# Patient Record
Sex: Female | Born: 1965
Health system: Southern US, Community
[De-identification: ages and names within clinical notes are randomized; demographics above are authoritative.]

## PROBLEM LIST (undated history)

## (undated) ENCOUNTER — Emergency Department (HOSPITAL_COMMUNITY): Discharge status: Absent without leave | Payer: Self-pay

## (undated) DIAGNOSIS — E119 Type 2 diabetes mellitus without complications: Secondary | ICD-10-CM

## (undated) DIAGNOSIS — T7840XA Allergy, unspecified, initial encounter: Secondary | ICD-10-CM

## (undated) DIAGNOSIS — F419 Anxiety disorder, unspecified: Secondary | ICD-10-CM

## (undated) DIAGNOSIS — F32A Depression, unspecified: Secondary | ICD-10-CM

## (undated) DIAGNOSIS — J45909 Unspecified asthma, uncomplicated: Secondary | ICD-10-CM

## (undated) DIAGNOSIS — F329 Major depressive disorder, single episode, unspecified: Secondary | ICD-10-CM

## (undated) HISTORY — DX: Allergy, unspecified, initial encounter: T78.40XA

## (undated) HISTORY — PX: TUBAL LIGATION: SHX77

## (undated) HISTORY — DX: Unspecified asthma, uncomplicated: J45.909

## (undated) HISTORY — DX: Depression, unspecified: F32.A

## (undated) HISTORY — PX: CHOLECYSTECTOMY: SHX55

## (undated) HISTORY — DX: Major depressive disorder, single episode, unspecified: F32.9

## (undated) HISTORY — DX: Anxiety disorder, unspecified: F41.9

---

## 1998-05-28 ENCOUNTER — Encounter: Admission: RE | Admit: 1998-05-28 | Discharge: 1998-05-28 | Payer: Self-pay | Admitting: *Deleted

## 1998-07-02 ENCOUNTER — Encounter: Admission: RE | Admit: 1998-07-02 | Discharge: 1998-07-02 | Payer: Self-pay | Admitting: *Deleted

## 2000-10-29 ENCOUNTER — Encounter: Payer: Self-pay | Admitting: Emergency Medicine

## 2000-10-29 ENCOUNTER — Emergency Department (HOSPITAL_COMMUNITY): Admission: EM | Admit: 2000-10-29 | Discharge: 2000-10-29 | Payer: Self-pay | Admitting: Emergency Medicine

## 2001-01-19 ENCOUNTER — Encounter: Payer: Self-pay | Admitting: Gastroenterology

## 2001-01-19 ENCOUNTER — Ambulatory Visit (HOSPITAL_COMMUNITY): Admission: RE | Admit: 2001-01-19 | Discharge: 2001-01-19 | Payer: Self-pay | Admitting: Gastroenterology

## 2001-02-25 ENCOUNTER — Encounter: Payer: Self-pay | Admitting: Anesthesiology

## 2001-02-28 ENCOUNTER — Encounter (INDEPENDENT_AMBULATORY_CARE_PROVIDER_SITE_OTHER): Payer: Self-pay

## 2001-02-28 ENCOUNTER — Observation Stay (HOSPITAL_COMMUNITY): Admission: RE | Admit: 2001-02-28 | Discharge: 2001-02-28 | Payer: Self-pay | Admitting: General Surgery

## 2001-02-28 ENCOUNTER — Encounter: Payer: Self-pay | Admitting: General Surgery

## 2005-05-02 ENCOUNTER — Ambulatory Visit: Payer: Self-pay | Admitting: Infectious Diseases

## 2005-05-02 ENCOUNTER — Inpatient Hospital Stay (HOSPITAL_COMMUNITY): Admission: AD | Admit: 2005-05-02 | Discharge: 2005-05-11 | Payer: Self-pay | Admitting: Orthopedic Surgery

## 2009-09-30 ENCOUNTER — Emergency Department (HOSPITAL_COMMUNITY): Admission: EM | Admit: 2009-09-30 | Discharge: 2009-09-30 | Payer: Self-pay | Admitting: Emergency Medicine

## 2010-10-16 ENCOUNTER — Emergency Department (HOSPITAL_COMMUNITY)
Admission: EM | Admit: 2010-10-16 | Discharge: 2010-10-16 | Disposition: A | Discharge status: Home / Self Care | Payer: Self-pay | Attending: Emergency Medicine | Admitting: Emergency Medicine

## 2010-10-16 DIAGNOSIS — N61 Mastitis without abscess: Secondary | ICD-10-CM | POA: Insufficient documentation

## 2010-10-16 DIAGNOSIS — F329 Major depressive disorder, single episode, unspecified: Secondary | ICD-10-CM | POA: Insufficient documentation

## 2010-10-16 DIAGNOSIS — F3289 Other specified depressive episodes: Secondary | ICD-10-CM | POA: Insufficient documentation

## 2010-10-16 DIAGNOSIS — Z79899 Other long term (current) drug therapy: Secondary | ICD-10-CM | POA: Insufficient documentation

## 2010-10-16 DIAGNOSIS — E669 Obesity, unspecified: Secondary | ICD-10-CM | POA: Insufficient documentation

## 2010-10-16 LAB — DIFFERENTIAL
Basophils Absolute: 0 10*3/uL (ref 0.0–0.1)
Basophils Relative: 0 % (ref 0–1)
Eosinophils Absolute: 0.3 10*3/uL (ref 0.0–0.7)
Eosinophils Relative: 3 % (ref 0–5)
Lymphocytes Relative: 28 % (ref 12–46)
Lymphs Abs: 2.9 10*3/uL (ref 0.7–4.0)
Monocytes Absolute: 0.5 10*3/uL (ref 0.1–1.0)
Monocytes Relative: 5 % (ref 3–12)
Neutro Abs: 6.5 10*3/uL (ref 1.7–7.7)
Neutrophils Relative %: 64 % (ref 43–77)
Smear Review: ADEQUATE

## 2010-10-16 LAB — CBC
HCT: 46.2 % — ABNORMAL HIGH (ref 36.0–46.0)
Hemoglobin: 16.3 g/dL — ABNORMAL HIGH (ref 12.0–15.0)
MCV: 87.8 fL (ref 78.0–100.0)
RDW: 12.9 % (ref 11.5–15.5)
WBC: 10.2 10*3/uL (ref 4.0–10.5)

## 2010-11-06 LAB — BASIC METABOLIC PANEL
BUN: 13 mg/dL (ref 6–23)
CO2: 25 mEq/L (ref 19–32)
Chloride: 105 mEq/L (ref 96–112)
GFR calc non Af Amer: >60 mL/min (ref >60)
Glucose, Bld: 114 mg/dL — ABNORMAL HIGH (ref 70–99)
Potassium: 3.8 mEq/L (ref 3.5–5.1)

## 2010-11-06 LAB — CBC
HCT: 47.1 % — ABNORMAL HIGH (ref 36.0–46.0)
MCHC: 34.5 g/dL (ref 30.0–36.0)
MCV: 91.1 fL (ref 78.0–100.0)
Platelets: 155 10*3/uL (ref 150–400)
RDW: 13 % (ref 11.5–15.5)

## 2010-11-06 LAB — WET PREP, GENITAL
Clue Cells Wet Prep HPF POC: NONE SEEN
Trich, Wet Prep: NONE SEEN
WBC, Wet Prep HPF POC: NONE SEEN

## 2010-11-06 LAB — DIFFERENTIAL
Basophils Absolute: 0.1 10*3/uL (ref 0.0–0.1)
Basophils Relative: 1 % (ref 0–1)
Eosinophils Absolute: 0.2 10*3/uL (ref 0.0–0.7)
Eosinophils Relative: 2 % (ref 0–5)
Monocytes Absolute: 0.5 10*3/uL (ref 0.1–1.0)

## 2010-11-06 LAB — HCG, SERUM, QUALITATIVE: Preg, Serum: NEGATIVE

## 2011-01-02 NOTE — Discharge Summary (Signed)
[name], [name]                ACCOUNT NO.:  [id number]   MEDICAL RECORD NO.:  [id number]          PATIENT TYPE:  INP   LOCATION:  1507                         FACILITY:  [facility]   PHYSICIAN:  Almedia Balls. Ranell Patrick, M.D. DATE OF BIRTH:  1965-09-02   DATE OF ADMISSION:  05/02/2005  DATE OF DISCHARGE:                                 DISCHARGE SUMMARY   ADMISSION DIAGNOSIS:  Right fifth toe cellulitis.   DISCHARGE DIAGNOSIS:  Right fifth toe cellulitis secondary to methicillin-  resistant staphylococcus aureus, status post incision and drainage.   BRIEF HISTORY:  Patient is a 45 year old female who came to our office  during an acute care clinic complaining about right foot pain, status post  injury.  It has been going on and worsening over the last several days.  The  patient has been put on Keflex and states that her symptoms have been  worsening.  She was offered to be admitted at that point, but she elected to  try home antibiotics at first.  She returned the next day to be admitted to  the hospital for worsening symptoms, where she had an I&D on follow-up  admission.   PROCEDURE:  Patient had an I&D of the right fifth toe on May 02, 2005.  The attending surgeon was Malon Kindle, M.D.  Spinal anesthesia was used.  She had no complications.  Estimated blood loss was minimal.  Fluid  replacement was 500 cc of crystalloid, and she did tolerate the procedure  well.  Cultures were taken.   HOSPITAL COURSE:  The patient presented to the hospital for worsening  symptoms, even on antibiotics, for her right fifth toe.  Patient was taken  to have the above-stated procedure on May 02, 2005.  Patient tolerated  the procedure well and after an adequate time in the post anesthesia care  unit, she was transferred up to the orthopedic floor.   On postoperative day #1, the patient complained about severe pain in the  right foot.  Patient was started on Unasyn to begin with for  broad-spectrum  activity.  We did monitor this for a day and a half, and the patient still  remained with erythema and edema in her forefoot, extending back down  towards her ankle, it was not resolving, even after the I&D.  Infectious  disease consult.  Dr. Roxan Hockey graciously consulted and also stated the  patient had a MRSA infection and for [place] to start her on vancomycin.  This  was completed.  Two days after starting on the vancomycin, she really did  not have much improvement.  Thus, we had a wound consult and whirlpool for  that right foot two days afterwards, which patient started noticing moderate  results, decrease in erythema and edema in that right foot.  The pain was  well controlled throughout her entire visit, and she was afebrile.  Her  white blood count stayed within normal limits, although her sed rate and C-  reactive protein were elevated slightly during her visit.   On September 25th, the patient had IV antibiotics for several days,  and  dressing change.  It was noted that she had no drainage.  Her foot had no  erythema or edema.  Neurovascularly, she was intact.  Thus, it was decided  to discharge home on doxycycline per the recommendations of Dr. Roxan Hockey.   DISCHARGE PLAN:  Patient will be discharged home on May 11, 2005.  Her  condition is stable.  Diet is regular.   Patient does have an allergy to [facility].   DISCHARGE MEDICATIONS:  1.  Singulair 10 mg p.o. daily.  2.  Claritin 10 mg p.o. daily.  3.  Tagamet 300 mg b.i.d. p.o.  4.  Zoloft 50 mg p.o. daily.  5.  Doxycycline 100 mg p.o. b.i.d.  6.  Percocet 1-2 tabs q.4-6h. p.r.n. pain.  7.  Periactin 4 mg t.i.d. p.r.n.   CONDITION:  Stable.   ACTIVITY:  Nonweightbearing on that right lower extremity with the walker.   She is to have home health nursing for dressing changes.      Thomas B. Dixon, P.A.    ______________________________  Almedia Balls. Ranell Patrick, M.D.    TBD/MEDQ  D:  05/11/2005  T:   05/11/2005  Job:  [id number]   cc:   Rockey Situ. Flavia Shipper., M.D.  Fax: [phone]

## 2011-01-02 NOTE — Op Note (Signed)
[name], [name]                ACCOUNT NO.:  [id number]   MEDICAL RECORD NO.:  [id number]          PATIENT TYPE:  INP   LOCATION:  1507                         [facility]ACILITY:  [facility]   PHYSICIAN:  Almedia Balls. Ranell Patrick, M.D. DATE OF BIRTH:  1966-06-04   DATE OF PROCEDURE:  05/02/2005  DATE OF DISCHARGE:                                 OPERATIVE REPORT   PREOPERATIVE DIAGNOSIS:  Right small toe infection.   POSTOPERATIVE DIAGNOSIS:  Right small toe infection.   PROCEDURE:  I&D right small toe.   SURGEON:  Almedia Balls. Ranell Patrick, M.D.   ASSISTANT:  None.   ANESTHESIA:  Spinal anesthesia was used.   ESTIMATED BLOOD LOSS:  Minimal.   FLUIDS REPLACED:  500 mL crystalloid.   INSTRUMENT COUNT:  Correct.   COMPLICATIONS:  None.   ANTIBIOTICS:  Perioperative antibiotics were given.   INDICATIONS FOR PROCEDURE:  The patient is a 45 year old female with a  progressive right small toe infection since stubbing her toe several days  ago. The patient presented to orthopedics with questionable bloody blister  versus an infection of the right small toe. She was placed on oral  antibiotics and placed nonweightbearing yesterday. She presents today,  September 16, with progressive erythema and infection which has failed to  respond to conservative measures including oral antibiotics and  immobilization. The patient presents now for operative I&D. Informed consent  was obtained.   DESCRIPTION OF PROCEDURE:  After an adequate level of anesthesia was  achieved, the patient was positioned supine on the operating table, the  right hip was bumped. The right lower leg was sterilely prepped and draped  in the usual fashion. We removed the overlying epidermis which had a fair  amount of pus underneath that over the small toe, this was on the outside  aspect of the small toe including the dorsal skin over to the toenail. Again  there was copious amounts of purulent material which was in a bolus pocket  which  appeared to be superficial to the dermis or deep skin. We cultured  that for aerobic, anaerobic and Gram stain. We thoroughly debrided any  nonviable tissue. What was left appeared to be hyperemic dermal tissue and a  healthy looking nail bed. We thoroughly irrigated the  wound with 500 mL of normal saline in a pulsatile fashion. Next, we dressed  the wound utilizing nonstick xeroform gauze followed by placement of 4 x 4's  in between each toe and then a bulky Kerlix around the small toe. A short  leg splint was placed, the patient was awakened and taken to the recovery  room in stable condition.           ______________________________  Almedia Balls Ranell Patrick, M.D.     SRN/MEDQ  D:  05/02/2005  T:  05/04/2005  Job:  [id number]

## 2011-01-02 NOTE — Op Note (Signed)
[place]  Patient:    [name]                       MRN: [id number] Proc. Date: 02/28/01 Adm. Date:  [id number] Disc. Date: [id number] Attending:  Glenna Fellows Tappan                           Operative Report  PREOPERATIVE DIAGNOSIS:  Biliary dyskinesia.  POSTOPERATIVE DIAGNOSIS:  Biliary dyskinesia.  OPERATION PERFORMED:  Laparoscopic cholecystectomy with intraoperative cholangiogram.  SURGEON:  Sharlet Salina T. Hoxworth, M.D.  ASSISTANT:  Donnie Coffin. Samuella Cota, M.D.  ANESTHESIA:  General.  INDICATIONS FOR PROCEDURE:  [name] is a 45 year old white female with persistent episodic epigastric and right upper quadrant abdominal pain.  She has had a thorough work-up including essentially esophagogastroduodenoscopy and gallbladder ultrasound.  Her pain, however, has continued and HIDA scan has shown a markedly reduced ejection fraction of 19%.  She is felt to likely have biliary dyskinesia and after discussion, we have elected to proceed with laparoscopic cholecystectomy with cholangiogram.  The nature of the procedure, its indications and risks of bleeding, infection, bile leak and bile duct injury and failure to relieve her symptoms were discussed and understood.  The patient is now brought to the operating room for this procedure.  DESCRIPTION OF PROCEDURE:  The patient was brought to the operating room and placed in supine position on the operating table and general endotracheal anesthesia was induced.  The abdomen was sterilely prepped and draped.  She received preoperative antibiotics.  Local anesthesia was used to infiltrate the trocar sites prior to the incisions.  A 1 cm incision was made at the umbilicus and dissection carried down to the midline fascia.  This was sharply incised for 1 cm and the peritoneum entered under direct vision.  A mattress suture of 0 Vicryl was placed and the Hasson trocar inserted.  A pneumoperitoneum  established.  Under direct vision, a 10 mm trocar was placed in the subxiphoid area and two 5 mm trocars along the right subcostal margin. The gallbladder was visualized and the fundus elevated up over the liver and the infundibulum retracted inferolaterally.  Fibrofatty tissue was stripped down off the neck of the gallbladder.  The distal gallbladder and Calots triangle were thoroughly dissected.  The cystic duct gallbladder junction was identified and dissected 360 degrees.  The cystic duct was clipped at the gallbladder junction.  Operative cholangiograms were then obtained through the cystic duct.  This showed good filling of normal intrahepatic, common hepatic and common bile ducts with free flow into the duodenum and no filling defects. Following this, the Cholangiocath was removed and the cystic duct was doubly clipped proximally and divided.  The cystic artery Calots triangle was divided between two proximal and one distal clip.  The gallbladder was then dissected free from its bed using hook cautery and removed intact through the umbilicus.  Complete hemostasis was assured in the gallbladder bed.  The right upper quadrant was irrigated and suctioned until clear.  Trocars were removed under direct vision and all CO2 evacuated from the peritoneal cavity.  The pursestring suture was secured at the umbilicus.  Skin incisions were closed with interrupted subcuticular 4-0 Monocryl and Steri-Strips.  Sponge, needle and instrument counts were correct.  Dry sterile dressings were applied.  The patient was taken to the recovery room in good condition. DD:  02/28/01 TD:  02/28/01  Job: [id number]

## 2011-08-31 ENCOUNTER — Ambulatory Visit (INDEPENDENT_AMBULATORY_CARE_PROVIDER_SITE_OTHER): Payer: Self-pay

## 2011-08-31 DIAGNOSIS — R059 Cough, unspecified: Secondary | ICD-10-CM

## 2011-08-31 DIAGNOSIS — R509 Fever, unspecified: Secondary | ICD-10-CM

## 2011-08-31 DIAGNOSIS — R05 Cough: Secondary | ICD-10-CM

## 2011-09-03 ENCOUNTER — Ambulatory Visit: Payer: Self-pay

## 2011-10-13 ENCOUNTER — Ambulatory Visit: Payer: Self-pay | Admitting: Internal Medicine

## 2011-10-13 ENCOUNTER — Ambulatory Visit: Payer: Self-pay

## 2011-10-13 VITALS — BP 133/83 | HR 90 | Temp 98.0°F | Resp 18 | Ht 65.5 in | Wt 244.0 lb

## 2011-10-13 DIAGNOSIS — R059 Cough, unspecified: Secondary | ICD-10-CM

## 2011-10-13 DIAGNOSIS — J9801 Acute bronchospasm: Secondary | ICD-10-CM

## 2011-10-13 DIAGNOSIS — T783XXA Angioneurotic edema, initial encounter: Secondary | ICD-10-CM

## 2011-10-13 DIAGNOSIS — Z720 Tobacco use: Secondary | ICD-10-CM

## 2011-10-13 DIAGNOSIS — R05 Cough: Secondary | ICD-10-CM

## 2011-10-13 LAB — POCT CBC
Granulocyte percent: 70.7 %G (ref 37–80)
HCT, POC: 47.5 % (ref 37.7–47.9)
Hemoglobin: 15.7 g/dL (ref 12.2–16.2)
Lymph, poc: 2.6 (ref 0.6–3.4)
MCHC: 33.1 g/dL (ref 31.8–35.4)
MCV: 91.1 fL (ref 80–97)
POC LYMPH PERCENT: 22.8 %L (ref 10–50)
RDW, POC: 14 %

## 2011-10-13 LAB — POCT SEDIMENTATION RATE: POCT SED RATE: 20 mm/hr (ref 0–22)

## 2011-10-13 MED ORDER — MOMETASONE FURO-FORMOTEROL FUM 100-5 MCG/ACT IN AERO: 2.0000 | INHALATION_SPRAY | Freq: Two times a day (BID) | RESPIRATORY_TRACT | Status: DC

## 2011-10-13 MED ORDER — METHYLPREDNISOLONE ACETATE 40 MG/ML IJ SUSP
80.0000 mg | Freq: Once | INTRAMUSCULAR | Status: AC
  Administered 2011-10-13: 80 mg via INTRAMUSCULAR

## 2011-10-13 MED ORDER — PREDNISONE 20 MG PO TABS: ORAL_TABLET | ORAL | Status: DC

## 2011-10-13 MED ORDER — EPINEPHRINE 0.3 MG/0.3ML IJ DEVI
0.3000 mg | Freq: Once | INTRAMUSCULAR | Status: AC
  Administered 2011-10-13: 0.3 mg via INTRAMUSCULAR

## 2011-10-13 MED ORDER — NON FORMULARY
10.0000 mg | Freq: Once | Status: AC
  Administered 2011-10-13: 10 mg via ORAL

## 2011-10-13 NOTE — Progress Notes (Signed)
  Subjective:    Patient ID: [name], female    DOB: November 15, 1965, 46 y.o.   MRN: [id number]  HPI46 year old with a history of labial and facial swelling over 24-36 hours There is no urticaria or pruritus Over the last few hours she has felt a tightness in her throat on swallowing but no respiratory distress There is no past history of this although she has had a history of urticaria There is no history of angioedema in the family that she is aware of She has had no unusual abdominal pain syndromes over the years There are no new medicines/  recent illnesses= She was treated for suspected right lower lobe pneumonia 5 weeks ago with doxycycline and responded fairly well. The radiologist subsequently interpreted the chest x-ray as no acute illness/Her husband reports continued cough but says she coughs all the time because she is a smoker and there is a long history of this cough/she also has had some problems with wheezing cough secondary to smoking //she is not on antihypertensives And there is no change in her current meds  Past history:  hepatitis C treated and resolved in 2006 History of asthma as a child with reactive airway as an adult History of IBS/status post cholecystectomy History of allergic rhinitis History of urticaria of uncertain etiology Obesity GERD Anxiety and depression    Review of SystemsHer cardiovascular system is negative GI negative GU negative      Objective:   Physical Exam Vital signs are within normal limits except for elevated BMI There is no tachypnea or increased work of breathing Her eyes are clear The face is obviously swollen and indurated and somewhat asymmetrical fashion/both lips are swollen and the left forehead and malar area are also swollen There are no anterior cervical nodes or thyroid enlargement There is mild expiratory forced expiratory wheezing bilaterally with rhonchi heard anteriorly There is no stridor The tongue and  posterior pharynx are not swollen Heart has a regular rhythm without murmurs rubs or gallops and the rate is normal The rest of the skin is clear without evidence of high dose or other rash   EpiPen 0.3 mg was given and re-observation 15 minutes showed a partial response She continues to have forced expiratory wheezing Depo-Medrol 80 mg IM given  Chest x-ray shows no active infiltrate or other abnormality CBC is 11,200 white cells  Recheck at 45 minutes post epinephrine shows response in the lips and forehead The left malar area remains tense and slightly tender and swollen       Assessment & Plan:  Problem #1 angioedema With a partial response to epinephrine it is difficult to say whether this is mast cell-mediated allergic reaction or whether it is bradykinin mediated reaction  Problem #2 reactive airway disease secondary to smoking with a history of underlying allergic disorders including asthma  She will Use dulera 100/5 2 puffs twice a day with coupon She will continue Zyrtec or similar She will start prednisone 60 mg for 2 days 40 mg for 2 days and 20 mg for 2 days She was warned about the insidious nature of nonallergic angioedema and the need for intravenous treatment if this progresses Pending labs include sedimentation rate, CRP, C3 and C4, C1 esterase inhibitor, and CMET She has alprazolam to use for the jitteriness caused by her medication Smoking cessation does not appear to be an option at this point  Closely guarded status needed

## 2011-10-13 NOTE — Progress Notes (Signed)
  Subjective:    Patient ID: [name], female    DOB: Dec 28, 1965, 46 y.o.   MRN: [id number]  HPI    Review of Systems     Objective:   Physical Exam    [facility] reading (PRIMARY) by  Dr.Jasiah Buntin== No acute changes     Assessment & Plan:

## 2011-10-14 LAB — COMPREHENSIVE METABOLIC PANEL
ALT: 10 U/L (ref 0–35)
CO2: 23 mEq/L (ref 19–32)
Calcium: 9.4 mg/dL (ref 8.4–10.5)
Chloride: 108 mEq/L (ref 96–112)
Glucose, Bld: 102 mg/dL — ABNORMAL HIGH (ref 70–99)
Sodium: 142 mEq/L (ref 135–145)
Total Bilirubin: 0.7 mg/dL (ref 0.3–1.2)
Total Protein: 6.8 g/dL (ref 6.0–8.3)

## 2011-10-14 LAB — C3 AND C4
C3 Complement: 173 mg/dL (ref 90–180)
C4 Complement: 32 mg/dL (ref 10–40)

## 2011-10-16 ENCOUNTER — Telehealth: Payer: Self-pay | Admitting: Internal Medicine

## 2011-10-16 NOTE — Telephone Encounter (Signed)
Please call and check status of angioedema Tell her labs are all normal so we think this was some type of allergic reaction. Add zyrtec daily for one month

## 2011-10-19 NOTE — Telephone Encounter (Signed)
Pt reports her swelling has completely resolved. Explained nl lab results and instructions from Dr Merla Riches. Pt agreed to take the Zyrtec for 1 mos.

## 2011-12-22 ENCOUNTER — Other Ambulatory Visit: Payer: Self-pay | Admitting: Internal Medicine

## 2012-05-19 ENCOUNTER — Other Ambulatory Visit: Payer: Self-pay | Admitting: Internal Medicine

## 2012-05-20 NOTE — Telephone Encounter (Signed)
Please get the paper record for review.

## 2012-06-12 ENCOUNTER — Ambulatory Visit: Payer: Self-pay | Admitting: Internal Medicine

## 2012-06-12 VITALS — BP 121/77 | HR 86 | Temp 98.0°F | Resp 16 | Ht 66.5 in | Wt 281.0 lb

## 2012-06-12 DIAGNOSIS — R062 Wheezing: Secondary | ICD-10-CM

## 2012-06-12 DIAGNOSIS — R059 Cough, unspecified: Secondary | ICD-10-CM

## 2012-06-12 DIAGNOSIS — R0989 Other specified symptoms and signs involving the circulatory and respiratory systems: Secondary | ICD-10-CM

## 2012-06-12 DIAGNOSIS — J9801 Acute bronchospasm: Secondary | ICD-10-CM | POA: Insufficient documentation

## 2012-06-12 DIAGNOSIS — G4700 Insomnia, unspecified: Secondary | ICD-10-CM

## 2012-06-12 DIAGNOSIS — F39 Unspecified mood [affective] disorder: Secondary | ICD-10-CM | POA: Insufficient documentation

## 2012-06-12 DIAGNOSIS — R05 Cough: Secondary | ICD-10-CM

## 2012-06-12 MED ORDER — FLUTICASONE-SALMETEROL 100-50 MCG/DOSE IN AEPB: 1.0000 | INHALATION_SPRAY | Freq: Two times a day (BID) | RESPIRATORY_TRACT | Status: DC

## 2012-06-12 MED ORDER — ALBUTEROL SULFATE HFA 108 (90 BASE) MCG/ACT IN AERS: 2.0000 | INHALATION_SPRAY | Freq: Four times a day (QID) | RESPIRATORY_TRACT | Status: DC | PRN

## 2012-06-12 MED ORDER — SERTRALINE HCL 100 MG PO TABS: 100.0000 mg | ORAL_TABLET | Freq: Every day | ORAL | Status: DC

## 2012-06-12 MED ORDER — AZITHROMYCIN 500 MG PO TABS: 500.0000 mg | ORAL_TABLET | Freq: Every day | ORAL | Status: DC

## 2012-06-12 MED ORDER — HYDROCODONE-ACETAMINOPHEN 7.5-500 MG/15ML PO SOLN: 5.0000 mL | Freq: Four times a day (QID) | ORAL | Status: DC | PRN

## 2012-06-12 MED ORDER — ALPRAZOLAM 1 MG PO TABS: 1.0000 mg | ORAL_TABLET | Freq: Every evening | ORAL | Status: DC | PRN

## 2012-06-12 MED ORDER — IPRATROPIUM BROMIDE 0.02 % IN SOLN
0.5000 mg | Freq: Once | RESPIRATORY_TRACT | Status: AC
  Administered 2012-06-12: 0.5 mg via RESPIRATORY_TRACT

## 2012-06-12 MED ORDER — ALBUTEROL SULFATE (2.5 MG/3ML) 0.083% IN NEBU
2.5000 mg | INHALATION_SOLUTION | Freq: Once | RESPIRATORY_TRACT | Status: AC
  Administered 2012-06-12: 2.5 mg via RESPIRATORY_TRACT

## 2012-06-12 MED ORDER — PREDNISONE 10 MG PO TABS: ORAL_TABLET | ORAL | Status: DC

## 2012-06-12 NOTE — Progress Notes (Signed)
  Subjective:    Patient ID: [name], female    DOB: [birth year], 46 y.o.   MRN: [id number]  HPI Has attacks of wheezing 2x per year. No fever, no chest pain. Is sob. Stopped using advair due to cost.   Review of Systems obesity    Objective:   Physical Exam  Constitutional: She appears well-nourished. No distress.  HENT:  Right Ear: External ear normal.  Left Ear: External ear normal.  Nose: Nose normal.  Mouth/Throat: Oropharynx is clear and moist.  Eyes: EOM are normal.  Pulmonary/Chest: No accessory muscle usage. Tachypnea noted. No respiratory distress. She has wheezes in the right upper field, the right middle field, the right lower field, the left upper field, the left middle field and the left lower field. She has rhonchi.       PFR 170 l/min  Oximetry 95%  Give neb   Obese       Assessment & Plan:

## 2012-06-12 NOTE — Patient Instructions (Addendum)
Chronic Asthmatic Bronchitis Chronic asthmatic bronchitis is often a complication of frequent asthma and/or bronchitis. After a long enough period of time, the continual airflow blockage is present in spite of treatment for asthma. The medications that used to treat asthma no longer work. The symptoms of chronic bronchitis may also be present. Bronchitis is an inflammation of the breathing tubules in the lungs. The combination of asthma, chronic bronchitis, and emphysema all affect the small breathing tubules (bronchial tree) in our lungs. It is a common condition. The problems from each are similar and overlap with each other so are sometimes hard to diagnose. When the asthma and bronchitis are combined, there is usually inflammation and infection. The small bronchial tubes produce more mucus. This blocks the airways and makes breathing harder. Usually this process is caused more by external irritants than infection. Smokers with chronic bronchitis are at a greater risk to develop asthmatic bronchitis. CAUSES   Why some people with asthma go on to develop chronic asthmatic bronchitis is not known. Smoking and environmental toxins or allergens seem to play a role. There are wide differences in who is susceptible.  Abnormalities of the small airways may develop in persons with persistent asthma. Asthmatics can be uncommonly subject to the effects of smoking. Asthma is also found associated with a number of other diseases. SYMPTOMS  Asthma, chronic bronchitis, and emphysema all cause symptoms of cough, wheezing, shortness of breath, and recurring infections. There may also be chest discomfort. All of the above symptoms happen more often in chronic asthmatic bronchitis. DIAGNOSIS   Asthma, chronic bronchitis, and emphysema all affect the entire bronchial tree. This makes it difficult on exam to tell them apart. Other tests of the lungs are done to prove a diagnosis. These are called pulmonary function  tests. TREATMENT   The asthmatic condition itself must always be treated.  Infection can be treated with antibiotics (medications to kill germs).  Serious infections may require hospitalization. These can include pneumonia, sinus infections, and acute bronchitis. HOME CARE INSTRUCTIONS  Use prescription medications as ordered by your caregiver.  Avoid pollen, dust, animal dander, molds, smoke, and other things that cause attacks at home and at work.  You may have fewer attacks if you decrease dust in your home. Electrostatic air cleaners may help.  It may help to replace your pillows or mattress with materials less likely to cause allergies.  If you are not on fluid restriction, drink 8 to 10 glasses of water each day.  Discuss possible exercise routines with your caregiver.  If animal dander is the cause of asthma, you may need to get rid of pets.  It is important that you:  Become educated about your medical condition.  Participate in maintaining wellness.  Seek medical care promptly or immediately as indicated below.  Delay in seeking medical attention could cause permanent injury and may be a risk to your life. SEEK MEDICAL CARE IF  You have wheezing and shortness of breath even if taking medicine to prevent attacks.  An oral temperature above 102 F (38.9 C)  You have muscle aches, chest pain, or thickening of sputum.  Your sputum changes from clear or white to yellow, green, gray, or bloody.  You have any problems that may be related to the medicine you are taking (such as a rash, itching, swelling, or trouble breathing). SEEK IMMEDIATE MEDICAL CARE IF:  Your usual medicines do not stop your wheezing.  There is increased coughing and/or shortness of breath.  You   have increased difficulty breathing. MAKE SURE YOU:   Understand these instructions.  Will watch your condition.  Will get help right away if you are not doing well or get worse. Document  Released: 05/21/2006 Document Revised: 10/26/2011 Document Reviewed: 07/19/2007 [employer] Patient Information 2013 [employer].  

## 2012-06-13 ENCOUNTER — Encounter: Payer: Self-pay | Admitting: Radiology

## 2012-06-13 ENCOUNTER — Encounter: Payer: Self-pay | Admitting: Internal Medicine

## 2012-06-13 ENCOUNTER — Telehealth: Payer: Self-pay

## 2012-06-13 NOTE — Telephone Encounter (Signed)
Printed work note. Pt  Advised.

## 2012-06-13 NOTE — Telephone Encounter (Signed)
Pt states she forgot to get out of work note last night. Needs it for yesterday (sunday 06/12/12) thru this Wednesday (06/15/12). States she is off work today and tomorrow and would like the note thru this Wednesday in case she is not well enough to return to work.   States husband will pick it up Best: [phone]  bf

## 2012-06-23 ENCOUNTER — Other Ambulatory Visit: Payer: Self-pay | Admitting: *Deleted

## 2012-06-23 MED ORDER — HYDROCODONE-ACETAMINOPHEN 7.5-325 MG/15ML PO SOLN: ORAL | Status: DC

## 2012-07-22 ENCOUNTER — Other Ambulatory Visit: Payer: Self-pay | Admitting: Internal Medicine

## 2012-08-26 ENCOUNTER — Encounter: Payer: Self-pay | Admitting: Internal Medicine

## 2013-01-16 ENCOUNTER — Ambulatory Visit: Payer: BC Managed Care – PPO

## 2013-01-16 ENCOUNTER — Ambulatory Visit (INDEPENDENT_AMBULATORY_CARE_PROVIDER_SITE_OTHER): Payer: BC Managed Care – PPO | Admitting: Family Medicine

## 2013-01-16 VITALS — BP 120/80 | HR 82 | Temp 97.8°F | Resp 16 | Ht 65.5 in | Wt 291.0 lb

## 2013-01-16 DIAGNOSIS — R062 Wheezing: Secondary | ICD-10-CM

## 2013-01-16 DIAGNOSIS — J45901 Unspecified asthma with (acute) exacerbation: Secondary | ICD-10-CM

## 2013-01-16 DIAGNOSIS — J4521 Mild intermittent asthma with (acute) exacerbation: Secondary | ICD-10-CM

## 2013-01-16 MED ORDER — DOXYCYCLINE HYCLATE 100 MG PO TABS: 100.0000 mg | ORAL_TABLET | Freq: Two times a day (BID) | ORAL | Status: DC

## 2013-01-16 MED ORDER — ALBUTEROL SULFATE HFA 108 (90 BASE) MCG/ACT IN AERS: INHALATION_SPRAY | RESPIRATORY_TRACT | Status: DC

## 2013-01-16 MED ORDER — ALBUTEROL SULFATE (2.5 MG/3ML) 0.083% IN NEBU
2.5000 mg | INHALATION_SOLUTION | Freq: Once | RESPIRATORY_TRACT | Status: AC
  Administered 2013-01-16: 2.5 mg via RESPIRATORY_TRACT

## 2013-01-16 MED ORDER — PREDNISONE 20 MG PO TABS: ORAL_TABLET | ORAL | Status: DC

## 2013-01-16 NOTE — Progress Notes (Signed)
[facility] [address] [place] [address] [phone]  Date:  01/16/2013   Name:  [name]   DOB:  September 09, 1965   MRN:  [id number]  PCP:  Tally Due, MD    Chief Complaint: URI and Cough   History of Present Illness:  [name] is a 47 y.o. very pleasant female patient who presents with the following:  Last seen here in the fall with cough and wheezing. She tends to get an exacerbation of her asthma twice a year.She has noted return of cough and wheezing typical of these exacerbations yesterday- it came on more quickly than usual. The cough is productive- when lying down she has a lot of mucus in her throat and it is hard to breathe.      She has not had fever, but has had chills and aches.   She also has runny stuffy nose and sneezing.   No ST No GI symptoms She has tried some decongestant/ clariting D  She is out of her albuterol- her current inhaler is empty.    She really cannot afford the advair and has not used it in some time.  Even when well she needs her albuterol daily.  She has tried an inhaled steroid in the past but does not want to take this due to developing oral thrush before.    States no menses in several years.  H/o BTL.    Patient Active Problem List   Diagnosis Date Noted  . Mood disorder 06/12/2012  . Bronchospasm 06/12/2012    Past Medical History  Diagnosis Date  . Allergy   . Depression   . Asthma   . Anxiety     Past Surgical History  Procedure Laterality Date  . Cholecystectomy    . Tubal ligation      History  Substance Use Topics  . Smoking status: Current Every Day Smoker -- 1.00 packs/day    Types: Cigarettes  . Smokeless tobacco: Not on file  . Alcohol Use: No    Family History  Problem Relation Age of Onset  . Stroke Mother   . Heart disease Father     Allergies  Allergen Reactions  . Codeine   . Guaifenesin Hives    Medication list has been reviewed and  updated.  Current Outpatient Prescriptions on File Prior to Visit  Medication Sig Dispense Refill  . albuterol (PROVENTIL HFA;VENTOLIN HFA) 108 (90 BASE) MCG/ACT inhaler Inhale 2 puffs into the lungs as needed.      Marland Kitchen albuterol (PROVENTIL HFA;VENTOLIN HFA) 108 (90 BASE) MCG/ACT inhaler Inhale 2 puffs into the lungs every 6 (six) hours as needed for wheezing.  1 Inhaler  0  . ALPRAZolam (XANAX) 1 MG tablet Take 1 tablet (1 mg total) by mouth at bedtime as needed for sleep (one or two po prn slleep).  60 tablet  1  . sertraline (ZOLOFT) 100 MG tablet Take 1 tablet (100 mg total) by mouth daily.  90 tablet  3  . azithromycin (ZITHROMAX) 500 MG tablet Take 1 tablet (500 mg total) by mouth daily.  5 tablet  0  . Fluticasone-Salmeterol (ADVAIR) 100-50 MCG/DOSE AEPB Inhale 1 puff into the lungs 2 (two) times daily.  1 each  3  . hydrocodone-acetaminophen (HYCET) 7.5-325 MG/15ML solution Take 1 teaspoonful by mouth every 6 hours as needed  240 mL  0  . HYDROcodone-acetaminophen (LORTAB) 7.5-500 MG/15ML solution Take 5 mLs by mouth every 6 (  six) hours as needed for pain.  240 mL  0  . predniSONE (DELTASONE) 10 MG tablet Take 6-5-4-3-2-1 po pc for breathing  21 tablet  0  . VENTOLIN HFA 108 (90 BASE) MCG/ACT inhaler INHALE TWO PUFFS BY MOUTH 4 TIMES DAILY AS NEEDED  18 g  5   No current facility-administered medications on file prior to visit.    Review of Systems:  As per HPI- otherwise negative. Positive for wheezing.    Physical Examination: Filed Vitals:   01/16/13 1541  BP: 120/80  Pulse: 82  Temp: 97.8 F (36.6 C)  Resp: 16   Filed Vitals:   01/16/13 1541  Height: 5' 5.5" (1.664 m)  Weight: 291 lb (131.997 kg)   Body mass index is 47.67 kg/(m^2). Ideal Body Weight: Weight in (lb) to have BMI = 25: 152.2  GEN: WDWN, NAD, Non-toxic, A & O x 3, obese HEENT: Atraumatic, Normocephalic. Neck supple. No masses, No LAD.  Bilateral TM wnl, oropharynx normal.  PEERL,EOMI.    Ears and  Nose: No external deformity. CV: RRR, No M/G/R. No JVD. No thrill. No extra heart sounds. PULM: CTA B, no wheezes, crackles, rhonchi. No retractions. No resp. distress. No accessory muscle use. ABD: S, NT, ND, +BS. No rebound. No HSM. EXTR: No c/c/e NEURO Normal gait.  PSYCH: Normally interactive. Conversant. Not depressed or anxious appearing.  Calm demeanor.   [facility] reading (PRIMARY) by  Dr. Patsy Lager. CXR: mild infiltrate right, no failure  Treated with an albuterol neb- reduced wheezing and pulse O2 to 95%. She felt better.   Assessment and Plan: Wheezing - Plan: albuterol (PROVENTIL) (2.5 MG/3ML) 0.083% nebulizer solution 2.5 mg, DG Chest 2 View, albuterol (VENTOLIN HFA) 108 (90 BASE) MCG/ACT inhaler  Asthmatic bronchitis, mild intermittent, with acute exacerbation - Plan: albuterol (VENTOLIN HFA) 108 (90 BASE) MCG/ACT inhaler, predniSONE (DELTASONE) 20 MG tablet, doxycycline (VIBRA-TABS) 100 MG tablet  Exacerbation of asthma/ wheezing with bronchitis.  Will treat with doxycycline, oral prednisone, refilled her albuterol.  Close follow- up if not better.  She will follow- up if not better soon, Sooner if worse.    Pt does not like to use inhaled steroids due to prior history of thrush with same.  Will defer this for now.   Meds ordered this encounter  Medications  . albuterol (PROVENTIL) (2.5 MG/3ML) 0.083% nebulizer solution 2.5 mg    Sig:   . albuterol (VENTOLIN HFA) 108 (90 BASE) MCG/ACT inhaler    Sig: INHALE TWO PUFFS BY MOUTH 4 TIMES DAILY AS NEEDED.  Pt prefers ventolin brand    Dispense:  2 each    Refill:  6  . predniSONE (DELTASONE) 20 MG tablet    Sig: Take 2 pills a day for 3 days and then 1 pill a day for 3 days    Dispense:  9 tablet    Refill:  0  . doxycycline (VIBRA-TABS) 100 MG tablet    Sig: Take 1 tablet (100 mg total) by mouth 2 (two) times daily.    Dispense:  20 tablet    Refill:  0    Signed Abbe Amsterdam, MD

## 2013-01-16 NOTE — Patient Instructions (Addendum)
  Use the oral prednisone and antibiotic (doxycycline) as directed.  Use the albuterol inhaler as needed for wheezing and coughing.   Let me know if you are not better in the next few days- Sooner if worse.

## 2013-01-17 ENCOUNTER — Encounter: Payer: Self-pay | Admitting: Internal Medicine

## 2013-01-18 ENCOUNTER — Telehealth: Payer: Self-pay

## 2013-01-18 NOTE — Telephone Encounter (Signed)
PT STATES SHE NEED AN OOW NOTE FOR TOMORROW AND Friday. PLEASE CALL [phone] WHEN READY AND SHE WILL PICK UP

## 2013-01-18 NOTE — Telephone Encounter (Signed)
Patient advised.

## 2013-01-18 NOTE — Telephone Encounter (Signed)
Note provided. If not able to return after Friday she should return to clinic. Called her to advise. Line busy.

## 2013-01-22 ENCOUNTER — Ambulatory Visit (INDEPENDENT_AMBULATORY_CARE_PROVIDER_SITE_OTHER): Payer: BC Managed Care – PPO | Admitting: Emergency Medicine

## 2013-01-22 VITALS — BP 124/78 | HR 101 | Temp 98.0°F | Resp 20 | Ht 65.5 in | Wt 285.0 lb

## 2013-01-22 DIAGNOSIS — R05 Cough: Secondary | ICD-10-CM

## 2013-01-22 DIAGNOSIS — R0602 Shortness of breath: Secondary | ICD-10-CM

## 2013-01-22 DIAGNOSIS — F17200 Nicotine dependence, unspecified, uncomplicated: Secondary | ICD-10-CM | POA: Insufficient documentation

## 2013-01-22 DIAGNOSIS — J4521 Mild intermittent asthma with (acute) exacerbation: Secondary | ICD-10-CM

## 2013-01-22 DIAGNOSIS — R0789 Other chest pain: Secondary | ICD-10-CM

## 2013-01-22 DIAGNOSIS — R059 Cough, unspecified: Secondary | ICD-10-CM

## 2013-01-22 DIAGNOSIS — R112 Nausea with vomiting, unspecified: Secondary | ICD-10-CM

## 2013-01-22 DIAGNOSIS — J45901 Unspecified asthma with (acute) exacerbation: Secondary | ICD-10-CM

## 2013-01-22 DIAGNOSIS — IMO0001 Reserved for inherently not codable concepts without codable children: Secondary | ICD-10-CM | POA: Insufficient documentation

## 2013-01-22 MED ORDER — ONDANSETRON 4 MG PO TBDP
8.0000 mg | ORAL_TABLET | Freq: Once | ORAL | Status: AC
  Administered 2013-01-22: 8 mg via ORAL

## 2013-01-22 MED ORDER — ONDANSETRON 8 MG PO TBDP: 8.0000 mg | ORAL_TABLET | Freq: Three times a day (TID) | ORAL | Status: DC | PRN

## 2013-01-22 MED ORDER — PREDNISONE 20 MG PO TABS: ORAL_TABLET | ORAL | Status: DC

## 2013-01-22 MED ORDER — BUDESONIDE-FORMOTEROL FUMARATE 160-4.5 MCG/ACT IN AERO: 2.0000 | INHALATION_SPRAY | Freq: Two times a day (BID) | RESPIRATORY_TRACT | Status: DC

## 2013-01-22 MED ORDER — IPRATROPIUM BROMIDE 0.02 % IN SOLN
0.5000 mg | Freq: Once | RESPIRATORY_TRACT | Status: AC
  Administered 2013-01-22: 0.5 mg via RESPIRATORY_TRACT

## 2013-01-22 MED ORDER — ALBUTEROL SULFATE (2.5 MG/3ML) 0.083% IN NEBU
2.5000 mg | INHALATION_SOLUTION | Freq: Once | RESPIRATORY_TRACT | Status: AC
  Administered 2013-01-22: 2.5 mg via RESPIRATORY_TRACT

## 2013-01-22 NOTE — Progress Notes (Signed)
  Subjective:    Patient ID: [name], female    DOB: [birth year], 47 y.o.   MRN: [id number]  HPI Pt here c/o nausea after taking doxycyline, and also diarrhea feels hot, cough with blood from chest. Smokes .5 pack a day.  uses an inhaler in 3 weeks.  Pt started using prednisone and this seemed to help but now she feels tight in her chest exam and has difficulty breathing. She occasionally coughs up a bloody type phlegm. This is not unusual for her. Is improved with use of her inhaler but she goes through an inhaler approximately every 3 weeks .   Review of Systems     Objective:   Physical Exam patient is alert cooperative she is not in distress. Her neck is supple. Chest exam reveals bilateral inspiratory and expiratory wheezes. She has good air exchange. Her heart is regular rate without murmurs. The abdomen is obese there are no areas of tenderness and no masses are felt . Posttreatment peak flow is 290. She looks better. She did vomit once here in the office. EKG        Assessment & Plan:  Patient having vomiting and diarrhea. She may have picked a GI illness when she was here the other day. She has been on steroids so I did not check a CBC. We'll treat with Zofran, prednisone, and Advair twice a day because she can now afford this she is to continue her albuterol when necessary.

## 2013-01-22 NOTE — Patient Instructions (Signed)
Smoking Cessation Quitting smoking is important to your health and has many advantages. However, it is not always easy to quit since nicotine is a very addictive drug. Often times, people try 3 times or more before being able to quit. This document explains the best ways for you to prepare to quit smoking. Quitting takes hard work and a lot of effort, but you can do it. ADVANTAGES OF QUITTING SMOKING  You will live longer, feel better, and live better.  Your body will feel the impact of quitting smoking almost immediately.  Within 20 minutes, blood pressure decreases. Your pulse returns to its normal level.  After 8 hours, carbon monoxide levels in the blood return to normal. Your oxygen level increases.  After 24 hours, the chance of having a heart attack starts to decrease. Your breath, hair, and body stop smelling like smoke.  After 48 hours, damaged nerve endings begin to recover. Your sense of taste and smell improve.  After 72 hours, the body is virtually free of nicotine. Your bronchial tubes relax and breathing becomes easier.  After 2 to 12 weeks, lungs can hold more air. Exercise becomes easier and circulation improves.  The risk of having a heart attack, stroke, cancer, or lung disease is greatly reduced.  After 1 year, the risk of coronary heart disease is cut in half.  After 5 years, the risk of stroke falls to the same as a nonsmoker.  After 10 years, the risk of lung cancer is cut in half and the risk of other cancers decreases significantly.  After 15 years, the risk of coronary heart disease drops, usually to the level of a nonsmoker.  If you are pregnant, quitting smoking will improve your chances of having a healthy baby.  The people you live with, especially any children, will be healthier.  You will have extra money to spend on things other than cigarettes. QUESTIONS TO THINK ABOUT BEFORE ATTEMPTING TO QUIT You may want to talk about your answers with your  caregiver.  Why do you want to quit?  If you tried to quit in the past, what helped and what did not?  What will be the most difficult situations for you after you quit? How will you plan to handle them?  Who can help you through the tough times? Your family? Friends? A caregiver?  What pleasures do you get from smoking? What ways can you still get pleasure if you quit? Here are some questions to ask your caregiver:  How can you help me to be successful at quitting?  What medicine do you think would be best for me and how should I take it?  What should I do if I need more help?  What is smoking withdrawal like? How can I get information on withdrawal? GET READY  Set a quit date.  Change your environment by getting rid of all cigarettes, ashtrays, matches, and lighters in your home, car, or work. Do not let people smoke in your home.  Review your past attempts to quit. Think about what worked and what did not. GET SUPPORT AND ENCOURAGEMENT You have a better chance of being successful if you have help. You can get support in many ways.  Tell your family, friends, and co-workers that you are going to quit and need their support. Ask them not to smoke around you.  Get individual, group, or telephone counseling and support. Programs are available at local hospitals and health centers. Call your local health department for   information about programs in your area.  Spiritual beliefs and practices may help some smokers quit.  Download a "quit meter" on your computer to keep track of quit statistics, such as how long you have gone without smoking, cigarettes not smoked, and money saved.  Get a self-help book about quitting smoking and staying off of tobacco. LEARN NEW SKILLS AND BEHAVIORS  Distract yourself from urges to smoke. Talk to someone, go for a walk, or occupy your time with a task.  Change your normal routine. Take a different route to work. Drink tea instead of coffee.  Eat breakfast in a different place.  Reduce your stress. Take a hot bath, exercise, or read a book.  Plan something enjoyable to do every day. Reward yourself for not smoking.  Explore interactive web-based programs that specialize in helping you quit. GET MEDICINE AND USE IT CORRECTLY Medicines can help you stop smoking and decrease the urge to smoke. Combining medicine with the above behavioral methods and support can greatly increase your chances of successfully quitting smoking.  Nicotine replacement therapy helps deliver nicotine to your body without the negative effects and risks of smoking. Nicotine replacement therapy includes nicotine gum, lozenges, inhalers, nasal sprays, and skin patches. Some may be available over-the-counter and others require a prescription.  Antidepressant medicine helps people abstain from smoking, but how this works is unknown. This medicine is available by prescription.  Nicotinic receptor partial agonist medicine simulates the effect of nicotine in your brain. This medicine is available by prescription. Ask your caregiver for advice about which medicines to use and how to use them based on your health history. Your caregiver will tell you what side effects to look out for if you choose to be on a medicine or therapy. Carefully read the information on the package. Do not use any other product containing nicotine while using a nicotine replacement product.  RELAPSE OR DIFFICULT SITUATIONS Most relapses occur within the first 3 months after quitting. Do not be discouraged if you start smoking again. Remember, most people try several times before finally quitting. You may have symptoms of withdrawal because your body is used to nicotine. You may crave cigarettes, be irritable, feel very hungry, cough often, get headaches, or have difficulty concentrating. The withdrawal symptoms are only temporary. They are strongest when you first quit, but they will go away within  10 14 days. To reduce the chances of relapse, try to:  Avoid drinking alcohol. Drinking lowers your chances of successfully quitting.  Reduce the amount of caffeine you consume. Once you quit smoking, the amount of caffeine in your body increases and can give you symptoms, such as a rapid heartbeat, sweating, and anxiety.  Avoid smokers because they can make you want to smoke.  Do not let weight gain distract you. Many smokers will gain weight when they quit, usually less than 10 pounds. Eat a healthy diet and stay active. You can always lose the weight gained after you quit.  Find ways to improve your mood other than smoking. FOR MORE INFORMATION  www.smokefree.gov  Document Released: 07/28/2001 Document Revised: 02/02/2012 Document Reviewed: 11/12/2011 [employer] Patient Information 2014 [employer].  

## 2013-07-29 ENCOUNTER — Other Ambulatory Visit: Payer: Self-pay | Admitting: Physician Assistant

## 2013-09-02 ENCOUNTER — Ambulatory Visit: Payer: Self-pay | Admitting: Family Medicine

## 2013-09-02 VITALS — BP 146/94 | HR 93 | Temp 98.1°F | Resp 20 | Ht 66.0 in | Wt 290.0 lb

## 2013-09-02 DIAGNOSIS — F39 Unspecified mood [affective] disorder: Secondary | ICD-10-CM

## 2013-09-02 DIAGNOSIS — J45909 Unspecified asthma, uncomplicated: Secondary | ICD-10-CM

## 2013-09-02 DIAGNOSIS — G4700 Insomnia, unspecified: Secondary | ICD-10-CM

## 2013-09-02 DIAGNOSIS — R062 Wheezing: Secondary | ICD-10-CM

## 2013-09-02 DIAGNOSIS — R05 Cough: Secondary | ICD-10-CM

## 2013-09-02 DIAGNOSIS — R059 Cough, unspecified: Secondary | ICD-10-CM

## 2013-09-02 DIAGNOSIS — J209 Acute bronchitis, unspecified: Secondary | ICD-10-CM

## 2013-09-02 DIAGNOSIS — R0989 Other specified symptoms and signs involving the circulatory and respiratory systems: Secondary | ICD-10-CM

## 2013-09-02 MED ORDER — ALPRAZOLAM 1 MG PO TABS: 1.0000 mg | ORAL_TABLET | Freq: Every evening | ORAL | Status: DC | PRN

## 2013-09-02 MED ORDER — SERTRALINE HCL 100 MG PO TABS: 100.0000 mg | ORAL_TABLET | Freq: Every day | ORAL | Status: DC

## 2013-09-02 MED ORDER — SULFAMETHOXAZOLE-TMP DS 800-160 MG PO TABS: 1.0000 | ORAL_TABLET | Freq: Two times a day (BID) | ORAL | Status: DC

## 2013-09-02 MED ORDER — PREDNISONE 20 MG PO TABS: ORAL_TABLET | ORAL | Status: DC

## 2013-09-02 MED ORDER — ALBUTEROL SULFATE HFA 108 (90 BASE) MCG/ACT IN AERS: INHALATION_SPRAY | RESPIRATORY_TRACT | Status: DC

## 2013-09-02 MED ORDER — BENZONATATE 100 MG PO CAPS: 100.0000 mg | ORAL_CAPSULE | Freq: Three times a day (TID) | ORAL | Status: DC | PRN

## 2013-09-02 NOTE — Progress Notes (Signed)
48 year old woman comes in with her partner because of several days of progressive cough, wheezing, and mild shortness of breath. She's had no fever.  She's also out of her current medications.  Objective: Patient in no acute distress but she is slightly raspy. She is articulate and alert HEENT: Unremarkable with exception of marked mucopurulent discharge summer which is bloody  In the nasal passages Neck: Supple no adenopathy Chest: Diffuse wheezes bilaterally Heart: Regular without tachycardia Extremities: No edema Skin: No rash or suspicious lesions  Assessment: Acute sinusitis and bronchitis  Plan:Wheezing - Plan: predniSONE (DELTASONE) 20 MG tablet, benzonatate (TESSALON) 100 MG capsule, albuterol (VENTOLIN HFA) 108 (90 BASE) MCG/ACT inhaler, sulfamethoxazole-trimethoprim (BACTRIM DS) 800-160 MG per tablet, sertraline (ZOLOFT) 100 MG tablet, ALPRAZolam (XANAX) 1 MG tablet  Acute bronchitis - Plan: predniSONE (DELTASONE) 20 MG tablet, benzonatate (TESSALON) 100 MG capsule, albuterol (VENTOLIN HFA) 108 (90 BASE) MCG/ACT inhaler, sulfamethoxazole-trimethoprim (BACTRIM DS) 800-160 MG per tablet  Cough - Plan: sertraline (ZOLOFT) 100 MG tablet, ALPRAZolam (XANAX) 1 MG tablet  Chest congestion - Plan: sertraline (ZOLOFT) 100 MG tablet, ALPRAZolam (XANAX) 1 MG tablet  Insomnia - Plan: sertraline (ZOLOFT) 100 MG tablet, ALPRAZolam (XANAX) 1 MG tablet  Mood disorder - Plan: sertraline (ZOLOFT) 100 MG tablet, ALPRAZolam (XANAX) 1 MG tablet  Signed, Elvina SidleKurt Asucena Galer, MD

## 2014-01-10 ENCOUNTER — Other Ambulatory Visit: Payer: Self-pay | Admitting: Family Medicine

## 2014-01-11 NOTE — Telephone Encounter (Signed)
Faxed

## 2014-04-15 ENCOUNTER — Other Ambulatory Visit: Payer: Self-pay | Admitting: Family Medicine

## 2014-05-16 ENCOUNTER — Ambulatory Visit (INDEPENDENT_AMBULATORY_CARE_PROVIDER_SITE_OTHER): Payer: Self-pay | Admitting: Family Medicine

## 2014-05-16 VITALS — BP 112/72 | HR 98 | Temp 98.4°F | Resp 16 | Ht 65.0 in | Wt 294.4 lb

## 2014-05-16 DIAGNOSIS — R059 Cough, unspecified: Secondary | ICD-10-CM

## 2014-05-16 DIAGNOSIS — R062 Wheezing: Secondary | ICD-10-CM

## 2014-05-16 DIAGNOSIS — G4700 Insomnia, unspecified: Secondary | ICD-10-CM

## 2014-05-16 DIAGNOSIS — R0989 Other specified symptoms and signs involving the circulatory and respiratory systems: Secondary | ICD-10-CM

## 2014-05-16 DIAGNOSIS — F39 Unspecified mood [affective] disorder: Secondary | ICD-10-CM

## 2014-05-16 DIAGNOSIS — R05 Cough: Secondary | ICD-10-CM

## 2014-05-16 MED ORDER — MONTELUKAST SODIUM 10 MG PO TABS: 10.0000 mg | ORAL_TABLET | Freq: Every day | ORAL | Status: DC

## 2014-05-16 MED ORDER — ALPRAZOLAM 1 MG PO TABS: ORAL_TABLET | ORAL | Status: DC

## 2014-05-16 MED ORDER — SERTRALINE HCL 100 MG PO TABS: 100.0000 mg | ORAL_TABLET | Freq: Every day | ORAL | Status: DC

## 2014-05-16 MED ORDER — ALBUTEROL SULFATE HFA 108 (90 BASE) MCG/ACT IN AERS: INHALATION_SPRAY | RESPIRATORY_TRACT | Status: DC

## 2014-05-16 NOTE — Patient Instructions (Signed)
Please think about quitting smoking.  This will save you a lot of money and trouble in the long run!   Otherwise continue your zoloft and xanax as needed, and use the albuterol as needed.  You can also try singulair for your breathing if not too expensive.    Good to see you today!  I am sorry about the loss of your dad.  Take care and please see us in about 6 months

## 2014-05-16 NOTE — Progress Notes (Signed)
[facility] [address], [place] [address] [phone]  Date:  05/16/2014   Name:  [name]   DOB:  1965/10/13   MRN:  [id number]  PCP:  Tally Due, MD    Chief Complaint: Medication Refill   History of Present Illness:  [name] is a 48 y.o. very pleasant female patient who presents with the following:  She needs some refills of her medications.  She may use albuterol 2x a day; this is actually better than she has done in the past.  She has been on an inhaled steroid, but had difficulty affording them and noted that they seemed to cause thrush.    She also uses zoloft- she feels that she is doing well on this. She wants to continue it.   Her father died in 28-Nov-2022 of this year.  She had used more of her xanax in the period surrounding his death, but she has been able to scale back to about 1 a day since then. She will occasionally take 2 a day  She is still smoking about 1 PPD.  She knows that she does need to quit but is not yet motivated to do so.  Patient Active Problem List   Diagnosis Date Noted  . Smoking 01/22/2013  . Mood disorder 06/12/2012  . Bronchospasm 06/12/2012    Past Medical History  Diagnosis Date  . Allergy   . Depression   . Asthma   . Anxiety     Past Surgical History  Procedure Laterality Date  . Cholecystectomy    . Tubal ligation      History  Substance Use Topics  . Smoking status: Current Every Day Smoker -- 1.00 packs/day    Types: Cigarettes  . Smokeless tobacco: Not on file  . Alcohol Use: No    Family History  Problem Relation Age of Onset  . Stroke Mother   . Heart disease Father     Allergies  Allergen Reactions  . Codeine   . Guaifenesin Hives    Medication list has been reviewed and updated.  Current Outpatient Prescriptions on File Prior to Visit  Medication Sig Dispense Refill  . albuterol (VENTOLIN HFA) 108 (90 BASE) MCG/ACT inhaler INHALE TWO PUFFS BY MOUTH 4 TIMES  DAILY AS NEEDED.  Pt prefers ventolin brand  2 each  11  . ALPRAZolam (XANAX) 1 MG tablet TAKE 1 TO 2 TABLETS BY MOUTH AT BEDTIME AS NEEDED FPR SLEEP  60 tablet  1  . benzonatate (TESSALON) 100 MG capsule Take 1 capsule (100 mg total) by mouth 3 (three) times daily as needed for cough.  20 capsule  1  . ondansetron (ZOFRAN-ODT) 8 MG disintegrating tablet Take 1 tablet (8 mg total) by mouth every 8 (eight) hours as needed for nausea.  16 tablet  0  . predniSONE (DELTASONE) 20 MG tablet Take 2 pills a day for 3 days and then 1 pill a day for 3 days  9 tablet  0  . sertraline (ZOLOFT) 100 MG tablet Take 1 tablet (100 mg total) by mouth daily.  90 tablet  3  . sulfamethoxazole-trimethoprim (BACTRIM DS) 800-160 MG per tablet Take 1 tablet by mouth 2 (two) times daily.  14 tablet  0   No current facility-administered medications on file prior to visit.    Review of Systems:  As per HPI- otherwise negative.   Physical Examination: Filed Vitals:   05/16/14 1439  BP: 112/72  Pulse: 98  Temp: 98.4 F (36.9 C)  Resp: 16   Filed Vitals:   05/16/14 1439  Height: 5\' 5"  (1.651 m)  Weight: 294 lb 6.4 oz (133.539 kg)   Body mass index is 48.99 kg/(m^2). Ideal Body Weight: Weight in (lb) to have BMI = 25: 149.9  GEN: WDWN, NAD, Non-toxic, A & O x 3, obese, "smoker's cough"  HEENT: Atraumatic, Normocephalic. Neck supple. No masses, No LAD. Ears and Nose: No external deformity. CV: RRR, No M/G/R. No JVD. No thrill. No extra heart sounds. PULM: CTA B, no wheezes, crackles, rhonchi. No retractions. No resp. distress. No accessory muscle use. EXTR: No c/c/e NEURO Normal gait.  PSYCH: Normally interactive. Conversant. Not depressed or anxious appearing.  Calm demeanor.    Assessment and Plan: Cough - Plan: montelukast (SINGULAIR) 10 MG tablet  Chest congestion  Wheezing - Plan: albuterol (VENTOLIN HFA) 108 (90 BASE) MCG/ACT inhaler, montelukast (SINGULAIR) 10 MG tablet  Insomnia - Plan:  sertraline (ZOLOFT) 100 MG tablet  Mood disorder - Plan: sertraline (ZOLOFT) 100 MG tablet, ALPRAZolam (XANAX) 1 MG tablet  Refilled medications as above.  Her depression is under good control, refilled her xanax explained that we would like for her to only need her albuterol twice a week or so. She would like to add singulair which she has used in the past with success as long as it is not too expensive.    Encouraged her to quit smoking.  She will think about it.    Signed Abbe AmsterdamJessica Rithika Seel, MD

## 2014-12-31 ENCOUNTER — Ambulatory Visit (INDEPENDENT_AMBULATORY_CARE_PROVIDER_SITE_OTHER): Payer: BLUE CROSS/BLUE SHIELD | Admitting: Family Medicine

## 2014-12-31 VITALS — BP 132/80 | HR 91 | Temp 98.0°F | Resp 17 | Ht 66.5 in | Wt 291.0 lb

## 2014-12-31 DIAGNOSIS — J410 Simple chronic bronchitis: Secondary | ICD-10-CM

## 2014-12-31 DIAGNOSIS — R05 Cough: Secondary | ICD-10-CM

## 2014-12-31 DIAGNOSIS — J42 Unspecified chronic bronchitis: Secondary | ICD-10-CM | POA: Insufficient documentation

## 2014-12-31 DIAGNOSIS — R062 Wheezing: Secondary | ICD-10-CM | POA: Diagnosis not present

## 2014-12-31 DIAGNOSIS — G4700 Insomnia, unspecified: Secondary | ICD-10-CM

## 2014-12-31 DIAGNOSIS — J441 Chronic obstructive pulmonary disease with (acute) exacerbation: Secondary | ICD-10-CM | POA: Diagnosis not present

## 2014-12-31 DIAGNOSIS — R059 Cough, unspecified: Secondary | ICD-10-CM

## 2014-12-31 DIAGNOSIS — F39 Unspecified mood [affective] disorder: Secondary | ICD-10-CM

## 2014-12-31 MED ORDER — TIOTROPIUM BROMIDE MONOHYDRATE 18 MCG IN CAPS: 18.0000 ug | ORAL_CAPSULE | Freq: Every day | RESPIRATORY_TRACT | Status: DC

## 2014-12-31 MED ORDER — ALBUTEROL SULFATE HFA 108 (90 BASE) MCG/ACT IN AERS: INHALATION_SPRAY | RESPIRATORY_TRACT | Status: DC

## 2014-12-31 MED ORDER — ALPRAZOLAM 1 MG PO TABS: ORAL_TABLET | ORAL | Status: DC

## 2014-12-31 MED ORDER — SERTRALINE HCL 100 MG PO TABS: 100.0000 mg | ORAL_TABLET | Freq: Every day | ORAL | Status: DC

## 2014-12-31 MED ORDER — MONTELUKAST SODIUM 10 MG PO TABS: 10.0000 mg | ORAL_TABLET | Freq: Every day | ORAL | Status: DC

## 2014-12-31 NOTE — Progress Notes (Signed)
[facility] [address], [place] [id number] [phone]  Date:  12/31/2014   Name:  Stephanie PeakDenise M Holt   DOB:  [birth year]   MRN:  [id number]  PCP:  Tally DueGUEST, CHRIS WARREN, MD    Chief Complaint: Medication Refill   History of Present Illness:  Stephanie BushmanDenise M Dorsainvil is a 49 y.o. very pleasant female patient who presents with the following:  Here today seeking medication refill She still needs to use her albuterol daily, but does feel that the singulair is helping her with wheezing She feels that she is doing well on her zoloft- "it keeps my evil twin away."  Definitely wants to continue using this  She uses her xanax as needed for panic/ anxiety during the day. And at night she is taking 1 tablet for sleep.   She is currently smoking 1 ppd- she used to smoke much more, started in her teens.  She has no interest in quitting smoking at this time.  She does not feel that her breathing holds her back from what she wants to do.  She does have a cough much of the time however.  Never tested for COPD formally   Patient Active Problem List   Diagnosis Date Noted  . Smoking 01/22/2013  . Mood disorder 06/12/2012  . Bronchospasm 06/12/2012    Past Medical History  Diagnosis Date  . Allergy   . Depression   . Asthma   . Anxiety     Past Surgical History  Procedure Laterality Date  . Cholecystectomy    . Tubal ligation      History  Substance Use Topics  . Smoking status: Current Every Day Smoker -- 1.00 packs/day for 34 years    Types: Cigarettes  . Smokeless tobacco: Not on file  . Alcohol Use: No    Family History  Problem Relation Age of Onset  . Stroke Mother   . Heart disease Father     Allergies  Allergen Reactions  . Codeine   . Guaifenesin Hives    Medication list has been reviewed and updated.  Current Outpatient Prescriptions on File Prior to Visit  Medication Sig Dispense Refill  . albuterol (VENTOLIN HFA) 108 (90 BASE) MCG/ACT inhaler  INHALE TWO PUFFS BY MOUTH 4 TIMES DAILY AS NEEDED.  Pt prefers ventolin brand 2 each 11  . ALPRAZolam (XANAX) 1 MG tablet TAKE 1 TO 2 TABLETS BY MOUTH AT BEDTIME AS NEEDED FPR SLEEP 60 tablet 5  . benzonatate (TESSALON) 100 MG capsule Take 1 capsule (100 mg total) by mouth 3 (three) times daily as needed for cough. 20 capsule 1  . montelukast (SINGULAIR) 10 MG tablet Take 1 tablet (10 mg total) by mouth at bedtime. 30 tablet 3  . sertraline (ZOLOFT) 100 MG tablet Take 1 tablet (100 mg total) by mouth daily. 90 tablet 3  . ondansetron (ZOFRAN-ODT) 8 MG disintegrating tablet Take 1 tablet (8 mg total) by mouth every 8 (eight) hours as needed for nausea. (Patient not taking: Reported on 12/31/2014) 16 tablet 0   No current facility-administered medications on file prior to visit.    Review of Systems:  As per HPI- otherwise negative.   Physical Examination: Filed Vitals:   12/31/14 1543  BP: 132/80  Pulse: 91  Temp: 98 F (36.7 C)  Resp: 17   Filed Vitals:   12/31/14 1543  Height: 5' 6.5" (1.689 m)  Weight: 291 lb (131.997 kg)   Body mass index is  46.27 kg/(m^2). Ideal Body Weight: Weight in (lb) to have BMI = 25: 156.9  GEN: WDWN, NAD, Non-toxic, A & O x 3, obese HEENT: Atraumatic, Normocephalic. Neck supple. No masses, No LAD. Ears and Nose: No external deformity. CV: RRR, No M/G/R. No JVD. No thrill. No extra heart sounds. PULM: no crackles, rhonchi. No retractions. No resp. distress. No accessory muscle use. Expiratory wheezes.  "throaty" wheezing laugh typical of heavy smoker/ COPD ABD: S, NT, ND, +BS. No rebound. No HSM. EXTR: No c/c/e NEURO Normal gait.  PSYCH: Normally interactive. Conversant. Not depressed or anxious appearing.  Calm demeanor.   Spirometry: reduced FVC and FEV1- restriction probably per machine read  Assessment and Plan: Cough - Plan: montelukast (SINGULAIR) 10 MG tablet, Spirometry with Graph  Wheezing - Plan: montelukast (SINGULAIR) 10 MG  tablet, albuterol (VENTOLIN HFA) 108 (90 BASE) MCG/ACT inhaler  Insomnia - Plan: sertraline (ZOLOFT) 100 MG tablet  Mood disorder - Plan: sertraline (ZOLOFT) 100 MG tablet, ALPRAZolam (XANAX) 1 MG tablet  COPD exacerbation - Plan: tiotropium (SPIRIVA) 18 MCG inhalation capsule  Simple chronic bronchitis   Suspect that she does have COPD.  Encouraged her to think about quitting smoking but she is not interested at this time.  She declines chantix rx. She will try spiriva for a month or so as long as not too expensive.  OW I have refilled her medications, asked her to stick with one provider for her xanax (I can do this for her)   Signed Abbe AmsterdamJessica Marcellous Snarski, MD

## 2014-12-31 NOTE — Patient Instructions (Addendum)
Please think about quitting smoking- I really think that this will improve your long term quality of life and help to keep you from needing oxygen one day For now try the spiriva inhaler once a day (if you have symptoms or not) and let me know if it helps with your breathing  [facility] Policy for Prescribing Controlled Substances (Revised 06/2012) 1. Prescriptions for controlled substances will be filled by ONE provider at [facility] with whom you have established and developed a plan for your care, including follow-up. 2. You are encouraged to schedule an appointment with your prescriber at our appointment center for follow-up visits whenever possible. 3. If you request a prescription for the controlled substance while at [facility] for an acute problem (with someone other than your regular prescriber), you MAY be given a ONE-TIME prescription for a 30-day supply of the controlled substance, to allow time for you to return to see your regular prescriber for additional prescriptions.

## 2015-04-02 ENCOUNTER — Ambulatory Visit (INDEPENDENT_AMBULATORY_CARE_PROVIDER_SITE_OTHER): Payer: BLUE CROSS/BLUE SHIELD | Admitting: Physician Assistant

## 2015-04-02 VITALS — BP 122/82 | HR 85 | Temp 98.7°F | Resp 18 | Ht 66.0 in | Wt 276.0 lb

## 2015-04-02 DIAGNOSIS — R5383 Other fatigue: Secondary | ICD-10-CM

## 2015-04-02 DIAGNOSIS — E119 Type 2 diabetes mellitus without complications: Secondary | ICD-10-CM

## 2015-04-02 DIAGNOSIS — R1084 Generalized abdominal pain: Secondary | ICD-10-CM | POA: Diagnosis not present

## 2015-04-02 DIAGNOSIS — R197 Diarrhea, unspecified: Secondary | ICD-10-CM

## 2015-04-02 DIAGNOSIS — E782 Mixed hyperlipidemia: Secondary | ICD-10-CM | POA: Diagnosis not present

## 2015-04-02 LAB — COMPREHENSIVE METABOLIC PANEL
ALT: 31 U/L — AB (ref 6–29)
AST: 21 U/L (ref 10–35)
Albumin: 4 g/dL (ref 3.6–5.1)
Alkaline Phosphatase: 106 U/L (ref 33–130)
BUN: 13 mg/dL (ref 7–25)
CHLORIDE: 97 mmol/L — AB (ref 98–110)
CO2: 31 mmol/L (ref 20–31)
Calcium: 9.2 mg/dL (ref 8.6–10.4)
Creat: 0.81 mg/dL (ref 0.50–1.05)
GLUCOSE: 402 mg/dL — AB (ref 65–99)
POTASSIUM: 4.2 mmol/L (ref 3.5–5.3)
Sodium: 136 mmol/L (ref 135–146)
Total Bilirubin: 0.6 mg/dL (ref 0.2–1.2)
Total Protein: 7 g/dL (ref 6.1–8.1)

## 2015-04-02 LAB — POCT GLYCOSYLATED HEMOGLOBIN (HGB A1C): Hemoglobin A1C: 10.4

## 2015-04-02 LAB — POCT URINALYSIS DIPSTICK
BILIRUBIN UA: NEGATIVE
Blood, UA: NEGATIVE
Glucose, UA: 500
KETONES UA: NEGATIVE
LEUKOCYTES UA: NEGATIVE
Nitrite, UA: NEGATIVE
PH UA: 6
Protein, UA: NEGATIVE
Spec Grav, UA: 1.01
Urobilinogen, UA: 0.2

## 2015-04-02 LAB — LIPID PANEL
CHOL/HDL RATIO: 6.1 ratio — AB (ref <=5.0)
Cholesterol: 208 mg/dL — ABNORMAL HIGH (ref 125–200)
HDL: 34 mg/dL — ABNORMAL LOW (ref >=46)
LDL Cholesterol: 96 mg/dL (ref <130)
Triglycerides: 389 mg/dL — ABNORMAL HIGH (ref <150)
VLDL: 78 mg/dL — AB (ref <30)

## 2015-04-02 LAB — POCT CBC
Granulocyte percent: 72.8 %G (ref 37–80)
HCT, POC: 49.1 % — AB (ref 37.7–47.9)
Hemoglobin: 16.1 g/dL (ref 12.2–16.2)
LYMPH, POC: 2.1 (ref 0.6–3.4)
MCH: 29.1 pg (ref 27–31.2)
MCHC: 32.8 g/dL (ref 31.8–35.4)
MCV: 89 fL (ref 80–97)
MID (cbc): 0.5 (ref 0–0.9)
MPV: 9.4 fL (ref 0–99.8)
POC Granulocyte: 6.9 (ref 2–6.9)
POC LYMPH PERCENT: 22.2 %L (ref 10–50)
POC MID %: 5 % (ref 0–12)
Platelet Count, POC: 150 10*3/uL (ref 142–424)
RBC: 5.52 M/uL — AB (ref 4.04–5.48)
RDW, POC: 12.9 %
WBC: 9.5 10*3/uL (ref 4.6–10.2)

## 2015-04-02 LAB — POCT UA - MICROSCOPIC ONLY
Bacteria, U Microscopic: NEGATIVE
Crystals, Ur, HPF, POC: NEGATIVE
MUCUS UA: NEGATIVE
RBC, urine, microscopic: NEGATIVE
YEAST UA: NEGATIVE

## 2015-04-02 LAB — GLUCOSE, POCT (MANUAL RESULT ENTRY): POC Glucose: 415 mg/dl — AB (ref 70–99)

## 2015-04-02 MED ORDER — LISINOPRIL 2.5 MG PO TABS: 2.5000 mg | ORAL_TABLET | Freq: Every day | ORAL | Status: DC

## 2015-04-02 MED ORDER — METFORMIN HCL 500 MG PO TABS: 1000.0000 mg | ORAL_TABLET | Freq: Two times a day (BID) | ORAL | Status: DC

## 2015-04-02 MED ORDER — BLOOD GLUCOSE MONITOR KIT: PACK | Status: AC

## 2015-04-02 NOTE — Patient Instructions (Addendum)
Your symptoms are due to type 2 diabetes.  Please start taking the metformin 1/2 tab twice daily for 2 weeks. You may get an upset stomach with this.  Then increase the dose to 1000 mg twice daily which will likely be your long term dose.  I've referred you for diabetes education and they'll be in contact with you. Please start taking the lisinopril once daily for kidney protection. This can lower your bp a bit so please let us know if you start feeling dizzy with this.  Getting daily exercise and limiting your sweets intake will help to keep the sugar under control.  Please check your sugar at home 3-4 times per week. Record these numbers. Please bring them with you to your next appointment.  Please plan to see [place] back in 3 months. We will recheck your labs and see how you're doing at that time.  If you notice your sugar running below 80 are above 200 please let us know prior to your return appointment.   Type 2 Diabetes Mellitus Type 2 diabetes mellitus, often simply referred to as type 2 diabetes, is a long-lasting (chronic) disease. In type 2 diabetes, the pancreas does not make enough insulin (a hormone), the cells are less responsive to the insulin that is made (insulin resistance), or both. Normally, insulin moves sugars from food into the tissue cells. The tissue cells use the sugars for energy. The lack of insulin or the lack of normal response to insulin causes excess sugars to build up in the blood instead of going into the tissue cells. As a result, high blood sugar (hyperglycemia) develops. The effect of high sugar (glucose) levels can cause many complications. Type 2 diabetes was also previously called adult-onset diabetes, but it can occur at any age.  RISK FACTORS  A person is predisposed to developing type 2 diabetes if someone in the family has the disease and also has one or more of the following primary risk factors:  Overweight.  An inactive lifestyle.  A history of  consistently eating high-calorie foods. Maintaining a normal weight and regular physical activity can reduce the chance of developing type 2 diabetes. SYMPTOMS  A person with type 2 diabetes may not show symptoms initially. The symptoms of type 2 diabetes appear slowly. The symptoms include:  Increased thirst (polydipsia).  Increased urination (polyuria).  Increased urination during the night (nocturia).  Weight loss. This weight loss may be rapid.  Frequent, recurring infections.  Tiredness (fatigue).  Weakness.  Vision changes, such as blurred vision.  Fruity smell to your breath.  Abdominal pain.  Nausea or vomiting.  Cuts or bruises which are slow to heal.  Tingling or numbness in the hands or feet. DIAGNOSIS Type 2 diabetes is frequently not diagnosed until complications of diabetes are present. Type 2 diabetes is diagnosed when symptoms or complications are present and when blood glucose levels are increased. Your blood glucose level may be checked by one or more of the following blood tests:  A fasting blood glucose test. You will not be allowed to eat for at least 8 hours before a blood sample is taken.  A random blood glucose test. Your blood glucose is checked at any time of the day regardless of when you ate.  A hemoglobin A1c blood glucose test. A hemoglobin A1c test provides information about blood glucose control over the previous 3 months.  An oral glucose tolerance test (OGTT). Your blood glucose is measured after you have not eaten (fasted)  for 2 hours and then after you drink a glucose-containing beverage. TREATMENT   You may need to take insulin or diabetes medicine daily to keep blood glucose levels in the desired range.  If you use insulin, you may need to adjust the dosage depending on the carbohydrates that you eat with each meal or snack. The treatment goal is to maintain the before meal blood sugar (preprandial glucose) level at 70-130  mg/dL. HOME CARE INSTRUCTIONS   Have your hemoglobin A1c level checked twice a year.  Perform daily blood glucose monitoring as directed by your health care provider.  Monitor urine ketones when you are ill and as directed by your health care provider.  Take your diabetes medicine or insulin as directed by your health care provider to maintain your blood glucose levels in the desired range.  Never run out of diabetes medicine or insulin. It is needed every day.  If you are using insulin, you may need to adjust the amount of insulin given based on your intake of carbohydrates. Carbohydrates can raise blood glucose levels but need to be included in your diet. Carbohydrates provide vitamins, minerals, and fiber which are an essential part of a healthy diet. Carbohydrates are found in fruits, vegetables, whole grains, dairy products, legumes, and foods containing added sugars.  Eat healthy foods. You should make an appointment to see a registered dietitian to help you create an eating plan that is right for you.  Lose weight if you are overweight.  Carry a medical alert card or wear your medical alert jewelry.  Carry a 15-gram carbohydrate snack with you at all times to treat low blood glucose (hypoglycemia). Some examples of 15-gram carbohydrate snacks include:  Glucose tablets, 3 or 4.  Glucose gel, 15-gram tube.  Raisins, 2 tablespoons (24 grams).  Jelly beans, 6.  Animal crackers, 8.  Regular pop, 4 ounces (120 mL).  Gummy treats, 9.  Recognize hypoglycemia. Hypoglycemia occurs with blood glucose levels of 70 mg/dL and below. The risk for hypoglycemia increases when fasting or skipping meals, during or after intense exercise, and during sleep. Hypoglycemia symptoms can include:  Tremors or shakes.  Decreased ability to concentrate.  Sweating.  Increased heart rate.  Headache.  Dry mouth.  Hunger.  Irritability.  Anxiety.  Restless sleep.  Altered speech or  coordination.  Confusion.  Treat hypoglycemia promptly. If you are alert and able to safely swallow, follow the 15:15 rule:  Take 15-20 grams of rapid-acting glucose or carbohydrate. Rapid-acting options include glucose gel, glucose tablets, or 4 ounces (120 mL) of fruit juice, regular soda, or low-fat milk.  Check your blood glucose level 15 minutes after taking the glucose.  Take 15-20 grams more of glucose if the repeat blood glucose level is still 70 mg/dL or below.  Eat a meal or snack within 1 hour once blood glucose levels return to normal.  Be alert to feeling very thirsty and urinating more frequently than usual, which are early signs of hyperglycemia. An early awareness of hyperglycemia allows for prompt treatment. Treat hyperglycemia as directed by your health care provider.  Engage in at least 150 minutes of moderate-intensity physical activity a week, spread over at least 3 days of the week or as directed by your health care provider. In addition, you should engage in resistance exercise at least 2 times a week or as directed by your health care provider. Try to spend no more than 90 minutes at one time inactive.  Adjust your medicine and  food intake as needed if you start a new exercise or sport.  Follow your sick-day plan anytime you are unable to eat or drink as usual.  Do not use any tobacco products including cigarettes, chewing tobacco, or electronic cigarettes. If you need help quitting, ask your health care provider.  Limit alcohol intake to no more than 1 drink per day for nonpregnant women and 2 drinks per day for men. You should drink alcohol only when you are also eating food. Talk with your health care provider whether alcohol is safe for you. Tell your health care provider if you drink alcohol several times a week.  Keep all follow-up visits as directed by your health care provider. This is important.  Schedule an eye exam soon after the diagnosis of type 2  diabetes and then annually.  Perform daily skin and foot care. Examine your skin and feet daily for cuts, bruises, redness, nail problems, bleeding, blisters, or sores. A foot exam by a health care provider should be done annually.  Brush your teeth and gums at least twice a day and floss at least once a day. Follow up with your dentist regularly.  Share your diabetes management plan with your workplace or school.  Stay up-to-date with immunizations. It is recommended that people with diabetes who are over 82 years old get the pneumonia vaccine. In some cases, two separate shots may be given. Ask your health care provider if your pneumonia vaccination is up-to-date.  Learn to manage stress.  Obtain ongoing diabetes education and support as needed.  Participate in or seek rehabilitation as needed to maintain or improve independence and quality of life. Request a physical or occupational therapy referral if you are having foot or hand numbness, or difficulties with grooming, dressing, eating, or physical activity. SEEK MEDICAL CARE IF:   You are unable to eat food or drink fluids for more than 6 hours.  You have nausea and vomiting for more than 6 hours.  Your blood glucose level is over 240 mg/dL.  There is a change in mental status.  You develop an additional serious illness.  You have diarrhea for more than 6 hours.  You have been sick or have had a fever for a couple of days and are not getting better.  You have pain during any physical activity.  SEEK IMMEDIATE MEDICAL CARE IF:  You have difficulty breathing.  You have moderate to large ketone levels. MAKE SURE YOU:  Understand these instructions.  Will watch your condition.  Will get help right away if you are not doing well or get worse. Document Released: 08/03/2005 Document Revised: 12/18/2013 Document Reviewed: 03/01/2012 [facility] Patient Information 2015 [place], [place]. This information is not intended to  replace advice given to you by your health care provider. Make sure you discuss any questions you have with your health care provider.

## 2015-04-02 NOTE — Progress Notes (Signed)
Subjective:    Patient ID: [name], female    DOB: 02-02-1966, 49 y.o.   MRN: [id number]  Chief Complaint  Patient presents with  . Emesis    x6 days  . Diarrhea  . Fatigue  . feeling hot    states she feels like she is getting ready to burst into flames   Medications, allergies, past medical history, surgical history, family history, social history and problem list reviewed and updated.  HPI  49 yof presents with above sx.   Started 6 days ago with not feeling well, feeling like couldn't drink enough. Drinking and peeing a lot. Couldn't eat for 3 days due to nausea and vomiting. Emesis has resolved past few days. Keeping fluids and soups down past couple days. Several episodes of non bloody diarrhea past few days. No abd pain. Has felt warm, fatigued past few days.   Has had gb removed.   Denies sick contacts, cp, sob.   Review of Systems See HPI     Objective:   Physical Exam  Constitutional: She appears well-developed and well-nourished.  Non-toxic appearance. She does not have a sickly appearance. She does not appear ill. No distress.  BP 122/82 mmHg  Pulse 85  Temp(Src) 98.7 F (37.1 C) (Oral)  Resp 18  Ht 5' 6" (1.676 m)  Wt 276 lb (125.193 kg)  BMI 44.57 kg/m2  SpO2 97%   Cardiovascular: Normal rate, regular rhythm and normal heart sounds.   Pulmonary/Chest: Effort normal and breath sounds normal.  Abdominal: Soft. Normal appearance and bowel sounds are normal. There is generalized tenderness. There is no rigidity, no rebound, no guarding, no CVA tenderness, no tenderness at McBurney's point and negative Murphy's sign.  Moderate generalized abd pain.    Results for orders placed or performed in visit on 04/02/15  POCT glycosylated hemoglobin (Hb A1C)  Result Value Ref Range   Hemoglobin A1C 10.4   POCT glucose (manual entry)  Result Value Ref Range   POC Glucose 415 (A) 70 - 99 mg/dl  POCT CBC  Result Value Ref Range   WBC 9.5 4.6 - 10.2 K/uL   Lymph, poc 2.1 0.6 - 3.4   POC LYMPH PERCENT 22.2 10 - 50 %L   MID (cbc) 0.5 0 - 0.9   POC MID % 5.0 0 - 12 %M   POC Granulocyte 6.9 2 - 6.9   Granulocyte percent 72.8 37 - 80 %G   RBC 5.52 (A) 4.04 - 5.48 M/uL   Hemoglobin 16.1 12.2 - 16.2 g/dL   HCT, POC 49.1 (A) 37.7 - 47.9 %   MCV 89.0 80 - 97 fL   MCH, POC 29.1 27 - 31.2 pg   MCHC 32.8 31.8 - 35.4 g/dL   RDW, POC 12.9 %   Platelet Count, POC 150.0 142 - 424 K/uL   MPV 9.4 0 - 99.8 fL  POCT urinalysis dipstick  Result Value Ref Range   Color, UA Light Yellow    Clarity, UA Slightly Cloudy    Glucose, UA 500    Bilirubin, UA Negative    Ketones, UA Negative    Spec Grav, UA 1.010    Blood, UA Negative    pH, UA 6.0    Protein, UA negative    Urobilinogen, UA 0.2    Nitrite, UA Negative    Leukocytes, UA Negative Negative  POCT UA - Microscopic Only  Result Value Ref Range   WBC, Ur, HPF, POC 0-1  RBC, urine, microscopic Negative    Bacteria, U Microscopic Negative    Mucus, UA Negative    Epithelial cells, urine per micros 0-2    Crystals, Ur, HPF, POC Negative    Casts, Ur, LPF, POC Hyphae    Yeast, UA Negative       Assessment & Plan:   Diarrhea - Plan: POCT CBC  Generalized abdominal pain - Plan: POCT glycosylated hemoglobin (Hb A1C), POCT glucose (manual entry), POCT urinalysis dipstick, POCT UA - Microscopic Only, Comprehensive metabolic panel  Other fatigue - Plan: POCT glycosylated hemoglobin (Hb A1C), POCT glucose (manual entry)  Type 2 diabetes mellitus without complication - Plan: Lipid panel, Microalbumin, urine, lisinopril (PRINIVIL,ZESTRIL) 2.5 MG tablet, metFORMIN (GLUCOPHAGE) 500 MG tablet, Amb ref to Medical Nutrition Therapy-MNT, blood glucose meter kit and supplies KIT --new onset diabetes likely cause of sx with no leukocytosis, no uti, not dehydrated on ua --start metformin 500 bid --> 1000 mg bid after 2 wks, reviewed GI se --start low dose lisinopril for renal protection, pt to contact  office with dizziness --checking lipids, urine ma, cmp today --referral for diabetes education, her husband is with her today who is also diabetic and willing to help her  --glucometer kit sent in, record bg and bring to next appt 3 months -- pt to inform us of persistent low or high numbers  --encouraged wt loss, exercise, limiting sugar intake  Julieta Gutting, PA-C Physician Assistant-Certified [facility]  04/02/2015 4:53 PM

## 2015-04-03 DIAGNOSIS — E782 Mixed hyperlipidemia: Secondary | ICD-10-CM | POA: Insufficient documentation

## 2015-04-03 DIAGNOSIS — E119 Type 2 diabetes mellitus without complications: Secondary | ICD-10-CM | POA: Insufficient documentation

## 2015-04-03 LAB — MICROALBUMIN, URINE: MICROALB UR: 0.5 mg/dL (ref <2.0)

## 2015-04-03 MED ORDER — ATORVASTATIN CALCIUM 10 MG PO TABS: 10.0000 mg | ORAL_TABLET | Freq: Every day | ORAL | Status: DC

## 2015-04-03 NOTE — Addendum Note (Signed)
Addended byDonnajean Lopes on: 04/03/2015 09:22 PM   Modules accepted: Orders

## 2015-04-19 ENCOUNTER — Other Ambulatory Visit: Payer: Self-pay | Admitting: Family Medicine

## 2015-05-10 ENCOUNTER — Encounter: Payer: Self-pay | Admitting: *Deleted

## 2015-07-16 ENCOUNTER — Other Ambulatory Visit: Payer: Self-pay | Admitting: Family Medicine

## 2015-10-16 ENCOUNTER — Other Ambulatory Visit: Payer: Self-pay | Admitting: Family Medicine

## 2015-10-22 NOTE — Telephone Encounter (Signed)
Will need an OV before more refills are given.

## 2015-10-22 NOTE — Telephone Encounter (Signed)
Faxed and notified pt on VM she needs OV for more.

## 2015-11-23 ENCOUNTER — Ambulatory Visit (INDEPENDENT_AMBULATORY_CARE_PROVIDER_SITE_OTHER): Payer: Self-pay | Admitting: Internal Medicine

## 2015-11-23 VITALS — BP 132/74 | HR 78 | Temp 98.0°F | Resp 18 | Ht 65.5 in | Wt 284.0 lb

## 2015-11-23 DIAGNOSIS — F17200 Nicotine dependence, unspecified, uncomplicated: Secondary | ICD-10-CM

## 2015-11-23 DIAGNOSIS — R062 Wheezing: Secondary | ICD-10-CM

## 2015-11-23 DIAGNOSIS — E119 Type 2 diabetes mellitus without complications: Secondary | ICD-10-CM

## 2015-11-23 DIAGNOSIS — Z6841 Body Mass Index (BMI) 40.0 and over, adult: Secondary | ICD-10-CM

## 2015-11-23 DIAGNOSIS — IMO0001 Reserved for inherently not codable concepts without codable children: Secondary | ICD-10-CM

## 2015-11-23 DIAGNOSIS — Z720 Tobacco use: Secondary | ICD-10-CM

## 2015-11-23 DIAGNOSIS — E782 Mixed hyperlipidemia: Secondary | ICD-10-CM

## 2015-11-23 DIAGNOSIS — F39 Unspecified mood [affective] disorder: Secondary | ICD-10-CM

## 2015-11-23 DIAGNOSIS — J410 Simple chronic bronchitis: Secondary | ICD-10-CM

## 2015-11-23 DIAGNOSIS — G4700 Insomnia, unspecified: Secondary | ICD-10-CM

## 2015-11-23 MED ORDER — ATORVASTATIN CALCIUM 10 MG PO TABS: 10.0000 mg | ORAL_TABLET | Freq: Every day | ORAL | Status: AC

## 2015-11-23 MED ORDER — LISINOPRIL 2.5 MG PO TABS: 2.5000 mg | ORAL_TABLET | Freq: Every day | ORAL | Status: DC

## 2015-11-23 MED ORDER — ALBUTEROL SULFATE HFA 108 (90 BASE) MCG/ACT IN AERS
INHALATION_SPRAY | RESPIRATORY_TRACT | Status: DC
Start: 2015-11-23 — End: 2016-09-26

## 2015-11-23 MED ORDER — SERTRALINE HCL 100 MG PO TABS: 100.0000 mg | ORAL_TABLET | Freq: Every day | ORAL | Status: DC

## 2015-11-23 MED ORDER — ALPRAZOLAM 1 MG PO TABS: ORAL_TABLET | ORAL | Status: DC

## 2015-11-23 MED ORDER — METFORMIN HCL 500 MG PO TABS: 1000.0000 mg | ORAL_TABLET | Freq: Two times a day (BID) | ORAL | Status: DC

## 2015-11-23 NOTE — Progress Notes (Signed)
Subjective:  By signing my name below, I, Moises Blood, attest that this documentation has been prepared under the direction and in the presence of Tami Lin, MD. Electronically Signed: Moises Blood, [place]. 11/23/2015 , 9:35 AM .  Patient was seen in Room 14 . PCP DR Elder Cyphers   Patient ID: [name], female    DOB: [birth year], 50 y.o.   MRN: [id number] Chief Complaint  Patient presents with  . Medication Refill    xanax  zoloft  albuterol  . Depression    scored 20 on scale   HPI [name] is a 50 y.o. female who presents to [facility] for medication refill on xanax, zoloft and albuterol.  Last labwork was done in August 2016. Has lost job, And insurance, and has a new job but is in the waiting period before being insured again.   She is still smoking, and down to 10 cigarettes a day. Frequent cough. No shortness of breath.    Medications She denies being on singulair anymore.    Patient Active Problem List   Diagnosis Date Noted  . Type 2 diabetes mellitus without complication ([place])--- see past office notes. On metformin. Home blood sugars look good. She denies numbness in her feet. 04/03/2015  . Elevated cholesterol with elevated triglycerides 04/03/2015  . Chronic bronchitis ([place]) 12/31/2014  . Smoking--improving  01/22/2013  . Mood disorder ([place])--- she is doing well with Xanax and Zoloft /she describes herself as hateful to those around her when she's not on medication and is happy to feel so well on medication  06/12/2012    Current outpatient prescriptions:  .  albuterol (VENTOLIN HFA) 108 (90 BASE) MCG/ACT inhaler, INHALE TWO PUFFS BY MOUTH 4 TIMES DAILY AS NEEDED.  Pt prefers ventolin brand, Disp: 2 each, Rfl: 11 .  ALPRAZolam (XANAX) 1 MG tablet, TAKE 1 TABLET BY MOUTH ONCE A DAY AND AT BEDTIME AS NEEDED FOR SLEEP, Disp: 60 tablet, Rfl: 0 .  atorvastatin (LIPITOR) 10 MG tablet, Take 1 tablet (10 mg total) by mouth daily., Disp: 90 tablet, Rfl: 3 .  blood  glucose meter kit and supplies KIT, Dispense based on patient and insurance preference. Use up to four times daily as directed. (FOR ICD-9 250.00, 250.01)., Disp: 1 each, Rfl: 0 .  lisinopril (PRINIVIL,ZESTRIL) 2.5 MG tablet, Take 1 tablet (2.5 mg total) by mouth daily., Disp: 30 tablet, Rfl: 5 .  loratadine (CLARITIN) 10 MG tablet, Take 10 mg by mouth daily., Disp: , Rfl:  .  metFORMIN (GLUCOPHAGE) 500 MG tablet, Take 2 tablets (1,000 mg total) by mouth 2 (two) times daily with a meal. Start with 500 mg in am and 500 mg in pm for 2 weeks, Disp: 180 tablet, Rfl: 3 .  sertraline (ZOLOFT) 100 MG tablet, Take 1 tablet (100 mg total) by mouth daily., Disp: 90 tablet, Rfl: 3  Review of Systems  Constitutional: Negative for fever, chills, fatigue and unexpected weight change.  Respiratory: Negative for cough.   Gastrointestinal: Negative for nausea, vomiting, diarrhea and constipation.  Skin: Negative for rash and wound.  Neurological: Negative for dizziness, weakness, numbness and headaches.  Remainder noncontributory     Objective:   Physical Exam  Constitutional: She is oriented to person, place, and time. She appears well-developed and well-nourished. No distress.  HENT:  Head: Normocephalic and atraumatic.  Eyes: EOM are normal. Pupils are equal, round, and reactive to light.  Neck: Neck supple.  Cardiovascular: Normal rate.   Pulmonary/Chest: Effort normal.  No respiratory distress. She has wheezes (on forced expiration).  Musculoskeletal: Normal range of motion.  Neurological: She is alert and oriented to person, place, and time.  Skin: Skin is warm and dry.  Psychiatric: She has a normal mood and affect. Her behavior is normal.  Nursing note and vitals reviewed.   BP 132/74 mmHg  Pulse 78  Temp(Src) 98 F (36.7 C) (Oral)  Resp 18  Ht 5' 5.5" (1.664 m)  Wt 284 lb (128.822 kg)  BMI 46.52 kg/m2  SpO2 97%    Assessment & Plan:  I have completed the patient encounter in its  entirety as documented by the scribe, with editing by me where necessary. Trevor Duty P. Laney Pastor, M.D.   Type 2 diabetes mellitus without complication, without long-term current use of insulin (HCC) - Plan: atorvastatin (LIPITOR) 10 MG tablet, lisinopril (PRINIVIL,ZESTRIL) 2.5 MG tablet, metFORMIN (GLUCOPHAGE) 500 MG tablet  Elevated cholesterol with elevated triglycerides - Plan: atorvastatin (LIPITOR) 10 MG tablet  Mood disorder (HCC)/ Insomnia - Plan: sertraline (ZOLOFT) 100 MG tablet/xanax  Smoking//Wheezing/Simple chronic bronchitis (HCC)  BMI 45.0-49.9, adult ([place])  Meds ordered this encounter  Medications  . albuterol (VENTOLIN HFA) 108 (90 Base) MCG/ACT inhaler    Sig: INHALE TWO PUFFS BY MOUTH 4 TIMES DAILY AS NEEDED.  Pt prefers ventolin brand    Dispense:  2 each    Refill:  11  . ALPRAZolam (XANAX) 1 MG tablet    Sig: TAKE 1 TABLET BY MOUTH ONCE A DAY AND AT BEDTIME AS NEEDED FOR SLEEP    Dispense:  60 tablet    Refill:  5    Not to exceed 3 additional fills before 01/13/2016  . atorvastatin (LIPITOR) 10 MG tablet    Sig: Take 1 tablet (10 mg total) by mouth daily.    Dispense:  90 tablet    Refill:  3  . lisinopril (PRINIVIL,ZESTRIL) 2.5 MG tablet    Sig: Take 1 tablet (2.5 mg total) by mouth daily.    Dispense:  90 tablet    Refill:  3  . metFORMIN (GLUCOPHAGE) 500 MG tablet    Sig: Take 2 tablets (1,000 mg total) by mouth 2 (two) times daily with a meal.    Dispense:  180 tablet    Refill:  3  . sertraline (ZOLOFT) 100 MG tablet    Sig: Take 1 tablet (100 mg total) by mouth daily.    Dispense:  90 tablet    Refill:  3   F/u 6 mo for cpe/labs She has many unaddressed health issues including vaccines, ophthalmology, immunizations, colonoscopy, but she hopes to defer these until she is insured.

## 2015-12-08 ENCOUNTER — Ambulatory Visit (INDEPENDENT_AMBULATORY_CARE_PROVIDER_SITE_OTHER): Payer: Self-pay | Admitting: Physician Assistant

## 2015-12-08 VITALS — BP 138/87 | HR 84 | Temp 98.1°F | Resp 16 | Ht 65.5 in | Wt 278.0 lb

## 2015-12-08 DIAGNOSIS — J4520 Mild intermittent asthma, uncomplicated: Secondary | ICD-10-CM

## 2015-12-08 MED ORDER — BENZONATATE 100 MG PO CAPS: ORAL_CAPSULE | ORAL | Status: AC

## 2015-12-08 MED ORDER — AZITHROMYCIN 250 MG PO TABS: ORAL_TABLET | ORAL | Status: AC

## 2015-12-08 MED ORDER — HYDROCODONE-HOMATROPINE 5-1.5 MG/5ML PO SYRP: 5.0000 mL | ORAL_SOLUTION | Freq: Three times a day (TID) | ORAL | Status: DC | PRN

## 2015-12-08 NOTE — Patient Instructions (Signed)
     IF you received an x-ray today, you will receive an invoice from [facility]. Please contact [facility] at [phone] with questions or concerns regarding your invoice.   IF you received labwork today, you will receive an invoice from [facility]. Please contact [facility] at [phone] with questions or concerns regarding your invoice.   Our billing staff will not be able to assist you with questions regarding bills from these companies.  You will be contacted with the lab results as soon as they are available. The fastest way to get your results is to activate your My Chart account. Instructions are located on the last page of this paperwork. If you have not heard from us regarding the results in 2 weeks, please contact this office.      

## 2015-12-08 NOTE — Progress Notes (Signed)
[name]  MRN: [id number] DOB: 03-17-1966  Subjective:  Pt presents to clinic with 2 day h/o cold symptoms.  Started with a sore throat and then nasal congestion started.  Yesterday the cough got worse with SOB and wheezing.  She has white yellow rhinorrhea with PND and she is coughing up the same thing.  She has h/o chronic bronchitis and she has been using her albuterol and it is helping.  She is unable to sleep at night due to the cough.  Patient Active Problem List   Diagnosis Date Noted  . Type 2 diabetes mellitus without complication ([place]) 76/19/5093  . Elevated cholesterol with elevated triglycerides 04/03/2015  . Chronic bronchitis ([place]) 12/31/2014  . Smoking 01/22/2013  . Mood disorder ([employer]) 06/12/2012  . Bronchospasm 06/12/2012    Current Outpatient Prescriptions on File Prior to Visit  Medication Sig Dispense Refill  . albuterol (VENTOLIN HFA) 108 (90 Base) MCG/ACT inhaler INHALE TWO PUFFS BY MOUTH 4 TIMES DAILY AS NEEDED.  Pt prefers ventolin brand 2 each 11  . ALPRAZolam (XANAX) 1 MG tablet TAKE 1 TABLET BY MOUTH ONCE A DAY AND AT BEDTIME AS NEEDED FOR SLEEP 60 tablet 5  . atorvastatin (LIPITOR) 10 MG tablet Take 1 tablet (10 mg total) by mouth daily. 90 tablet 3  . blood glucose meter kit and supplies KIT Dispense based on patient and insurance preference. Use up to four times daily as directed. (FOR ICD-9 250.00, 250.01). 1 each 0  . lisinopril (PRINIVIL,ZESTRIL) 2.5 MG tablet Take 1 tablet (2.5 mg total) by mouth daily. 90 tablet 3  . loratadine (CLARITIN) 10 MG tablet Take 10 mg by mouth daily.    . metFORMIN (GLUCOPHAGE) 500 MG tablet Take 2 tablets (1,000 mg total) by mouth 2 (two) times daily with a meal. 180 tablet 3  . montelukast (SINGULAIR) 10 MG tablet Take 1 tablet (10 mg total) by mouth at bedtime. 90 tablet 3  . sertraline (ZOLOFT) 100 MG tablet Take 1 tablet (100 mg total) by mouth daily. 90 tablet 3   No current facility-administered medications on  file prior to visit.    Allergies  Allergen Reactions  . Codeine   . Guaifenesin Hives    Review of Systems  Constitutional: Negative for fever and chills.  HENT: Positive for congestion, postnasal drip and rhinorrhea (white/yellow).   Respiratory: Positive for cough (white/yellow), shortness of breath and wheezing.        H/o asthma, smoker 1/2 ppd  Gastrointestinal: Negative.   Musculoskeletal: Negative for myalgias.  Allergic/Immunologic: Positive for environmental allergies.  Neurological: Negative for headaches.  Psychiatric/Behavioral: Positive for sleep disturbance (2nd to cough).   Objective:  BP 138/87 mmHg  Pulse 84  Temp(Src) 98.1 F (36.7 C) (Oral)  Resp 16  Ht 5' 5.5" (1.664 m)  Wt 278 lb (126.1 kg)  BMI 45.54 kg/m2  SpO2 97%  Physical Exam  Constitutional: She is oriented to person, place, and time and well-developed, well-nourished, and in no distress.  HENT:  Head: Normocephalic and atraumatic.  Right Ear: Hearing, tympanic membrane, external ear and ear canal normal.  Left Ear: Hearing, tympanic membrane, external ear and ear canal normal.  Nose: Nose normal.  Mouth/Throat: Uvula is midline, oropharynx is clear and moist and mucous membranes are normal.  Eyes: Conjunctivae are normal.  Neck: Normal range of motion.  Cardiovascular: Normal rate, regular rhythm and normal heart sounds.   No murmur heard. Pulmonary/Chest: Effort normal. She has wheezes (end expiratory wheezing).  Neurological: She is alert and oriented to person, place, and time. Gait normal.  Skin: Skin is warm and dry.  Psychiatric: Mood, memory, affect and judgment normal.  Vitals reviewed.   Assessment and Plan :  Asthmatic bronchitis, mild intermittent, uncomplicated - Plan: azithromycin (ZITHROMAX Z-PAK) 250 MG tablet, HYDROcodone-homatropine (HYCODAN) 5-1.5 MG/5ML syrup, benzonatate (TESSALON) 100 MG capsule   Pt to continue to decrease smoking - cover in 2-3 days if her cough  is not improving - at this time this is viral but due to her smoking we will be careful.  She wants prednisone but we will try symptomatic care for the next 2-3 days.  Windell Hummingbird PA-C  [facility] 12/08/2015 8:45 AM

## 2015-12-10 ENCOUNTER — Telehealth: Payer: Self-pay

## 2015-12-10 MED ORDER — PREDNISONE 10 MG PO TABS: ORAL_TABLET | ORAL | Status: AC

## 2015-12-10 NOTE — Telephone Encounter (Signed)
Pt states that she is getting worse and would like for sarah to call her in prednisone  Best number [phone]

## 2015-12-10 NOTE — Telephone Encounter (Signed)
Please make sure she has started the abx also

## 2015-12-11 NOTE — Telephone Encounter (Signed)
SPoke with pt,. She did take the ABX. SHe has one pill left.

## 2015-12-25 ENCOUNTER — Telehealth: Payer: Self-pay

## 2015-12-25 NOTE — Telephone Encounter (Signed)
Pharm called for clarification as to the meaning of sig for alprazolam. Pt is stating that she takes it twice daily and # given reflects that, but the wording of the sig is not very clear. I searched back through OV notes until I found it spelled out clearly by Dr Patsy Lageropland on 12/31/14 - pt takes one tablet during the day as needed for anxiety and one tablet at bedtime for sleep. I gave this info to pharmacy and they will make the clarification on Rx.

## 2016-05-27 ENCOUNTER — Other Ambulatory Visit: Payer: Self-pay

## 2016-05-27 NOTE — Telephone Encounter (Signed)
St seen for exam 11/2015, labs 03/2015. Last refill 11/2015 with 5 additional.

## 2016-05-28 ENCOUNTER — Other Ambulatory Visit: Payer: Self-pay

## 2016-05-28 NOTE — Telephone Encounter (Signed)
[facility] Dr Dool[facility] pts

## 2016-05-28 NOTE — Telephone Encounter (Signed)
Pharm faxed req for RF of alprazolam. Stephanie RichesDoolittle pt last seen for this 11/23/15 and given Rx for #60 + 5 RFs.

## 2016-05-29 MED ORDER — ALPRAZOLAM 1 MG PO TABS: ORAL_TABLET | ORAL | 0 refills | Status: DC

## 2016-05-29 NOTE — Telephone Encounter (Signed)
Advised patient that [facility] [address].

## 2016-05-29 NOTE — Telephone Encounter (Signed)
Meds ordered this encounter  Medications  . ALPRAZolam (XANAX) 1 MG tablet    Sig: TAKE 1 TABLET BY MOUTH ONCE A DAY AND AT BEDTIME AS NEEDED FOR SLEEP    Dispense:  60 tablet    Refill:  0    Need office visit for additional refills.   Needs OV to establish with new PCP for additional fills.

## 2016-08-12 ENCOUNTER — Ambulatory Visit (INDEPENDENT_AMBULATORY_CARE_PROVIDER_SITE_OTHER): Payer: Self-pay | Admitting: Emergency Medicine

## 2016-08-12 VITALS — BP 131/85 | HR 81 | Temp 98.0°F | Resp 18 | Ht 65.5 in | Wt 271.6 lb

## 2016-08-12 DIAGNOSIS — Z23 Encounter for immunization: Secondary | ICD-10-CM

## 2016-08-12 DIAGNOSIS — F39 Unspecified mood [affective] disorder: Secondary | ICD-10-CM

## 2016-08-12 DIAGNOSIS — G4700 Insomnia, unspecified: Secondary | ICD-10-CM

## 2016-08-12 MED ORDER — SERTRALINE HCL 100 MG PO TABS: 100.0000 mg | ORAL_TABLET | Freq: Every day | ORAL | 3 refills | Status: DC

## 2016-08-12 MED ORDER — ALPRAZOLAM 1 MG PO TABS: ORAL_TABLET | ORAL | 0 refills | Status: DC

## 2016-08-12 NOTE — Patient Instructions (Addendum)
   IF you received an x-ray today, you will receive an invoice from [facility]. Please contact [facility] at [phone] with questions or concerns regarding your invoice.   IF you received labwork today, you will receive an invoice from [facility]. Please contact [facility] at [phone] with questions or concerns regarding your invoice.   Our billing staff will not be able to assist you with questions regarding bills from these companies.  You will be contacted with the lab results as soon as they are available. The fastest way to get your results is to activate your My Chart account. Instructions are located on the last page of this paperwork. If you have not heard from us regarding the results in 2 weeks, please contact this office.     Insomnia Insomnia is a sleep disorder that makes it difficult to fall asleep or to stay asleep. Insomnia can cause tiredness (fatigue), low energy, difficulty concentrating, mood swings, and poor performance at work or school. There are three different ways to classify insomnia:  Difficulty falling asleep.  Difficulty staying asleep.  Waking up too early in the morning. Any type of insomnia can be long-term (chronic) or short-term (acute). Both are common. Short-term insomnia usually lasts for three months or less. Chronic insomnia occurs at least three times a week for longer than three months. What are the causes? Insomnia may be caused by another condition, situation, or substance, such as:  Anxiety.  Certain medicines.  Gastroesophageal reflux disease (GERD) or other gastrointestinal conditions.  Asthma or other breathing conditions.  Restless legs syndrome, sleep apnea, or other sleep disorders.  Chronic pain.  Menopause. This may include hot flashes.  Stroke.  Abuse of alcohol, tobacco, or illegal drugs.  Depression.  Caffeine.  Neurological disorders, such as Alzheimer disease.  An overactive thyroid  (hyperthyroidism). The cause of insomnia may not be known. What increases the risk? Risk factors for insomnia include:  Gender. Women are more commonly affected than men.  Age. Insomnia is more common as you get older.  Stress. This may involve your professional or personal life.  Income. Insomnia is more common in people with lower income.  Lack of exercise.  Irregular work schedule or night shifts.  Traveling between different time zones. What are the signs or symptoms? If you have insomnia, trouble falling asleep or trouble staying asleep is the main symptom. This may lead to other symptoms, such as:  Feeling fatigued.  Feeling nervous about going to sleep.  Not feeling rested in the morning.  Having trouble concentrating.  Feeling irritable, anxious, or depressed. How is this treated? Treatment for insomnia depends on the cause. If your insomnia is caused by an underlying condition, treatment will focus on addressing the condition. Treatment may also include:  Medicines to help you sleep.  Counseling or therapy.  Lifestyle adjustments. Follow these instructions at home:  Take medicines only as directed by your health care provider.  Keep regular sleeping and waking hours. Avoid naps.  Keep a sleep diary to help you and your health care provider figure out what could be causing your insomnia. Include:  When you sleep.  When you wake up during the night.  How well you sleep.  How rested you feel the next day.  Any side effects of medicines you are taking.  What you eat and drink.  Make your bedroom a comfortable place where it is easy to fall asleep:  Put up shades or special blackout curtains to block light   from outside.  Use a white noise machine to block noise.  Keep the temperature cool.  Exercise regularly as directed by your health care provider. Avoid exercising right before bedtime.  Use relaxation techniques to manage stress. Ask your  health care provider to suggest some techniques that may work well for you. These may include:  Breathing exercises.  Routines to release muscle tension.  Visualizing peaceful scenes.  Cut back on alcohol, caffeinated beverages, and cigarettes, especially close to bedtime. These can disrupt your sleep.  Do not overeat or eat spicy foods right before bedtime. This can lead to digestive discomfort that can make it hard for you to sleep.  Limit screen use before bedtime. This includes:  Watching TV.  Using your smartphone, tablet, and computer.  Stick to a routine. This can help you fall asleep faster. Try to do a quiet activity, brush your teeth, and go to bed at the same time each night.  Get out of bed if you are still awake after 15 minutes of trying to sleep. Keep the lights down, but try reading or doing a quiet activity. When you feel sleepy, go back to bed.  Make sure that you drive carefully. Avoid driving if you feel very sleepy.  Keep all follow-up appointments as directed by your health care provider. This is important. Contact a health care provider if:  You are tired throughout the day or have trouble in your daily routine due to sleepiness.  You continue to have sleep problems or your sleep problems get worse. Get help right away if:  You have serious thoughts about hurting yourself or someone else. This information is not intended to replace advice given to you by your health care provider. Make sure you discuss any questions you have with your health care provider. Document Released: 07/31/2000 Document Revised: 01/03/2016 Document Reviewed: 05/04/2014 Elsevier Interactive Patient Education  2017 [employer]   

## 2016-08-12 NOTE — Progress Notes (Signed)
[name] 50 y.o.   Chief Complaint  Patient presents with  . Medication Refill    XANAX, ZOLOFT    HISTORY OF PRESENT ILLNESS: This is a 50 y.o. female here for medication refill.  HPI   Prior to Admission medications   Medication Sig Start Date End Date Taking? Authorizing Provider  albuterol (VENTOLIN HFA) 108 (90 Base) MCG/ACT inhaler INHALE TWO PUFFS BY MOUTH 4 TIMES DAILY AS NEEDED.  Pt prefers ventolin brand 11/23/15  Yes Leandrew Koyanagi, MD  ALPRAZolam Duanne Moron) 1 MG tablet TAKE 1 TABLET BY MOUTH ONCE A DAY AND AT BEDTIME AS NEEDED FOR SLEEP 08/12/16  Yes [facility], MD  atorvastatin (LIPITOR) 10 MG tablet Take 1 tablet (10 mg total) by mouth daily. 11/23/15  Yes Leandrew Koyanagi, MD  blood glucose meter kit and supplies KIT Dispense based on patient and insurance preference. Use up to four times daily as directed. (FOR ICD-9 250.00, 250.01). 04/02/15  Yes Todd McVeigh, PA  HYDROcodone-homatropine (HYCODAN) 5-1.5 MG/5ML syrup Take 5 mLs by mouth every 8 (eight) hours as needed for cough. Please make sure this does not have guaifenesin in it please 12/08/15  Yes Mancel Bale, PA-C  lisinopril (PRINIVIL,ZESTRIL) 2.5 MG tablet Take 1 tablet (2.5 mg total) by mouth daily. 11/23/15  Yes Leandrew Koyanagi, MD  loratadine (CLARITIN) 10 MG tablet Take 10 mg by mouth daily.   Yes Historical Provider, MD  metFORMIN (GLUCOPHAGE) 500 MG tablet Take 2 tablets (1,000 mg total) by mouth 2 (two) times daily with a meal. 11/23/15  Yes Leandrew Koyanagi, MD  montelukast (SINGULAIR) 10 MG tablet Take 1 tablet (10 mg total) by mouth at bedtime. 12/31/14  Yes Gay Filler Copland, MD  sertraline (ZOLOFT) 100 MG tablet Take 1 tablet (100 mg total) by mouth daily. 08/12/16  Yes [place], MD    Allergies  Allergen Reactions  . Codeine   . Guaifenesin Hives    Patient Active Problem List   Diagnosis Date Noted  . Type 2 diabetes mellitus without complication ([place]) 97/41/6384   . Elevated cholesterol with elevated triglycerides 04/03/2015  . Chronic bronchitis ([place]) 12/31/2014  . Smoking 01/22/2013  . Mood disorder ([place]) 06/12/2012  . Bronchospasm 06/12/2012    Past Medical History:  Diagnosis Date  . Allergy   . Anxiety   . Asthma   . Depression     Past Surgical History:  Procedure Laterality Date  . CHOLECYSTECTOMY    . TUBAL LIGATION      Social History   Social History  . Marital status: Married    Spouse name: N/A  . Number of children: N/A  . Years of education: N/A   Occupational History  . Not on file.   Social History Main Topics  . Smoking status: Current Every Day Smoker    Packs/day: 1.00    Years: 34.00    Types: Cigarettes  . Smokeless tobacco: Never Used  . Alcohol use No  . Drug use: No  . Sexual activity: Not on file   Other Topics Concern  . Not on file   Social History Narrative  . No narrative on file    Family History  Problem Relation Age of Onset  . Stroke Mother   . Heart disease Father      Review of Systems  Constitutional: Negative.   HENT: Negative.   Eyes: Negative.   Respiratory: Negative.   Cardiovascular: Negative.   Gastrointestinal: Negative.  Genitourinary: Negative.   Musculoskeletal: Negative.   Skin: Negative.   Neurological: Negative.   Endo/Heme/Allergies: Negative.   Psychiatric/Behavioral: The patient has insomnia.   All other systems reviewed and are negative.   Vitals:   08/12/16 1107  BP: 131/85  Pulse: 81  Resp: 18  Temp: 98 F (36.7 C)    Physical Exam  Constitutional: She is oriented to person, place, and time. She appears well-developed.  obese  HENT:  Head: Normocephalic and atraumatic.  Eyes: EOM are normal. Pupils are equal, round, and reactive to light.  Neck: Normal range of motion. Neck supple.  Cardiovascular: Normal rate, regular rhythm, normal heart sounds and intact distal pulses.   Pulmonary/Chest: Effort normal and breath sounds normal.   Abdominal: Soft. Bowel sounds are normal.  Musculoskeletal: Normal range of motion.  Neurological: She is alert and oriented to person, place, and time.  Skin: Skin is warm and dry. Capillary refill takes less than 2 seconds.  Psychiatric: She has a normal mood and affect. Her behavior is normal.  Vitals reviewed.    ASSESSMENT & PLAN: [name] was seen today for medication refill.  Diagnoses and all orders for this visit:  Need for prophylactic vaccination and inoculation against influenza -     Flu Vaccine QUAD 36+ mos IM  Insomnia, unspecified type -     sertraline (ZOLOFT) 100 MG tablet; Take 1 tablet (100 mg total) by mouth daily. -     Care order/instruction:  Mood disorder ([place]) -     sertraline (ZOLOFT) 100 MG tablet; Take 1 tablet (100 mg total) by mouth daily. -     Care order/instruction:  Other orders -     ALPRAZolam (XANAX) 1 MG tablet; TAKE 1 TABLET BY MOUTH ONCE A DAY AND AT BEDTIME AS NEEDED FOR SLEEP   Patient Instructions       IF you received an x-ray today, you will receive an invoice from [facility]. Please contact [facility] at [phone] with questions or concerns regarding your invoice.   IF you received labwork today, you will receive an invoice from [facility]. Please contact [facility] at [phone] with questions or concerns regarding your invoice.   Our billing staff will not be able to assist you with questions regarding bills from these companies.  You will be contacted with the lab results as soon as they are available. The fastest way to get your results is to activate your My Chart account. Instructions are located on the last page of this paperwork. If you have not heard from [place] regarding the results in 2 weeks, please contact this office.    Insomnia Insomnia is a sleep disorder that makes it difficult to fall asleep or to stay asleep. Insomnia can cause tiredness (fatigue), low energy, difficulty concentrating,  mood swings, and poor performance at work or school. There are three different ways to classify insomnia:  Difficulty falling asleep.  Difficulty staying asleep.  Waking up too early in the morning. Any type of insomnia can be long-term (chronic) or short-term (acute). Both are common. Short-term insomnia usually lasts for three months or less. Chronic insomnia occurs at least three times a week for longer than three months. What are the causes? Insomnia may be caused by another condition, situation, or substance, such as:  Anxiety.  Certain medicines.  Gastroesophageal reflux disease (GERD) or other gastrointestinal conditions.  Asthma or other breathing conditions.  Restless legs syndrome, sleep apnea, or other sleep disorders.  Chronic pain.  Menopause.  This may include hot flashes.  Stroke.  Abuse of alcohol, tobacco, or illegal drugs.  Depression.  Caffeine.  Neurological disorders, such as Alzheimer disease.  An overactive thyroid (hyperthyroidism). The cause of insomnia may not be known. What increases the risk? Risk factors for insomnia include:  Gender. Women are more commonly affected than men.  Age. Insomnia is more common as you get older.  Stress. This may involve your professional or personal life.  Income. Insomnia is more common in people with lower income.  Lack of exercise.  Irregular work schedule or night shifts.  Traveling between different time zones. What are the signs or symptoms? If you have insomnia, trouble falling asleep or trouble staying asleep is the main symptom. This may lead to other symptoms, such as:  Feeling fatigued.  Feeling nervous about going to sleep.  Not feeling rested in the morning.  Having trouble concentrating.  Feeling irritable, anxious, or depressed. How is this treated? Treatment for insomnia depends on the cause. If your insomnia is caused by an underlying condition, treatment will focus on  addressing the condition. Treatment may also include:  Medicines to help you sleep.  Counseling or therapy.  Lifestyle adjustments. Follow these instructions at home:  Take medicines only as directed by your health care provider.  Keep regular sleeping and waking hours. Avoid naps.  Keep a sleep diary to help you and your health care provider figure out what could be causing your insomnia. Include:  When you sleep.  When you wake up during the night.  How well you sleep.  How rested you feel the next day.  Any side effects of medicines you are taking.  What you eat and drink.  Make your bedroom a comfortable place where it is easy to fall asleep:  Put up shades or special blackout curtains to block light from outside.  Use a white noise machine to block noise.  Keep the temperature cool.  Exercise regularly as directed by your health care provider. Avoid exercising right before bedtime.  Use relaxation techniques to manage stress. Ask your health care provider to suggest some techniques that may work well for you. These may include:  Breathing exercises.  Routines to release muscle tension.  Visualizing peaceful scenes.  Cut back on alcohol, caffeinated beverages, and cigarettes, especially close to bedtime. These can disrupt your sleep.  Do not overeat or eat spicy foods right before bedtime. This can lead to digestive discomfort that can make it hard for you to sleep.  Limit screen use before bedtime. This includes:  Watching TV.  Using your smartphone, tablet, and computer.  Stick to a routine. This can help you fall asleep faster. Try to do a quiet activity, brush your teeth, and go to bed at the same time each night.  Get out of bed if you are still awake after 15 minutes of trying to sleep. Keep the lights down, but try reading or doing a quiet activity. When you feel sleepy, go back to bed.  Make sure that you drive carefully. Avoid driving if you  feel very sleepy.  Keep all follow-up appointments as directed by your health care provider. This is important. Contact a health care provider if:  You are tired throughout the day or have trouble in your daily routine due to sleepiness.  You continue to have sleep problems or your sleep problems get worse. Get help right away if:  You have serious thoughts about hurting yourself or someone else. This  information is not intended to replace advice given to you by your health care provider. Make sure you discuss any questions you have with your health care provider. Document Released: 07/31/2000 Document Revised: 01/03/2016 Document Reviewed: 05/04/2014 Elsevier Interactive Patient Education  2017 Elsevier Inc.      Agustina Caroli, MD [facility]

## 2016-08-14 ENCOUNTER — Telehealth: Payer: Self-pay

## 2016-08-17 NOTE — Telephone Encounter (Signed)
error 

## 2016-09-26 ENCOUNTER — Ambulatory Visit (INDEPENDENT_AMBULATORY_CARE_PROVIDER_SITE_OTHER): Payer: Self-pay | Admitting: Physician Assistant

## 2016-09-26 DIAGNOSIS — F419 Anxiety disorder, unspecified: Secondary | ICD-10-CM

## 2016-09-26 DIAGNOSIS — J45909 Unspecified asthma, uncomplicated: Secondary | ICD-10-CM

## 2016-09-26 MED ORDER — ALBUTEROL SULFATE HFA 108 (90 BASE) MCG/ACT IN AERS: INHALATION_SPRAY | RESPIRATORY_TRACT | 11 refills | Status: DC

## 2016-09-26 MED ORDER — BECLOMETHASONE DIPROPIONATE 40 MCG/ACT IN AERS: 1.0000 | INHALATION_SPRAY | Freq: Two times a day (BID) | RESPIRATORY_TRACT | 6 refills | Status: DC

## 2016-09-26 MED ORDER — SERTRALINE HCL 100 MG PO TABS: 100.0000 mg | ORAL_TABLET | Freq: Every day | ORAL | 1 refills | Status: DC

## 2016-09-26 MED ORDER — ALPRAZOLAM 1 MG PO TABS: ORAL_TABLET | ORAL | 0 refills | Status: DC

## 2016-09-26 NOTE — Progress Notes (Signed)
[name]  MRN: [id number] DOB: 1966/06/18  Subjective:  [name] is a 51 y.o. female seen in office today for a chief complaint of medication refill for xanax, zoloft, and albuterol inhaler.   Anxiety: Pt has been on zoloft 135m daily and xanax 140mBID for the past 20 years. Uses xanax for the panic attacks and insomnia. Notes she is using xanax BID as her husband is going through a lot in the ICU and the day her dad died is coming up. She was followed by Dr. DoLaney Pastoror anxiety but he has retired now. She has tried Ambien for insomnia but it did not work. Notes the xanax works wonders for her.   Asthma: Has been using albuterol inhaler BID for the past 20 years. Notes she has tried advair but it is too expensive. She refuses seeing an asthma specialist as she states she cannot afford to do so. Pt smokes 1 ppd.   Review of Systems  Constitutional: Negative for chills, diaphoresis, fatigue and fever.  Respiratory: Negative for cough, chest tightness and shortness of breath.   Cardiovascular: Negative for chest pain and palpitations.  Gastrointestinal: Negative for diarrhea, nausea and vomiting.  Neurological: Negative for dizziness and light-headedness.    Patient Active Problem List   Diagnosis Date Noted  . Type 2 diabetes mellitus without complication ([place]45/10/8880. Elevated cholesterol with elevated triglycerides 04/03/2015  . Chronic bronchitis ([place]05/16/2016  . Smoking 01/22/2013  . Mood disorder ([place]10/27/2013  . Bronchospasm 06/12/2012    Current Outpatient Prescriptions on File Prior to Visit  Medication Sig Dispense Refill  . albuterol (VENTOLIN HFA) 108 (90 Base) MCG/ACT inhaler INHALE TWO PUFFS BY MOUTH 4 TIMES DAILY AS NEEDED.  Pt prefers ventolin brand 2 each 11  . ALPRAZolam (XANAX) 1 MG tablet TAKE 1 TABLET BY MOUTH ONCE A DAY AND AT BEDTIME AS NEEDED FOR SLEEP 60 tablet 0  . atorvastatin (LIPITOR) 10 MG tablet Take 1 tablet (10 mg total) by  mouth daily. 90 tablet 3  . blood glucose meter kit and supplies KIT Dispense based on patient and insurance preference. Use up to four times daily as directed. (FOR ICD-9 250.00, 250.01). 1 each 0  . HYDROcodone-homatropine (HYCODAN) 5-1.5 MG/5ML syrup Take 5 mLs by mouth every 8 (eight) hours as needed for cough. Please make sure this does not have guaifenesin in it please 120 mL 0  . lisinopril (PRINIVIL,ZESTRIL) 2.5 MG tablet Take 1 tablet (2.5 mg total) by mouth daily. 90 tablet 3  . loratadine (CLARITIN) 10 MG tablet Take 10 mg by mouth daily.    . metFORMIN (GLUCOPHAGE) 500 MG tablet Take 2 tablets (1,000 mg total) by mouth 2 (two) times daily with a meal. 180 tablet 3  . montelukast (SINGULAIR) 10 MG tablet Take 1 tablet (10 mg total) by mouth at bedtime. 90 tablet 3  . sertraline (ZOLOFT) 100 MG tablet Take 1 tablet (100 mg total) by mouth daily. 90 tablet 3   No current facility-administered medications on file prior to visit.     Allergies  Allergen Reactions  . Codeine   . Guaifenesin Hives     Objective:  BP 132/82 (BP Location: Right Arm, Patient Position: Sitting, Cuff Size: Normal)   Pulse 88   Temp 97.3 F (36.3 C) (Oral)   Resp 17   Ht 5' 6.5" (1.689 m)   Wt 264 lb (119.7 kg)   SpO2 95%   BMI 41.97 kg/m  Physical Exam  Constitutional: She is oriented to person, place, and time.  Well developed, well nourished female.   HENT:  Head: Normocephalic and atraumatic.  Eyes: Conjunctivae are normal.  Neck: Normal range of motion.  Cardiovascular: Normal rate, regular rhythm and normal heart sounds.   Pulmonary/Chest: Effort normal and breath sounds normal.  Neurological: She is alert and oriented to person, place, and time. Gait normal.  Skin: Skin is warm and dry.  Psychiatric: Affect normal.  Tearful and mildly agitated.    Vitals reviewed.     Assessment and Plan :  1. Anxiety -I had a thorough discussion with patient as to how I am unwilling to  continue prescribing xanax 20m BID for anxiety and sleep as it is no longer indicated for long term treatment of these disorders due to its habit forming capabilities. I have agreed to give her one month prescription of xanax and six months of zoloft and a referral to psychiatry as her anxiety appears to be uncontrolled on both zoloft and xanax.  - ALPRAZolam (XANAX) 1 MG tablet; TAKE 1 TABLET BY MOUTH ONCE A DAY AND AT BEDTIME AS NEEDED FOR SLEEP  Dispense: 60 tablet; Refill: 0 - sertraline (ZOLOFT) 100 MG tablet; Take 1 tablet (100 mg total) by mouth daily.  Dispense: 90 tablet; Refill: 1 - Ambulatory referral to Psychiatry  2. Uncomplicated asthma, unspecified asthma severity, unspecified whether persistent -Uncontrolled, due to frequency of albuterol inhaler use, pt has been offered both a referral to pulmonology for further work up, which she refuses and prescription for maintenance inhaler, which she states she will consider filling if it is not too expensive. Instructed to follow up in 6 months for reevaluation.  - beclomethasone (QVAR) 40 MCG/ACT inhaler; Inhale 1 puff into the lungs 2 (two) times daily.  Dispense: 1 Inhaler; Refill: 6 - albuterol (VENTOLIN HFA) 108 (90 Base) MCG/ACT inhaler; INHALE TWO PUFFS BY MOUTH 4 TIMES DAILY AS NEEDED.  Pt prefers ventolin brand  Dispense: 2 each; Refill: 1[place]PA-C  [facility] 09/26/2016 12:13 PM

## 2016-09-26 NOTE — Patient Instructions (Addendum)
For anxiety, continue zoloft daily. As discussed in office today, I am not comfortable continuing prescribing xanax for anxiety and sleep as it is not recommended to use long term due to its habit forming nature. I will give you a months worth of prescription and a referral for psychiatry.   For asthma, try QVAR daily if you can afford it this will hopefully decrease the amount of albuterol use. You have enough refills to get you through 6 months. Please follow up in 6 months for reevaluation.   Thank you for letting me participate in your health and well being.    IF you received an x-ray today, you will receive an [facility]. Please contact [facility] at [phone] with questions or concerns regarding your invoice.   IF you received labwork today, you will receive an [facility] from [facility]. Please contact [facility] at [phone] with questions or concerns regarding your invoice.   Our billing staff will not be able to assist you with questions regarding bills from these companies.  You will be contacted with the lab results as soon as they are available. The fastest way to get your results is to activate your My Chart account. Instructions are located on the last page of this paperwork. If you have not heard from us regarding the results in 2 weeks, please contact this office.

## 2016-09-29 ENCOUNTER — Telehealth: Payer: Self-pay

## 2016-09-29 NOTE — Telephone Encounter (Signed)
[facility] to accommodate for pt's self pay status.

## 2016-11-02 ENCOUNTER — Other Ambulatory Visit: Payer: Self-pay | Admitting: Physician Assistant

## 2016-11-02 DIAGNOSIS — F419 Anxiety disorder, unspecified: Secondary | ICD-10-CM

## 2016-11-04 ENCOUNTER — Telehealth: Payer: Self-pay | Admitting: *Deleted

## 2016-11-04 NOTE — Telephone Encounter (Signed)
Pt advised Will make appt when she is able to " afford it"

## 2016-11-04 NOTE — Telephone Encounter (Signed)
LM FOR PATIENT TO CALL BACK.  Please inform patient that she will need to be seen for refills.  None will be provided until she establishes care.

## 2019-09-27 ENCOUNTER — Emergency Department (HOSPITAL_COMMUNITY)
Admission: EM | Admit: 2019-09-27 | Discharge: 2019-09-28 | Disposition: A | Discharge status: Home / Self Care | Payer: Self-pay | Attending: Emergency Medicine | Admitting: Emergency Medicine

## 2019-09-27 ENCOUNTER — Other Ambulatory Visit: Payer: Self-pay

## 2019-09-27 ENCOUNTER — Encounter (HOSPITAL_COMMUNITY): Payer: Self-pay | Admitting: *Deleted

## 2019-09-27 DIAGNOSIS — Z9049 Acquired absence of other specified parts of digestive tract: Secondary | ICD-10-CM | POA: Insufficient documentation

## 2019-09-27 DIAGNOSIS — L0231 Cutaneous abscess of buttock: Secondary | ICD-10-CM | POA: Insufficient documentation

## 2019-09-27 DIAGNOSIS — F17210 Nicotine dependence, cigarettes, uncomplicated: Secondary | ICD-10-CM | POA: Insufficient documentation

## 2019-09-27 DIAGNOSIS — E1165 Type 2 diabetes mellitus with hyperglycemia: Secondary | ICD-10-CM | POA: Insufficient documentation

## 2019-09-27 DIAGNOSIS — R739 Hyperglycemia, unspecified: Secondary | ICD-10-CM

## 2019-09-27 DIAGNOSIS — Z79899 Other long term (current) drug therapy: Secondary | ICD-10-CM | POA: Insufficient documentation

## 2019-09-27 DIAGNOSIS — Z7984 Long term (current) use of oral hypoglycemic drugs: Secondary | ICD-10-CM | POA: Insufficient documentation

## 2019-09-27 DIAGNOSIS — J45909 Unspecified asthma, uncomplicated: Secondary | ICD-10-CM | POA: Insufficient documentation

## 2019-09-27 HISTORY — DX: Type 2 diabetes mellitus without complications: E11.9

## 2019-09-27 LAB — CBC WITH DIFFERENTIAL/PLATELET
Abs Immature Granulocytes: 0.12 10*3/uL — ABNORMAL HIGH (ref 0.00–0.07)
Basophils Absolute: 0.1 10*3/uL (ref 0.0–0.1)
Basophils Relative: 0 %
Eosinophils Absolute: 0.1 10*3/uL (ref 0.0–0.5)
Eosinophils Relative: 0 %
HCT: 49.8 % — ABNORMAL HIGH (ref 36.0–46.0)
Hemoglobin: 16.8 g/dL — ABNORMAL HIGH (ref 12.0–15.0)
Immature Granulocytes: 1 %
Lymphocytes Relative: 6 %
Lymphs Abs: 1.2 10*3/uL (ref 0.7–4.0)
MCH: 30.7 pg (ref 26.0–34.0)
MCHC: 33.7 g/dL (ref 30.0–36.0)
MCV: 90.9 fL (ref 80.0–100.0)
Monocytes Absolute: 1.1 10*3/uL — ABNORMAL HIGH (ref 0.1–1.0)
Monocytes Relative: 6 %
Neutro Abs: 15.9 10*3/uL — ABNORMAL HIGH (ref 1.7–7.7)
Neutrophils Relative %: 87 %
Platelets: 143 10*3/uL — ABNORMAL LOW (ref 150–400)
RBC: 5.48 MIL/uL — ABNORMAL HIGH (ref 3.87–5.11)
RDW: 12.1 % (ref 11.5–15.5)
WBC: 18.4 10*3/uL — ABNORMAL HIGH (ref 4.0–10.5)
nRBC: 0 % (ref 0.0–0.2)

## 2019-09-27 NOTE — ED Triage Notes (Signed)
Pt reporting an abscess on her labia area and surrounding irritation. Hx of diabetes, glucose has been 150-200

## 2019-09-28 ENCOUNTER — Encounter (HOSPITAL_COMMUNITY): Payer: Self-pay

## 2019-09-28 ENCOUNTER — Emergency Department (HOSPITAL_COMMUNITY): Payer: Self-pay

## 2019-09-28 LAB — BASIC METABOLIC PANEL
Anion gap: 15 (ref 5–15)
BUN: 15 mg/dL (ref 6–20)
CO2: 19 mmol/L — ABNORMAL LOW (ref 22–32)
Calcium: 9 mg/dL (ref 8.9–10.3)
Chloride: 100 mmol/L (ref 98–111)
Creatinine, Ser: 0.82 mg/dL (ref 0.44–1.00)
GFR calc Af Amer: >60 mL/min (ref >60)
GFR calc non Af Amer: >60 mL/min (ref >60)
Glucose, Bld: 433 mg/dL — ABNORMAL HIGH (ref 70–99)
Potassium: 3.8 mmol/L (ref 3.5–5.1)
Sodium: 134 mmol/L — ABNORMAL LOW (ref 135–145)

## 2019-09-28 LAB — LACTIC ACID, PLASMA: Lactic Acid, Venous: 1.9 mmol/L (ref 0.5–1.9)

## 2019-09-28 MED ORDER — INSULIN ASPART 100 UNIT/ML ~~LOC~~ SOLN
15.0000 [IU] | Freq: Once | SUBCUTANEOUS | Status: AC
  Administered 2019-09-28: 05:00:00 15 [IU] via SUBCUTANEOUS
  Filled 2019-09-28: qty 0.15

## 2019-09-28 MED ORDER — IOHEXOL 300 MG/ML  SOLN
100.0000 mL | Freq: Once | INTRAMUSCULAR | Status: AC | PRN
  Administered 2019-09-28: 100 mL via INTRAVENOUS

## 2019-09-28 MED ORDER — CLINDAMYCIN HCL 150 MG PO CAPS: 300.0000 mg | ORAL_CAPSULE | Freq: Four times a day (QID) | ORAL | 0 refills | Status: DC

## 2019-09-28 MED ORDER — CLINDAMYCIN PHOSPHATE 900 MG/50ML IV SOLN
900.0000 mg | Freq: Once | INTRAVENOUS | Status: AC
  Administered 2019-09-28: 05:00:00 900 mg via INTRAVENOUS
  Filled 2019-09-28: qty 50

## 2019-09-28 MED ORDER — LIDOCAINE HCL 2 % IJ SOLN
20.0000 mL | Freq: Once | INTRAMUSCULAR | Status: AC
  Administered 2019-09-28: 400 mg
  Filled 2019-09-28: qty 20

## 2019-09-28 MED ORDER — SODIUM CHLORIDE (PF) 0.9 % IJ SOLN
INTRAMUSCULAR | Status: AC
  Filled 2019-09-28: qty 50

## 2019-09-28 NOTE — ED Notes (Signed)
Lidocaine at bedside.

## 2019-09-28 NOTE — ED Provider Notes (Signed)
[facility] DEPT Provider Note   CSN: [id number] Arrival date & time: 09/27/19  2109     History Chief Complaint  Patient presents with  . Abscess    [name] is a 54 y.o. female.  Patient presents with complaints of an abscess.  She reports that she has had multiple "boils" in the past.  Over the last couple of days she has developed a tender and swollen area in the left groin and buttock area.  She has not had any drainage.        Past Medical History:  Diagnosis Date  . Allergy   . Anxiety   . Asthma   . Depression   . Diabetes mellitus without complication [facility])     Patient Active Problem List   Diagnosis Date Noted  . Type 2 diabetes mellitus without complication ([place]) 96/75/9163  . Elevated cholesterol with elevated triglycerides 04/03/2015  . Chronic bronchitis ([place]) 12/31/2014  . Smoking 01/22/2013  . Mood disorder ([place]) 06/12/2012  . Bronchospasm 06/12/2012    Past Surgical History:  Procedure Laterality Date  . CHOLECYSTECTOMY    . TUBAL LIGATION       OB History   No obstetric history on file.     Family History  Problem Relation Age of Onset  . Stroke Mother   . Heart disease Father     Social History   Tobacco Use  . Smoking status: Current Every Day Smoker    Packs/day: 1.00    Years: 34.00    Pack years: 34.00    Types: Cigarettes  . Smokeless tobacco: Never Used  Substance Use Topics  . Alcohol use: No    Alcohol/week: 0.0 standard drinks  . Drug use: No    Home Medications Prior to Admission medications   Medication Sig Start Date End Date Taking? Authorizing Provider  albuterol (VENTOLIN HFA) 108 (90 Base) MCG/ACT inhaler INHALE TWO PUFFS BY MOUTH 4 TIMES DAILY AS NEEDED.  Pt prefers ventolin brand 09/26/16   Tenna Delaine D, PA-C  ALPRAZolam Duanne Moron) 1 MG tablet TAKE 1 TABLET BY MOUTH ONCE A DAY AND AT BEDTIME AS NEEDED FOR SLEEP 09/26/16   Tenna Delaine D, PA-C  atorvastatin  (LIPITOR) 10 MG tablet Take 1 tablet (10 mg total) by mouth daily. 11/23/15   Leandrew Koyanagi, MD  beclomethasone (QVAR) 40 MCG/ACT inhaler Inhale 1 puff into the lungs 2 (two) times daily. 09/26/16   Tenna Delaine D, PA-C  blood glucose meter kit and supplies KIT Dispense based on patient and insurance preference. Use up to four times daily as directed. (FOR ICD-9 250.00, 250.01). 04/02/15   McVeigh, Sherren Mocha, PA  HYDROcodone-homatropine (HYCODAN) 5-1.5 MG/5ML syrup Take 5 mLs by mouth every 8 (eight) hours as needed for cough. Please make sure this does not have guaifenesin in it please 12/08/15   Weber, Damaris Hippo, PA-C  lisinopril (PRINIVIL,ZESTRIL) 2.5 MG tablet Take 1 tablet (2.5 mg total) by mouth daily. 11/23/15   Leandrew Koyanagi, MD  loratadine (CLARITIN) 10 MG tablet Take 10 mg by mouth daily.    [provider]  metFORMIN (GLUCOPHAGE) 500 MG tablet Take 2 tablets (1,000 mg total) by mouth 2 (two) times daily with a meal. 11/23/15   Leandrew Koyanagi, MD  montelukast (SINGULAIR) 10 MG tablet Take 1 tablet (10 mg total) by mouth at bedtime. 12/31/14   Copland, Gay Filler, MD  sertraline (ZOLOFT) 100 MG tablet Take 1 tablet (100 mg total) by  mouth daily. 09/26/16   Tenna Delaine D, PA-C    Allergies    Codeine and Guaifenesin  Review of Systems   Review of Systems  Skin: Positive for wound.  All other systems reviewed and are negative.   Physical Exam Updated Vital Signs BP (!) 144/92 (BP Location: Right Arm)   Pulse (!) 118   Temp 98.5 F (36.9 C) (Oral)   Resp 18   SpO2 94%   Physical Exam Vitals and nursing note reviewed.  Constitutional:      General: She is not in acute distress.    Appearance: Normal appearance. She is well-developed.  HENT:     Head: Normocephalic and atraumatic.     Right Ear: Hearing normal.     Left Ear: Hearing normal.     Nose: Nose normal.  Eyes:     Conjunctiva/sclera: Conjunctivae normal.     Pupils: Pupils are equal, round, and  reactive to light.  Cardiovascular:     Rate and Rhythm: Regular rhythm.     Heart sounds: S1 normal and S2 normal. No murmur. No friction rub. No gallop.   Pulmonary:     Effort: Pulmonary effort is normal. No respiratory distress.     Breath sounds: Normal breath sounds.  Chest:     Chest wall: No tenderness.  Abdominal:     General: Bowel sounds are normal.     Palpations: Abdomen is soft.     Tenderness: There is no abdominal tenderness. There is no guarding or rebound. Negative signs include Murphy's sign and McBurney's sign.     Hernia: No hernia is present.  Genitourinary:      Comments: Tender, swollen, erythematous and indurated area right buttock adjacent to perineum - swelling up to right labia, no labial induration or fluctuance. Musculoskeletal:        General: Normal range of motion.     Cervical back: Normal range of motion and neck supple.  Skin:    General: Skin is warm and dry.     Findings: No rash.  Neurological:     Mental Status: She is alert and oriented to person, place, and time.     GCS: GCS eye subscore is 4. GCS verbal subscore is 5. GCS motor subscore is 6.     Cranial Nerves: No cranial nerve deficit.     Sensory: No sensory deficit.     Coordination: Coordination normal.  Psychiatric:        Speech: Speech normal.        Behavior: Behavior normal.        Thought Content: Thought content normal.     ED Results / Procedures / Treatments   Labs (all labs ordered are listed, but only abnormal results are displayed) Labs Reviewed  CBC WITH DIFFERENTIAL/PLATELET - Abnormal; Notable for the following components:      Result Value   WBC 18.4 (*)    RBC 5.48 (*)    Hemoglobin 16.8 (*)    HCT 49.8 (*)    Platelets 143 (*)    Neutro Abs 15.9 (*)    Monocytes Absolute 1.1 (*)    Abs Immature Granulocytes 0.12 (*)    All other components within normal limits  BASIC METABOLIC PANEL - Abnormal; Notable for the following components:   Sodium 134 (*)     CO2 19 (*)    Glucose, Bld 433 (*)    All other components within normal limits  CULTURE, BLOOD (ROUTINE X 2)  CULTURE, BLOOD (ROUTINE X 2)  LACTIC ACID, PLASMA    EKG None  Radiology CT ABDOMEN PELVIS W CONTRAST  Result Date: 09/28/2019 CLINICAL DATA:  Abscess on the labia, irritation EXAM: CT ABDOMEN AND PELVIS WITH CONTRAST TECHNIQUE: Multidetector CT imaging of the abdomen and pelvis was performed using the standard protocol following bolus administration of intravenous contrast. CONTRAST:  181m OMNIPAQUE IOHEXOL 300 MG/ML  SOLN COMPARISON:  None. FINDINGS: Lower chest: The visualized heart size within normal limits. No pericardial fluid/thickening. No hiatal hernia. The visualized portions of the lungs are clear. Hepatobiliary: The liver is normal in density without focal abnormality.The main portal vein is patent. The patient is status post cholecystectomy. No biliary ductal dilation. Pancreas: Unremarkable. No pancreatic ductal dilatation or surrounding inflammatory changes. Spleen: Normal in size without focal abnormality. Adrenals/Urinary Tract: Both adrenal glands appear normal. The kidneys and collecting system appear normal without evidence of urinary tract calculus or hydronephrosis. Bladder is unremarkable. Stomach/Bowel: The stomach, small bowel, and colon are normal in appearance. No inflammatory changes, wall thickening, or obstructive findings.The appendix is normal. Vascular/Lymphatic: There are no enlarged mesenteric, retroperitoneal, or pelvic lymph nodes. No significant vascular findings are present. Reproductive: The uterus and adnexa are unremarkable. Other: Along the right labial and perineal soft tissues there is a extensive amount of soft tissue thickening and fat stranding changes which extend into the right medial buttocks and anterior labral soft tissues. A loculated fluid collection seen within the left posterior labial fold measuring 5.3 x 1.6 by 2.1 cm. There are  small foci of air either seen adjacent to or within the collection. No subcutaneous emphysema seen within the surrounding soft tissues however. There is non loculated fluid which extends anteriorly into the left labial fold. For Musculoskeletal: No acute or significant osseous findings. IMPRESSION: Posterior left labial/perineal soft tissue abscess measuring 5.3 x 1.6 x 2.1 cm with significant surrounding inflammatory changes. There is non loculated fluid/phlegmon which extends anteriorly within the labial fold. No subcutaneous emphysema however is noted. Electronically Signed   By: BPrudencio PairM.D.   On: 09/28/2019 01:43    Procedures ..Marland Kitchenncision and Drainage  Date/Time: 09/28/2019 3:24 AM Performed by: POrpah Greek MD Authorized by: POrpah Greek MD   Consent:    Consent obtained:  Verbal   Consent given by:  Patient   Risks discussed:  Bleeding, incomplete drainage and pain Universal protocol:    Procedure explained and questions answered to patient or proxy's satisfaction: yes     Relevant documents present and verified: yes     Test results available and properly labeled: yes     Imaging studies available: yes     Required blood products, implants, devices, and special equipment available: yes     Site/side marked: yes     Immediately prior to procedure a time out was called: yes     Patient identity confirmed:  Verbally with patient Location:    Type:  Abscess   Size:  5cm   Location:  Anogenital   Anogenital location:  Perianal Pre-procedure details:    Skin preparation:  Betadine Anesthesia (see MAR for exact dosages):    Anesthesia method:  Local infiltration   Local anesthetic:  Lidocaine 2% w/o epi Procedure type:    Complexity:  Simple Procedure details:    Incision types:  Single straight   Incision depth:  Subcutaneous   Scalpel blade:  11   Wound management:  Probed and deloculated, irrigated with saline and extensive cleaning  Drainage:   Purulent   Drainage amount:  Copious   Packing materials:  1/2 in iodoform gauze Post-procedure details:    Patient tolerance of procedure:  Tolerated well, no immediate complications   (including critical care time)  Medications Ordered in ED Medications  sodium chloride (PF) 0.9 % injection (has no administration in time range)  lidocaine (XYLOCAINE) 2 % (with pres) injection 400 mg (has no administration in time range)  insulin aspart (novoLOG) injection 15 Units (has no administration in time range)  clindamycin (CLEOCIN) IVPB 900 mg (has no administration in time range)  iohexol (OMNIPAQUE) 300 MG/ML solution 100 mL (100 mLs Intravenous Contrast Given 09/28/19 0116)    ED Course  I have reviewed the triage vital signs and the nursing notes.  Pertinent labs & imaging results that were available during my care of the patient were reviewed by me and considered in my medical decision making (see chart for details).    MDM Rules/Calculators/A&P                      Patient with history of diabetes presents to the emergency department with complaints of 2 to 3 days of progressively worsening pain in the area of her right labia and buttock.  Examination reveals erythema, induration, fluctuance consistent with abscess.  CT scan performed to further evaluate.  No deep pelvic infection, no evidence of Fournier's gangrene.  She does have a 5 cm abscess noted.  Abscess drained.  There is a large amount of foul-smelling purulent drainage.  Discussed hospitalization with the patient.  Discussed with her the possibility of significantly worsening infection including life-threatening infection.  She does not wish to be admitted.  I informed her that medically, admission is necessary but she declines.  She has a fear of hospitals and does not want to be admitted.  She understands the risks of leaving without further treatment.  She is not incapacitated, can give informed refusal for admission.  Will  provide next best treatment plan including outpatient antibiotics and return in 2 days for packing removal.  Packing was placed because of her wish to be discharged, will help provide continuously draining tract.  Patient understands that if the area becomes worse or if she develops a high fever, she needs to return to the emergency department.  Final Clinical Impression(s) / ED Diagnoses Final diagnoses:  Abscess of buttock, right  Hyperglycemia    Rx / DC Orders ED Discharge Orders    None       Maricruz Lucero, Gwenyth Allegra, MD 09/28/19 0330

## 2019-09-28 NOTE — ED Notes (Addendum)
Pt called out to go to the restroom. This writer went into the room and removed the ABD pad that was use to collect the blood coming from the incision. Immediately after, pt stated "I'm already peeing." This writer let the pt know that she is able to ambulate to the restroom. Pt refused and began urinating on the bed. Pt was provided with supplies to clean herself up and a new pad for the bed. Pt also instructed to be careful due to the packing coming from the incision.

## 2019-10-02 ENCOUNTER — Inpatient Hospital Stay (HOSPITAL_COMMUNITY)
Admission: EM | Admit: 2019-10-02 | Discharge: 2019-10-12 | DRG: 853 | Disposition: A | Discharge status: Home Health | Payer: Self-pay | Attending: Internal Medicine | Admitting: Internal Medicine

## 2019-10-02 ENCOUNTER — Encounter (HOSPITAL_COMMUNITY): Payer: Self-pay | Admitting: Emergency Medicine

## 2019-10-02 ENCOUNTER — Emergency Department (HOSPITAL_COMMUNITY): Payer: Self-pay

## 2019-10-02 DIAGNOSIS — R6521 Severe sepsis with septic shock: Secondary | ICD-10-CM | POA: Diagnosis present

## 2019-10-02 DIAGNOSIS — R062 Wheezing: Secondary | ICD-10-CM

## 2019-10-02 DIAGNOSIS — J9600 Acute respiratory failure, unspecified whether with hypoxia or hypercapnia: Secondary | ICD-10-CM

## 2019-10-02 DIAGNOSIS — Z9049 Acquired absence of other specified parts of digestive tract: Secondary | ICD-10-CM

## 2019-10-02 DIAGNOSIS — X58XXXA Exposure to other specified factors, initial encounter: Secondary | ICD-10-CM | POA: Diagnosis not present

## 2019-10-02 DIAGNOSIS — R410 Disorientation, unspecified: Secondary | ICD-10-CM | POA: Diagnosis not present

## 2019-10-02 DIAGNOSIS — F39 Unspecified mood [affective] disorder: Secondary | ICD-10-CM | POA: Diagnosis present

## 2019-10-02 DIAGNOSIS — N179 Acute kidney failure, unspecified: Secondary | ICD-10-CM | POA: Diagnosis present

## 2019-10-02 DIAGNOSIS — E861 Hypovolemia: Secondary | ICD-10-CM | POA: Diagnosis present

## 2019-10-02 DIAGNOSIS — E1110 Type 2 diabetes mellitus with ketoacidosis without coma: Secondary | ICD-10-CM | POA: Diagnosis present

## 2019-10-02 DIAGNOSIS — Z452 Encounter for adjustment and management of vascular access device: Secondary | ICD-10-CM

## 2019-10-02 DIAGNOSIS — G9341 Metabolic encephalopathy: Secondary | ICD-10-CM | POA: Diagnosis present

## 2019-10-02 DIAGNOSIS — J80 Acute respiratory distress syndrome: Secondary | ICD-10-CM

## 2019-10-02 DIAGNOSIS — L02215 Cutaneous abscess of perineum: Secondary | ICD-10-CM | POA: Diagnosis present

## 2019-10-02 DIAGNOSIS — T886XXA Anaphylactic reaction due to adverse effect of correct drug or medicament properly administered, initial encounter: Secondary | ICD-10-CM | POA: Diagnosis not present

## 2019-10-02 DIAGNOSIS — E7800 Pure hypercholesterolemia, unspecified: Secondary | ICD-10-CM | POA: Diagnosis present

## 2019-10-02 DIAGNOSIS — Z823 Family history of stroke: Secondary | ICD-10-CM

## 2019-10-02 DIAGNOSIS — M6282 Rhabdomyolysis: Secondary | ICD-10-CM | POA: Diagnosis present

## 2019-10-02 DIAGNOSIS — Z885 Allergy status to narcotic agent status: Secondary | ICD-10-CM

## 2019-10-02 DIAGNOSIS — N340 Urethral abscess: Secondary | ICD-10-CM | POA: Diagnosis present

## 2019-10-02 DIAGNOSIS — Z8249 Family history of ischemic heart disease and other diseases of the circulatory system: Secondary | ICD-10-CM

## 2019-10-02 DIAGNOSIS — J9811 Atelectasis: Secondary | ICD-10-CM | POA: Diagnosis not present

## 2019-10-02 DIAGNOSIS — E876 Hypokalemia: Secondary | ICD-10-CM | POA: Diagnosis present

## 2019-10-02 DIAGNOSIS — Z1611 Resistance to penicillins: Secondary | ICD-10-CM | POA: Diagnosis present

## 2019-10-02 DIAGNOSIS — E878 Other disorders of electrolyte and fluid balance, not elsewhere classified: Secondary | ICD-10-CM | POA: Diagnosis not present

## 2019-10-02 DIAGNOSIS — A4159 Other Gram-negative sepsis: Principal | ICD-10-CM | POA: Diagnosis present

## 2019-10-02 DIAGNOSIS — Z6839 Body mass index (BMI) 39.0-39.9, adult: Secondary | ICD-10-CM

## 2019-10-02 DIAGNOSIS — I10 Essential (primary) hypertension: Secondary | ICD-10-CM | POA: Diagnosis present

## 2019-10-02 DIAGNOSIS — R05 Cough: Secondary | ICD-10-CM

## 2019-10-02 DIAGNOSIS — Z79891 Long term (current) use of opiate analgesic: Secondary | ICD-10-CM

## 2019-10-02 DIAGNOSIS — E1100 Type 2 diabetes mellitus with hyperosmolarity without nonketotic hyperglycemic-hyperosmolar coma (NKHHC): Secondary | ICD-10-CM | POA: Diagnosis present

## 2019-10-02 DIAGNOSIS — F17210 Nicotine dependence, cigarettes, uncomplicated: Secondary | ICD-10-CM | POA: Diagnosis present

## 2019-10-02 DIAGNOSIS — I743 Embolism and thrombosis of arteries of the lower extremities: Secondary | ICD-10-CM | POA: Diagnosis present

## 2019-10-02 DIAGNOSIS — Z7984 Long term (current) use of oral hypoglycemic drugs: Secondary | ICD-10-CM

## 2019-10-02 DIAGNOSIS — T79A21A Traumatic compartment syndrome of right lower extremity, initial encounter: Secondary | ICD-10-CM | POA: Diagnosis present

## 2019-10-02 DIAGNOSIS — E872 Acidosis: Secondary | ICD-10-CM

## 2019-10-02 DIAGNOSIS — F419 Anxiety disorder, unspecified: Secondary | ICD-10-CM | POA: Diagnosis present

## 2019-10-02 DIAGNOSIS — Z888 Allergy status to other drugs, medicaments and biological substances status: Secondary | ICD-10-CM

## 2019-10-02 DIAGNOSIS — A419 Sepsis, unspecified organism: Secondary | ICD-10-CM | POA: Diagnosis present

## 2019-10-02 DIAGNOSIS — F329 Major depressive disorder, single episode, unspecified: Secondary | ICD-10-CM | POA: Diagnosis present

## 2019-10-02 DIAGNOSIS — N764 Abscess of vulva: Secondary | ICD-10-CM | POA: Diagnosis present

## 2019-10-02 DIAGNOSIS — R059 Cough, unspecified: Secondary | ICD-10-CM

## 2019-10-02 DIAGNOSIS — L03317 Cellulitis of buttock: Secondary | ICD-10-CM | POA: Diagnosis present

## 2019-10-02 DIAGNOSIS — B3749 Other urogenital candidiasis: Secondary | ICD-10-CM | POA: Diagnosis present

## 2019-10-02 DIAGNOSIS — E860 Dehydration: Secondary | ICD-10-CM | POA: Diagnosis present

## 2019-10-02 DIAGNOSIS — Y92239 Unspecified place in hospital as the place of occurrence of the external cause: Secondary | ICD-10-CM | POA: Diagnosis not present

## 2019-10-02 DIAGNOSIS — E871 Hypo-osmolality and hyponatremia: Secondary | ICD-10-CM | POA: Diagnosis present

## 2019-10-02 DIAGNOSIS — Z20822 Contact with and (suspected) exposure to covid-19: Secondary | ICD-10-CM | POA: Diagnosis present

## 2019-10-02 DIAGNOSIS — N762 Acute vulvitis: Secondary | ICD-10-CM | POA: Diagnosis present

## 2019-10-02 DIAGNOSIS — E119 Type 2 diabetes mellitus without complications: Secondary | ICD-10-CM

## 2019-10-02 DIAGNOSIS — J45909 Unspecified asthma, uncomplicated: Secondary | ICD-10-CM

## 2019-10-02 DIAGNOSIS — Z7951 Long term (current) use of inhaled steroids: Secondary | ICD-10-CM

## 2019-10-02 DIAGNOSIS — E870 Hyperosmolality and hypernatremia: Secondary | ICD-10-CM | POA: Diagnosis present

## 2019-10-02 DIAGNOSIS — Z79899 Other long term (current) drug therapy: Secondary | ICD-10-CM

## 2019-10-02 DIAGNOSIS — L03311 Cellulitis of abdominal wall: Secondary | ICD-10-CM | POA: Diagnosis present

## 2019-10-02 DIAGNOSIS — I998 Other disorder of circulatory system: Secondary | ICD-10-CM | POA: Diagnosis present

## 2019-10-02 DIAGNOSIS — T4275XA Adverse effect of unspecified antiepileptic and sedative-hypnotic drugs, initial encounter: Secondary | ICD-10-CM | POA: Diagnosis not present

## 2019-10-02 DIAGNOSIS — R1310 Dysphagia, unspecified: Secondary | ICD-10-CM | POA: Diagnosis present

## 2019-10-02 DIAGNOSIS — E8729 Other acidosis: Secondary | ICD-10-CM

## 2019-10-02 DIAGNOSIS — J9601 Acute respiratory failure with hypoxia: Secondary | ICD-10-CM | POA: Diagnosis present

## 2019-10-02 DIAGNOSIS — D696 Thrombocytopenia, unspecified: Secondary | ICD-10-CM | POA: Diagnosis not present

## 2019-10-02 LAB — POCT I-STAT 7, (LYTES, BLD GAS, ICA,H+H)
Acid-base deficit: 5 mmol/L — ABNORMAL HIGH (ref 0.0–2.0)
Bicarbonate: 19.8 mmol/L — ABNORMAL LOW (ref 20.0–28.0)
Calcium, Ion: 1.38 mmol/L (ref 1.15–1.40)
HCT: 48 % — ABNORMAL HIGH (ref 36.0–46.0)
Hemoglobin: 16.3 g/dL — ABNORMAL HIGH (ref 12.0–15.0)
O2 Saturation: 96 %
Patient temperature: 97.8
Potassium: 4.5 mmol/L (ref 3.5–5.1)
Sodium: 157 mmol/L — ABNORMAL HIGH (ref 135–145)
TCO2: 21 mmol/L — ABNORMAL LOW (ref 22–32)
pCO2 arterial: 34.5 mmHg (ref 32.0–48.0)
pH, Arterial: 7.365 (ref 7.350–7.450)
pO2, Arterial: 84 mmHg (ref 83.0–108.0)

## 2019-10-02 LAB — CBC
HCT: 57.5 % — ABNORMAL HIGH (ref 36.0–46.0)
Hemoglobin: 18.2 g/dL — ABNORMAL HIGH (ref 12.0–15.0)
MCH: 30.4 pg (ref 26.0–34.0)
MCHC: 31.7 g/dL (ref 30.0–36.0)
MCV: 96 fL (ref 80.0–100.0)
Platelets: 321 10*3/uL (ref 150–400)
RBC: 5.99 MIL/uL — ABNORMAL HIGH (ref 3.87–5.11)
RDW: 13.4 % (ref 11.5–15.5)
WBC: 21 10*3/uL — ABNORMAL HIGH (ref 4.0–10.5)
nRBC: 0.1 % (ref 0.0–0.2)

## 2019-10-02 LAB — CBG MONITORING, ED: Glucose-Capillary: >600 mg/dL (ref 70–99)

## 2019-10-02 LAB — TROPONIN I (HIGH SENSITIVITY): Troponin I (High Sensitivity): 22 ng/L — ABNORMAL HIGH (ref <18)

## 2019-10-02 LAB — CK: Total CK: 19 U/L — ABNORMAL LOW (ref 38–234)

## 2019-10-02 LAB — AMMONIA: Ammonia: 32 umol/L (ref 9–35)

## 2019-10-02 LAB — LACTIC ACID, PLASMA: Lactic Acid, Venous: 2.4 mmol/L (ref 0.5–1.9)

## 2019-10-02 LAB — ETHANOL: Alcohol, Ethyl (B): <10 mg/dL (ref <10)

## 2019-10-02 MED ORDER — METRONIDAZOLE IN NACL 5-0.79 MG/ML-% IV SOLN
500.0000 mg | Freq: Once | INTRAVENOUS | Status: AC
  Administered 2019-10-02: 500 mg via INTRAVENOUS
  Filled 2019-10-02: qty 100

## 2019-10-02 MED ORDER — VANCOMYCIN HCL IN DEXTROSE 1-5 GM/200ML-% IV SOLN
1000.0000 mg | Freq: Once | INTRAVENOUS | Status: DC
  Filled 2019-10-02: qty 200

## 2019-10-02 MED ORDER — VANCOMYCIN HCL 2000 MG/400ML IV SOLN
2000.0000 mg | Freq: Once | INTRAVENOUS | Status: AC
  Administered 2019-10-02: 2000 mg via INTRAVENOUS
  Filled 2019-10-02: qty 400

## 2019-10-02 MED ORDER — LACTATED RINGERS IV BOLUS
1000.0000 mL | Freq: Once | INTRAVENOUS | Status: AC
  Administered 2019-10-03: 1000 mL via INTRAVENOUS

## 2019-10-02 MED ORDER — SODIUM CHLORIDE 0.9 % IV SOLN
2.0000 g | Freq: Once | INTRAVENOUS | Status: AC
  Administered 2019-10-02: 2 g via INTRAVENOUS
  Filled 2019-10-02: qty 2

## 2019-10-02 NOTE — ED Triage Notes (Signed)
Pt transported from home by EMS, pt found sitting in BR floor altered. Per family pt recently treated for an abscess on leg, tx with Cleocin,  CBG high, 500cc NS given. 02 86% on RA, 99 on 2L

## 2019-10-02 NOTE — ED Provider Notes (Signed)
[facility] EMERGENCY DEPARTMENT Provider Note   CSN: [id number] Arrival date & time: 10/02/19  2216     History Chief Complaint  Patient presents with  . Altered Mental Status    [name] is a 54 y.o. female with a past medical history of diabetes, chronic smoking, hyperlipidemia, asthma and depression brought in by EMS for altered mental status.  There is a level 5 caveat due to altered mental status.  Patient was recently seen 5 days ago for evaluation of left groin and buttock abscess which underwent I&D by Dr. Betsey Holiday.  She was treated with clindamycin and was asked to return in 2 days for packing removal, at that time she was recommended to have inpatient admission.  The patient was found in her bathroom today by her son.  I am unsure of the amount of time she was there but was found to be confused, tachypneic, hypoxic to 88%, tachycardic with blood sugars unreasonably high.  HPI     Past Medical History:  Diagnosis Date  . Allergy   . Anxiety   . Asthma   . Depression   . Diabetes mellitus without complication [facility])     Patient Active Problem List   Diagnosis Date Noted  . Type 2 diabetes mellitus without complication ([place]) 95/63/8756  . Elevated cholesterol with elevated triglycerides 04/03/2015  . Chronic bronchitis ([place]) 12/31/2014  . Smoking 01/22/2013  . Mood disorder ([place]) 06/12/2012  . Bronchospasm 06/12/2012    Past Surgical History:  Procedure Laterality Date  . CHOLECYSTECTOMY    . TUBAL LIGATION       OB History   No obstetric history on file.     Family History  Problem Relation Age of Onset  . Stroke Mother   . Heart disease Father     Social History   Tobacco Use  . Smoking status: Current Every Day Smoker    Packs/day: 1.00    Years: 34.00    Pack years: 34.00    Types: Cigarettes  . Smokeless tobacco: Never Used  Substance Use Topics  . Alcohol use: No    Alcohol/week: 0.0 standard drinks  . Drug use:  No    Home Medications Prior to Admission medications   Medication Sig Start Date End Date Taking? Authorizing Provider  albuterol (VENTOLIN HFA) 108 (90 Base) MCG/ACT inhaler INHALE TWO PUFFS BY MOUTH 4 TIMES DAILY AS NEEDED.  Pt prefers ventolin brand 09/26/16   Tenna Delaine D, PA-C  ALPRAZolam Duanne Moron) 1 MG tablet TAKE 1 TABLET BY MOUTH ONCE A DAY AND AT BEDTIME AS NEEDED FOR SLEEP 09/26/16   Tenna Delaine D, PA-C  atorvastatin (LIPITOR) 10 MG tablet Take 1 tablet (10 mg total) by mouth daily. 11/23/15   Leandrew Koyanagi, MD  beclomethasone (QVAR) 40 MCG/ACT inhaler Inhale 1 puff into the lungs 2 (two) times daily. 09/26/16   Tenna Delaine D, PA-C  blood glucose meter kit and supplies KIT Dispense based on patient and insurance preference. Use up to four times daily as directed. (FOR ICD-9 250.00, 250.01). 04/02/15   McVeigh, Sherren Mocha, PA  clindamycin (CLEOCIN) 150 MG capsule Take 2 capsules (300 mg total) by mouth 4 (four) times daily. 09/28/19   Pollina, Gwenyth Allegra, MD  HYDROcodone-homatropine (HYCODAN) 5-1.5 MG/5ML syrup Take 5 mLs by mouth every 8 (eight) hours as needed for cough. Please make sure this does not have guaifenesin in it please 12/08/15   Weber, Damaris Hippo, PA-C  lisinopril (PRINIVIL,ZESTRIL) 2.5  MG tablet Take 1 tablet (2.5 mg total) by mouth daily. 11/23/15   Leandrew Koyanagi, MD  loratadine (CLARITIN) 10 MG tablet Take 10 mg by mouth daily.    [provider]  metFORMIN (GLUCOPHAGE) 500 MG tablet Take 2 tablets (1,000 mg total) by mouth 2 (two) times daily with a meal. 11/23/15   Leandrew Koyanagi, MD  montelukast (SINGULAIR) 10 MG tablet Take 1 tablet (10 mg total) by mouth at bedtime. 12/31/14   Copland, Gay Filler, MD  sertraline (ZOLOFT) 100 MG tablet Take 1 tablet (100 mg total) by mouth daily. 09/26/16   Tenna Delaine D, PA-C    Allergies    Codeine and Guaifenesin  Review of Systems   Review of Systems  Unable to perform ROS: Mental status change      Physical Exam Updated Vital Signs BP 104/84   Pulse (!) 126   Temp 98.8 F (37.1 C)   Resp (!) 28   Wt 112 kg   SpO2 (!) 88% Comment: placed on 2L  BMI 39.85 kg/m   Physical Exam Vitals and nursing note reviewed. Exam conducted with a chaperone present.  Constitutional:      General: She is not in acute distress.    Appearance: She is well-developed. She is ill-appearing. She is not diaphoretic.  HENT:     Head: Normocephalic and atraumatic.  Eyes:     General: No scleral icterus.    Conjunctiva/sclera: Conjunctivae normal.  Cardiovascular:     Rate and Rhythm: Regular rhythm. Tachycardia present.     Heart sounds: Normal heart sounds. No murmur. No friction rub. No gallop.   Pulmonary:     Effort: Pulmonary effort is normal. Tachypnea present. No respiratory distress.     Breath sounds: Normal breath sounds.  Abdominal:     General: Bowel sounds are normal. There is no distension.     Palpations: Abdomen is soft. There is no mass.     Tenderness: There is abdominal tenderness in the right lower quadrant, suprapubic area and left lower quadrant. There is no guarding.    Genitourinary:    Exam position: Supine.    Musculoskeletal:     Cervical back: Normal range of motion.  Skin:    General: Skin is warm and dry.  Neurological:     GCS: GCS eye subscore is 3. GCS verbal subscore is 2. GCS motor subscore is 5.  Psychiatric:        Behavior: Behavior normal.     ED Results / Procedures / Treatments   Labs (all labs ordered are listed, but only abnormal results are displayed) Labs Reviewed  CBG MONITORING, ED - Abnormal; Notable for the following components:      Result Value   Glucose-Capillary >600 (*)    All other components within normal limits  BASIC METABOLIC PANEL  CBC  URINALYSIS, ROUTINE W REFLEX MICROSCOPIC  LACTIC ACID, PLASMA  LACTIC ACID, PLASMA  CBG MONITORING, ED  I-STAT VENOUS BLOOD GAS, ED    EKG EKG  Interpretation  Date/Time:  Monday October 02 2019 22:22:56 EST Ventricular Rate:  125 PR Interval:    QRS Duration: 88 QT Interval:  334 QTC Calculation: 482 R Axis:   81 Text Interpretation: Sinus tachycardia Borderline repolarization abnormality No STMEI Confirmed by Octaviano Glow [phone]) on 10/02/2019 10:24:55 PM   Radiology No results found.  Procedures .Critical Care Performed by: Margarita Mail, PA-C Authorized by: Margarita Mail, PA-C   Critical care provider statement:  Critical care time (minutes):  45   Critical care time was exclusive of:  Separately billable procedures and treating other patients   Critical care was necessary to treat or prevent imminent or life-threatening deterioration of the following conditions:  Metabolic crisis and CNS failure or compromise   Critical care was time spent personally by me on the following activities:  Discussions with consultants, evaluation of patient's response to treatment, examination of patient, ordering and performing treatments and interventions, ordering and review of laboratory studies, ordering and review of radiographic studies, pulse oximetry, re-evaluation of patient's condition, obtaining history from patient or surrogate and review of old charts   (including critical care time)  Medications Ordered in ED Medications - No data to display  ED Course  I have reviewed the triage vital signs and the nursing notes.  Pertinent labs & imaging results that were available during my care of the patient were reviewed by me and considered in my medical decision making (see chart for details).  Clinical Course as of Oct 02 106  Mon Oct 02, 2019  1257 Spoke with Dr. Joelyn Oms about CT abdomen pelvis findings concerning for air levels secondary to recent procedure versus necrotizing disease process   [AH]  Tue Oct 03, 2019  0107 I spoke with Dr. Kieth Brightly who will see the patient at bedside.   [AH]    Clinical Course  User Index [AH] Margarita Mail, PA-C   MDM Rules/Calculators/A&P                      JO:ITGPQDI mental status VS: BP 112/78   Pulse (!) 116   Temp 98.8 F (37.1 C)   Resp (!) 24   Wt 112 kg   SpO2 95%   BMI 39.85 kg/m  YM:EBRAXEN is gathered by EMR and EMS, Son at bedside. DDX:The differential diagnosis for AMS is extensive and includes, but is not limited to: drug overdose - opioids, alcohol, sedatives, antipsychotics, drug withdrawal, others; Metabolic: hypoxia, hypoglycemia, hyperglycemia, hypercalcemia, hypernatremia, hyponatremia, uremia, hepatic encephalopathy, hypothyroidism, hyperthyroidism, vitamin B12 or thiamine deficiency, carbon monoxide poisoning, Wilson's disease, Lactic acidosis, DKA/HHOS; Infectious: meningitis, encephalitis, bacteremia/sepsis, urinary tract infection, pneumonia, neurosyphilis; Structural: Space-occupying lesion, (brain tumor, subdural hematoma, hydrocephalus,); Vascular: stroke, subarachnoid hemorrhage, coronary ischemia, hypertensive encephalopathy, CNS vasculitis, thrombotic thrombocytopenic purpura, disseminated intravascular coagulation, hyperviscosity; Psychiatric: Schizophrenia, depression; Other: Seizure, hypothermia, heat stroke, ICU psychosis, dementia -"sundowning."  Labs: I reviewed the labs which show elevated blood glucose of >900, blood cell count of 21,000, elevated hemoglobin likely volume contraction lactic acidosis of 2.4.  Sodium of 161, bicarb of 18.  Patient has acute kidney injury with a serum creatinine of 2.08 up from 0.82, 6 days ago CK within normal limits.  Troponin mildly elevated but not of concern for ACS. Imaging: I personally reviewed the images (CT abdomen and pelvis without contrast) which show(s) gas in the right labia with extension of infection into the abdominal wall and rectus muscle EKG sinus tachycardia at a rate of 125 MDM: Patient here with DKA versus HHS, altered mental status, acute kidney injury, hypernatremia  likely stemming from abdominal pelvic infection.  I have asked Dr. Kieth Brightly to consult on the patient to rule out necrotizing infection.  Patient treated with IV insulin, broad-spectrum antibiotics, fluid resuscitation and will be admitted to the stepdown unit by Dr. Linda Hedges. Patient disposition:admit Patient condition: serious. The patient appears reasonably stabilized for admission considering the current resources, flow, and capabilities available in the ED at this  time, and I doubt any other [facility] requiring further screening and/or treatment in the ED prior to admission.  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

## 2019-10-03 ENCOUNTER — Emergency Department (HOSPITAL_COMMUNITY): Payer: Self-pay

## 2019-10-03 ENCOUNTER — Inpatient Hospital Stay (HOSPITAL_COMMUNITY): Payer: Self-pay

## 2019-10-03 DIAGNOSIS — E872 Acidosis: Secondary | ICD-10-CM | POA: Insufficient documentation

## 2019-10-03 DIAGNOSIS — F39 Unspecified mood [affective] disorder: Secondary | ICD-10-CM

## 2019-10-03 DIAGNOSIS — R6520 Severe sepsis without septic shock: Secondary | ICD-10-CM

## 2019-10-03 DIAGNOSIS — A419 Sepsis, unspecified organism: Secondary | ICD-10-CM

## 2019-10-03 DIAGNOSIS — R410 Disorientation, unspecified: Secondary | ICD-10-CM | POA: Diagnosis not present

## 2019-10-03 DIAGNOSIS — E1165 Type 2 diabetes mellitus with hyperglycemia: Secondary | ICD-10-CM

## 2019-10-03 DIAGNOSIS — E8729 Other acidosis: Secondary | ICD-10-CM | POA: Insufficient documentation

## 2019-10-03 DIAGNOSIS — N179 Acute kidney failure, unspecified: Secondary | ICD-10-CM | POA: Insufficient documentation

## 2019-10-03 DIAGNOSIS — N340 Urethral abscess: Secondary | ICD-10-CM

## 2019-10-03 DIAGNOSIS — E870 Hyperosmolality and hypernatremia: Secondary | ICD-10-CM | POA: Diagnosis present

## 2019-10-03 DIAGNOSIS — E1100 Type 2 diabetes mellitus with hyperosmolarity without nonketotic hyperglycemic-hyperosmolar coma (NKHHC): Secondary | ICD-10-CM

## 2019-10-03 LAB — CBC WITH DIFFERENTIAL/PLATELET
Abs Immature Granulocytes: 0.67 10*3/uL — ABNORMAL HIGH (ref 0.00–0.07)
Basophils Absolute: 0 10*3/uL (ref 0.0–0.1)
Basophils Relative: 0 %
Eosinophils Absolute: 0 10*3/uL (ref 0.0–0.5)
Eosinophils Relative: 0 %
HCT: 52.2 % — ABNORMAL HIGH (ref 36.0–46.0)
Hemoglobin: 16.4 g/dL — ABNORMAL HIGH (ref 12.0–15.0)
Immature Granulocytes: 3 %
Lymphocytes Relative: 12 %
Lymphs Abs: 2.6 10*3/uL (ref 0.7–4.0)
MCH: 30 pg (ref 26.0–34.0)
MCHC: 31.4 g/dL (ref 30.0–36.0)
MCV: 95.6 fL (ref 80.0–100.0)
Monocytes Absolute: 2 10*3/uL — ABNORMAL HIGH (ref 0.1–1.0)
Monocytes Relative: 10 %
Neutro Abs: 16 10*3/uL — ABNORMAL HIGH (ref 1.7–7.7)
Neutrophils Relative %: 75 %
Platelets: 269 10*3/uL (ref 150–400)
RBC: 5.46 MIL/uL — ABNORMAL HIGH (ref 3.87–5.11)
RDW: 13.5 % (ref 11.5–15.5)
WBC: 21.4 10*3/uL — ABNORMAL HIGH (ref 4.0–10.5)
nRBC: 0.1 % (ref 0.0–0.2)

## 2019-10-03 LAB — BASIC METABOLIC PANEL
Anion gap: 23 — ABNORMAL HIGH (ref 5–15)
Anion gap: 20 — ABNORMAL HIGH (ref 5–15)
Anion gap: 14 (ref 5–15)
Anion gap: 13 (ref 5–15)
BUN: 48 mg/dL — ABNORMAL HIGH (ref 6–20)
BUN: 44 mg/dL — ABNORMAL HIGH (ref 6–20)
BUN: 42 mg/dL — ABNORMAL HIGH (ref 6–20)
BUN: 35 mg/dL — ABNORMAL HIGH (ref 6–20)
BUN: 33 mg/dL — ABNORMAL HIGH (ref 6–20)
CO2: 24 mmol/L (ref 22–32)
CO2: 27 mmol/L (ref 22–32)
CO2: 27 mmol/L (ref 22–32)
CO2: 27 mmol/L (ref 22–32)
CO2: 27 mmol/L (ref 22–32)
Calcium: 9.5 mg/dL (ref 8.9–10.3)
Calcium: 9.6 mg/dL (ref 8.9–10.3)
Calcium: 9.4 mg/dL (ref 8.9–10.3)
Calcium: 9.2 mg/dL (ref 8.9–10.3)
Calcium: 8.8 mg/dL — ABNORMAL LOW (ref 8.9–10.3)
Chloride: 123 mmol/L — ABNORMAL HIGH (ref 98–111)
Chloride: 124 mmol/L — ABNORMAL HIGH (ref 98–111)
Chloride: >130 mmol/L (ref 98–111)
Chloride: 128 mmol/L — ABNORMAL HIGH (ref 98–111)
Chloride: 127 mmol/L — ABNORMAL HIGH (ref 98–111)
Creatinine, Ser: 1.68 mg/dL — ABNORMAL HIGH (ref 0.44–1.00)
Creatinine, Ser: 1.26 mg/dL — ABNORMAL HIGH (ref 0.44–1.00)
Creatinine, Ser: 1.23 mg/dL — ABNORMAL HIGH (ref 0.44–1.00)
Creatinine, Ser: 1.01 mg/dL — ABNORMAL HIGH (ref 0.44–1.00)
Creatinine, Ser: 1.16 mg/dL — ABNORMAL HIGH (ref 0.44–1.00)
GFR calc Af Amer: 40 mL/min — ABNORMAL LOW (ref >60)
GFR calc Af Amer: 56 mL/min — ABNORMAL LOW (ref >60)
GFR calc Af Amer: 58 mL/min — ABNORMAL LOW (ref >60)
GFR calc Af Amer: >60 mL/min (ref >60)
GFR calc Af Amer: >60 mL/min (ref >60)
GFR calc non Af Amer: 34 mL/min — ABNORMAL LOW (ref >60)
GFR calc non Af Amer: 48 mL/min — ABNORMAL LOW (ref >60)
GFR calc non Af Amer: 50 mL/min — ABNORMAL LOW (ref >60)
GFR calc non Af Amer: >60 mL/min (ref >60)
GFR calc non Af Amer: 53 mL/min — ABNORMAL LOW (ref >60)
Glucose, Bld: 569 mg/dL (ref 70–99)
Glucose, Bld: 402 mg/dL — ABNORMAL HIGH (ref 70–99)
Glucose, Bld: 258 mg/dL — ABNORMAL HIGH (ref 70–99)
Glucose, Bld: 203 mg/dL — ABNORMAL HIGH (ref 70–99)
Glucose, Bld: 200 mg/dL — ABNORMAL HIGH (ref 70–99)
Potassium: 4.3 mmol/L (ref 3.5–5.1)
Potassium: 3.7 mmol/L (ref 3.5–5.1)
Potassium: 3.8 mmol/L (ref 3.5–5.1)
Potassium: 3.3 mmol/L — ABNORMAL LOW (ref 3.5–5.1)
Potassium: 3.5 mmol/L (ref 3.5–5.1)
Sodium: 170 mmol/L (ref 135–145)
Sodium: 171 mmol/L (ref 135–145)
Sodium: 172 mmol/L (ref 135–145)
Sodium: 169 mmol/L (ref 135–145)
Sodium: 167 mmol/L (ref 135–145)

## 2019-10-03 LAB — GLUCOSE, CAPILLARY
Glucose-Capillary: 164 mg/dL — ABNORMAL HIGH (ref 70–99)
Glucose-Capillary: 165 mg/dL — ABNORMAL HIGH (ref 70–99)
Glucose-Capillary: 171 mg/dL — ABNORMAL HIGH (ref 70–99)
Glucose-Capillary: 188 mg/dL — ABNORMAL HIGH (ref 70–99)
Glucose-Capillary: 189 mg/dL — ABNORMAL HIGH (ref 70–99)
Glucose-Capillary: 199 mg/dL — ABNORMAL HIGH (ref 70–99)
Glucose-Capillary: 205 mg/dL — ABNORMAL HIGH (ref 70–99)
Glucose-Capillary: 210 mg/dL — ABNORMAL HIGH (ref 70–99)
Glucose-Capillary: 210 mg/dL — ABNORMAL HIGH (ref 70–99)

## 2019-10-03 LAB — POCT I-STAT 7, (LYTES, BLD GAS, ICA,H+H)
Acid-Base Excess: 5 mmol/L — ABNORMAL HIGH (ref 0.0–2.0)
Bicarbonate: 31 mmol/L — ABNORMAL HIGH (ref 20.0–28.0)
Calcium, Ion: 1.3 mmol/L (ref 1.15–1.40)
HCT: 56 % — ABNORMAL HIGH (ref 36.0–46.0)
Hemoglobin: 19 g/dL — ABNORMAL HIGH (ref 12.0–15.0)
O2 Saturation: 92 %
Patient temperature: 98.8
Potassium: 3.3 mmol/L — ABNORMAL LOW (ref 3.5–5.1)
Sodium: 173 mmol/L (ref 135–145)
TCO2: 32 mmol/L (ref 22–32)
pCO2 arterial: 47.6 mmHg (ref 32.0–48.0)
pH, Arterial: 7.422 (ref 7.350–7.450)
pO2, Arterial: 63 mmHg — ABNORMAL LOW (ref 83.0–108.0)

## 2019-10-03 LAB — CBC
HCT: 52.1 % — ABNORMAL HIGH (ref 36.0–46.0)
Hemoglobin: 16.4 g/dL — ABNORMAL HIGH (ref 12.0–15.0)
MCH: 30.1 pg (ref 26.0–34.0)
MCHC: 31.5 g/dL (ref 30.0–36.0)
MCV: 95.8 fL (ref 80.0–100.0)
Platelets: 267 10*3/uL (ref 150–400)
RBC: 5.44 MIL/uL — ABNORMAL HIGH (ref 3.87–5.11)
RDW: 13.6 % (ref 11.5–15.5)
WBC: 22.8 10*3/uL — ABNORMAL HIGH (ref 4.0–10.5)
nRBC: 0.1 % (ref 0.0–0.2)

## 2019-10-03 LAB — HIV ANTIBODY (ROUTINE TESTING W REFLEX): HIV Screen 4th Generation wRfx: NONREACTIVE

## 2019-10-03 LAB — CBG MONITORING, ED
Glucose-Capillary: >600 mg/dL (ref 70–99)
Glucose-Capillary: 560 mg/dL (ref 70–99)
Glucose-Capillary: 522 mg/dL (ref 70–99)
Glucose-Capillary: 465 mg/dL — ABNORMAL HIGH (ref 70–99)
Glucose-Capillary: 425 mg/dL — ABNORMAL HIGH (ref 70–99)
Glucose-Capillary: 331 mg/dL — ABNORMAL HIGH (ref 70–99)
Glucose-Capillary: 268 mg/dL — ABNORMAL HIGH (ref 70–99)
Glucose-Capillary: 246 mg/dL — ABNORMAL HIGH (ref 70–99)
Glucose-Capillary: 271 mg/dL — ABNORMAL HIGH (ref 70–99)
Glucose-Capillary: 250 mg/dL — ABNORMAL HIGH (ref 70–99)
Glucose-Capillary: 238 mg/dL — ABNORMAL HIGH (ref 70–99)

## 2019-10-03 LAB — RESPIRATORY PANEL BY RT PCR (FLU A&B, COVID)
Influenza A by PCR: NEGATIVE
Influenza B by PCR: NEGATIVE
SARS Coronavirus 2 by RT PCR: NEGATIVE

## 2019-10-03 LAB — URINALYSIS, ROUTINE W REFLEX MICROSCOPIC
Bacteria, UA: NONE SEEN
Bilirubin Urine: NEGATIVE
Glucose, UA: >=500 mg/dL — AB
Ketones, ur: 80 mg/dL — AB
Nitrite: NEGATIVE
Protein, ur: NEGATIVE mg/dL
Specific Gravity, Urine: 1.024 (ref 1.005–1.030)
pH: 5 (ref 5.0–8.0)

## 2019-10-03 LAB — MRSA PCR SCREENING: MRSA by PCR: NEGATIVE

## 2019-10-03 LAB — CULTURE, BLOOD (ROUTINE X 2)
Culture: NO GROWTH
Culture: NO GROWTH
Special Requests: ADEQUATE
Special Requests: ADEQUATE

## 2019-10-03 LAB — BETA-HYDROXYBUTYRIC ACID: Beta-Hydroxybutyric Acid: 1.17 mmol/L — ABNORMAL HIGH (ref 0.05–0.27)

## 2019-10-03 LAB — LACTIC ACID, PLASMA: Lactic Acid, Venous: 2.5 mmol/L (ref 0.5–1.9)

## 2019-10-03 LAB — CORTISOL-AM, BLOOD: Cortisol - AM: 54.9 ug/dL — ABNORMAL HIGH (ref 6.7–22.6)

## 2019-10-03 LAB — PROCALCITONIN: Procalcitonin: 0.89 ng/mL

## 2019-10-03 LAB — TROPONIN I (HIGH SENSITIVITY): Troponin I (High Sensitivity): 24 ng/L — ABNORMAL HIGH (ref <18)

## 2019-10-03 LAB — OSMOLALITY: Osmolality: 398 mOsm/kg (ref 275–295)

## 2019-10-03 MED ORDER — HALOPERIDOL LACTATE 5 MG/ML IJ SOLN
5.0000 mg | Freq: Four times a day (QID) | INTRAMUSCULAR | Status: DC | PRN
  Administered 2019-10-03: 5 mg via INTRAVENOUS
  Filled 2019-10-03: qty 1

## 2019-10-03 MED ORDER — SODIUM CHLORIDE 0.9 % IV SOLN: INTRAVENOUS | Status: DC

## 2019-10-03 MED ORDER — POTASSIUM CHLORIDE 20 MEQ/15ML (10%) PO SOLN
40.0000 meq | Freq: Once | ORAL | Status: AC
  Administered 2019-10-03: 40 meq
  Filled 2019-10-03: qty 30

## 2019-10-03 MED ORDER — INSULIN DETEMIR 100 UNIT/ML ~~LOC~~ SOLN
20.0000 [IU] | Freq: Two times a day (BID) | SUBCUTANEOUS | Status: DC
  Administered 2019-10-03: 20 [IU] via SUBCUTANEOUS
  Filled 2019-10-03 (×3): qty 0.2

## 2019-10-03 MED ORDER — HALOPERIDOL LACTATE 5 MG/ML IJ SOLN
5.0000 mg | Freq: Once | INTRAMUSCULAR | Status: AC
  Administered 2019-10-03: 5 mg via INTRAVENOUS
  Filled 2019-10-03: qty 1

## 2019-10-03 MED ORDER — DEXTROSE 50 % IV SOLN: 0.0000 mL | INTRAVENOUS | Status: DC | PRN

## 2019-10-03 MED ORDER — LORATADINE 10 MG PO TABS: 10.0000 mg | ORAL_TABLET | Freq: Every day | ORAL | Status: DC

## 2019-10-03 MED ORDER — ENOXAPARIN SODIUM 40 MG/0.4ML ~~LOC~~ SOLN
40.0000 mg | SUBCUTANEOUS | Status: DC
  Administered 2019-10-03: 40 mg via SUBCUTANEOUS
  Filled 2019-10-03: qty 0.4

## 2019-10-03 MED ORDER — ACETAMINOPHEN 650 MG RE SUPP
650.0000 mg | Freq: Four times a day (QID) | RECTAL | Status: DC | PRN
  Administered 2019-10-03: 650 mg via RECTAL
  Filled 2019-10-03: qty 1

## 2019-10-03 MED ORDER — INSULIN ASPART 100 UNIT/ML ~~LOC~~ SOLN
3.0000 [IU] | SUBCUTANEOUS | Status: DC
  Administered 2019-10-04 (×2): 6 [IU] via SUBCUTANEOUS
  Administered 2019-10-04: 9 [IU] via SUBCUTANEOUS

## 2019-10-03 MED ORDER — LORAZEPAM 2 MG/ML IJ SOLN
0.5000 mg | INTRAMUSCULAR | Status: DC | PRN
  Administered 2019-10-03: 0.5 mg via INTRAVENOUS

## 2019-10-03 MED ORDER — SODIUM CHLORIDE 0.45 % IV SOLN: INTRAVENOUS | Status: DC

## 2019-10-03 MED ORDER — SODIUM CHLORIDE 0.9 % IV SOLN
2.0000 g | INTRAVENOUS | Status: AC
  Administered 2019-10-03 – 2019-10-11 (×9): 2 g via INTRAVENOUS
  Filled 2019-10-03: qty 20
  Filled 2019-10-03: qty 2
  Filled 2019-10-03 (×3): qty 20
  Filled 2019-10-03: qty 2
  Filled 2019-10-03: qty 20
  Filled 2019-10-03: qty 2
  Filled 2019-10-03: qty 20

## 2019-10-03 MED ORDER — FREE WATER: 500.0000 mL | Status: DC

## 2019-10-03 MED ORDER — ATORVASTATIN CALCIUM 10 MG PO TABS: 10.0000 mg | ORAL_TABLET | Freq: Every day | ORAL | Status: DC

## 2019-10-03 MED ORDER — POTASSIUM CHLORIDE 10 MEQ/100ML IV SOLN
10.0000 meq | INTRAVENOUS | Status: AC
  Administered 2019-10-03: 10 meq via INTRAVENOUS
  Filled 2019-10-03: qty 100

## 2019-10-03 MED ORDER — FREE WATER: 400.0000 mL | Status: DC

## 2019-10-03 MED ORDER — VANCOMYCIN HCL IN DEXTROSE 1-5 GM/200ML-% IV SOLN
1000.0000 mg | INTRAVENOUS | Status: DC
  Administered 2019-10-03 – 2019-10-05 (×3): 1000 mg via INTRAVENOUS
  Filled 2019-10-03 (×4): qty 200

## 2019-10-03 MED ORDER — LACTATED RINGERS IV BOLUS: 1000.0000 mL | Freq: Once | INTRAVENOUS | Status: DC

## 2019-10-03 MED ORDER — FLUCONAZOLE 40 MG/ML PO SUSR
150.0000 mg | Freq: Once | ORAL | Status: AC
  Administered 2019-10-03: 152 mg
  Filled 2019-10-03: qty 3.8

## 2019-10-03 MED ORDER — INSULIN REGULAR(HUMAN) IN NACL 100-0.9 UT/100ML-% IV SOLN
INTRAVENOUS | Status: DC
  Administered 2019-10-03: 5 [IU]/h via INTRAVENOUS
  Administered 2019-10-03: 4.6 [IU]/h via INTRAVENOUS
  Administered 2019-10-03: 6 [IU]/h via INTRAVENOUS
  Filled 2019-10-03 (×2): qty 100

## 2019-10-03 MED ORDER — FREE WATER
300.0000 mL | Status: DC
  Administered 2019-10-03 – 2019-10-04 (×4): 300 mL

## 2019-10-03 MED ORDER — SODIUM CHLORIDE 0.9 % IV BOLUS
20.0000 mL/kg | Freq: Once | INTRAVENOUS | Status: AC
  Administered 2019-10-03: 2240 mL via INTRAVENOUS

## 2019-10-03 MED ORDER — LORAZEPAM 2 MG/ML IJ SOLN
INTRAMUSCULAR | Status: AC
  Filled 2019-10-03: qty 1

## 2019-10-03 MED ORDER — DEXTROSE-NACL 5-0.45 % IV SOLN: INTRAVENOUS | Status: DC

## 2019-10-03 MED ORDER — VANCOMYCIN HCL IN DEXTROSE 1-5 GM/200ML-% IV SOLN: 1000.0000 mg | Freq: Once | INTRAVENOUS | Status: DC

## 2019-10-03 MED ORDER — CHLORHEXIDINE GLUCONATE CLOTH 2 % EX PADS
6.0000 | MEDICATED_PAD | Freq: Every day | CUTANEOUS | Status: DC
  Administered 2019-10-03 – 2019-10-12 (×12): 6 via TOPICAL

## 2019-10-03 MED ORDER — POTASSIUM CHLORIDE 10 MEQ/100ML IV SOLN
INTRAVENOUS | Status: AC
  Administered 2019-10-03: 10 meq
  Filled 2019-10-03: qty 100

## 2019-10-03 NOTE — ED Notes (Signed)
Pt to CT via stretcher

## 2019-10-03 NOTE — Progress Notes (Signed)
eLink Physician-Brief Progress Note Patient Name: [name] DOB: 08-21-1965 MRN: [id number]   Date of Service  10/03/2019  HPI/Events of Note  39F admitted with sepsis/abscess/DKA. Pt on insulin drip . Last 3 BS 171-188. RN called , Endo tool indicates okay to transition to Phase 3 . No tube feeds. Cortrak in place .   eICU Interventions  Transition to Phase 3 ICU hyperglycemia protocol. Orders placed.      Intervention Category Intermediate Interventions: Hyperglycemia - evaluation and treatment  [name] 10/03/2019, 8:53 PM

## 2019-10-03 NOTE — H&P (Signed)
History and Physical    [name] PRF:[id number] DOB: November 12, 1965 DOA: 10/02/2019  PCP: Patient, No Pcp Per (Confirm with patient/family/NH records and if not entered, this has to be entered at [facility] point of entry) Patient coming from: home  I have personally briefly reviewed patient's old medical records in [place]  Chief Complaint: found down  HPI: [name] is a 54 y.o. female with medical history significant of poorly controlled diabetes was recently seen at [facility] for perineal abscess which was incised and drained of purulent material on Feb 10 and packed with iodoform gauze. Patient was offered hospitalization for treatment but declined. She did return for dressing change and followup. Her son found her 10/02/19 down for an unspecified period of time. She was brought to [facility] for further evaluation (For level 3, the HPI must include 4+ descriptors: Location, Quality, Severity, Duration, Timing, Context, modifying factors, associated signs/symptoms and/or status of 3+ chronic problems.)  (Please avoid self-populating past medical history here) (The initial 2-3 lines should be focused and good to copy and paste in the HPI section of the daily progress note).  ED Course: In the ED patient was afebrile with borderline low BP. Lab revealed acute renal insufficiency, marked leukocytosis, serum glucose of 973, Na 161. She had erythema of the abdominal wall, intertriginous erythematous macular rash both groins, white adherent discharge to the vulvar and perineal region. Code sepsis called: patient given 1L LR and then 1 L NS, Abx promptly administered including Vancomycin, cefipime and Flagyl. glucose controller protocol was intiiated. GS was called to see the patient in consult after CT revealed free air in the labia and perineal areas. TRH called to admit patient for management of Nonketotitc hyperosmolar hyperglycemia and sepsis.  Review of Systems: As per HPI otherwise 10 point  review of systems negative. Caveat - poor historian 2/2 delirium with agitation Unacceptable ROS statements: "10 systems reviewed," "Extensive" (without elaboration).  Acceptable ROS statements: "All others negative," "All others reviewed and are negative," and "All others unremarkable," with at [place] documented Can't double dip - if using for HPI can't use for ROS  Past Medical History:  Diagnosis Date  . Allergy   . Anxiety   . Asthma   . Depression   . Diabetes mellitus without complication [facility])     Past Surgical History:  Procedure Laterality Date  . CHOLECYSTECTOMY    . TUBAL LIGATION     Soc Hx -  Unable to obtain 2/2 delirium   reports that she has been smoking cigarettes. She has a 34.00 pack-year smoking history. She has never used smokeless tobacco. She reports that she does not drink alcohol or use drugs.  Allergies  Allergen Reactions  . Codeine   . Guaifenesin Hives    Family History  Problem Relation Age of Onset  . Stroke Mother   . Heart disease Father    Unacceptable: Noncontributory, unremarkable, or negative. Acceptable: Family history reviewed and not pertinent (If you reviewed it)  Prior to Admission medications   Medication Sig Start Date End Date Taking? Authorizing Provider  clindamycin (CLEOCIN) 150 MG capsule Take 2 capsules (300 mg total) by mouth 4 (four) times daily. 09/28/19  Yes Pollina, Gwenyth Allegra, MD  EPINEPHrine (PRIMATENE MIST) 0.125 MG/ACT AERO Inhale 2 puffs into the lungs every 4 (four) hours as needed (BREATHING ISSUES).   Yes [provider]  albuterol (VENTOLIN HFA) 108 (90 Base) MCG/ACT inhaler INHALE TWO PUFFS BY MOUTH  4 TIMES DAILY AS NEEDED.  Pt prefers ventolin brand Patient not taking: Reported on 10/02/2019 09/26/16   Tenna Delaine D, PA-C  atorvastatin (LIPITOR) 10 MG tablet Take 1 tablet (10 mg total) by mouth daily. Patient not taking: Reported on 10/02/2019 11/23/15   Leandrew Koyanagi, MD   beclomethasone (QVAR) 40 MCG/ACT inhaler Inhale 1 puff into the lungs 2 (two) times daily. Patient not taking: Reported on 10/02/2019 09/26/16   Tenna Delaine D, PA-C  blood glucose meter kit and supplies KIT Dispense based on patient and insurance preference. Use up to four times daily as directed. (FOR ICD-9 250.00, 250.01). 04/02/15   McVeigh, Sherren Mocha, PA  HYDROcodone-homatropine (HYCODAN) 5-1.5 MG/5ML syrup Take 5 mLs by mouth every 8 (eight) hours as needed for cough. Please make sure this does not have guaifenesin in it please Patient not taking: Reported on 10/02/2019 12/08/15   Gale Journey, Damaris Hippo, PA-C  lisinopril (PRINIVIL,ZESTRIL) 2.5 MG tablet Take 1 tablet (2.5 mg total) by mouth daily. Patient not taking: Reported on 10/02/2019 11/23/15   Leandrew Koyanagi, MD  loratadine (CLARITIN) 10 MG tablet Take 10 mg by mouth daily.    [provider]  metFORMIN (GLUCOPHAGE) 500 MG tablet Take 2 tablets (1,000 mg total) by mouth 2 (two) times daily with a meal. Patient not taking: Reported on 10/02/2019 11/23/15   Leandrew Koyanagi, MD  montelukast (SINGULAIR) 10 MG tablet Take 1 tablet (10 mg total) by mouth at bedtime. Patient not taking: Reported on 10/02/2019 12/31/14   Darreld Mclean, MD    Physical Exam: Vitals:   10/03/19 0000 10/03/19 0130 10/03/19 0411 10/03/19 0430  BP:  112/78  109/67  Pulse: (!) 120 (!) 116 (!) 121 (!) 123  Resp: (!) 30 (!) 24 (!) 25   Temp:      SpO2: 94% 95% 92% 93%  Weight:        Constitutional: NAD, calm, comfortable Vitals:   10/03/19 0000 10/03/19 0130 10/03/19 0411 10/03/19 0430  BP:  112/78  109/67  Pulse: (!) 120 (!) 116 (!) 121 (!) 123  Resp: (!) 30 (!) 24 (!) 25   Temp:      SpO2: 94% 95% 92% 93%  Weight:       General: obese, agitated woman who appear uncomfortable and is sensitive to exam. Eyes: PERRL, lids with marked erythema,  conjunctivae injected ENMT: Mucous membranes are dry. Posterior pharynx clear of any exudate or lesions.  Edentulous Neck: normal, supple, no masses, no thyromegaly Respiratory:  auscultation bilaterally with scattered rhonchi, no wheezing.. Normal respiratory effort. No accessory muscle use.  Cardiovascular: Regular rate and rhythm, no murmurs / rubs / gallops. No extremity edema. 1+ pedal pulses.   Abdomen: massively obese  Diffuse non-focal tenderness, no masses palpated but exam hindered by girth.  Bowel sounds positive.  Musculoskeletal: no clubbing / cyanosis. No joint deformity upper and lower extremities. Good ROM, no contractures. Normal muscle tone.  Skin: right lower abdominal wall erythematous, bruising on upper thigh and back, adherent white plaque vulvar region extending toward perineum, small abscess opening w/o drainage Neurologic: CN 2-12 grossly intact.  Strength 5/5 in all 4.  Psychiatric: Agitated patient, not oriented to place and lacking insight to medical condition.  (Anything < 9 systems with 2 bullets each down codes to level 1) (If patient refuses exam can't bill higher level) (Make sure to document decubitus ulcers present on admission -- if possible -- and whether patient has chronic indwelling catheter at  time of admission)  Labs on Admission: I have personally reviewed following labs and imaging studies  CBC: Recent Labs  Lab 09/27/19 2339 10/02/19 2241 10/02/19 2253 10/03/19 0405  WBC 18.4* 21.0*  --  PENDING  NEUTROABS 15.9*  --   --  PENDING  HGB 16.8* 18.2* 16.3* 16.4*  HCT 49.8* 57.5* 48.0* 52.2*  MCV 90.9 96.0  --  95.6  PLT 143* 321  --  678   Basic Metabolic Panel: Recent Labs  Lab 09/27/19 2339 10/02/19 2241 10/02/19 2253  NA 134* 161* 157*  K 3.8 4.9 4.5  CL 100 109  --   CO2 19* 18*  --   GLUCOSE 433* 973*  --   BUN 15 53*  --   CREATININE 0.82 2.08*  --   CALCIUM 9.0 10.7*  --    GFR: Estimated Creatinine Clearance: 39.2 mL/min (A) (by C-G formula based on SCr of 2.08 mg/dL (H)). Liver Function Tests: Recent Labs  Lab  10/02/19 2241  AST 17  ALT 24  ALKPHOS 176*  BILITOT 2.9*  PROT 7.8  ALBUMIN 2.8*   No results for input(s): LIPASE, AMYLASE in the last 168 hours. Recent Labs  Lab 10/02/19 2241  AMMONIA 32   Coagulation Profile: No results for input(s): INR, PROTIME in the last 168 hours. Cardiac Enzymes: Recent Labs  Lab 10/02/19 2241  CKTOTAL 19*   BNP (last 3 results) No results for input(s): PROBNP in the last 8760 hours. HbA1C: No results for input(s): HGBA1C in the last 72 hours. CBG: Recent Labs  Lab 10/02/19 2218 10/03/19 0114 10/03/19 0250 10/03/19 0405  GLUCAP >600* >600* 560* 522*   Lipid Profile: No results for input(s): CHOL, HDL, LDLCALC, TRIG, CHOLHDL, LDLDIRECT in the last 72 hours. Thyroid Function Tests: No results for input(s): TSH, T4TOTAL, FREET4, T3FREE, THYROIDAB in the last 72 hours. Anemia Panel: No results for input(s): VITAMINB12, FOLATE, FERRITIN, TIBC, IRON, RETICCTPCT in the last 72 hours. Urine analysis:    Component Value Date/Time   COLORURINE YELLOW 10/03/2019 0139   APPEARANCEUR HAZY (A) 10/03/2019 0139   LABSPEC 1.024 10/03/2019 0139   PHURINE 5.0 10/03/2019 0139   GLUCOSEU >=500 (A) 10/03/2019 0139   HGBUR MODERATE (A) 10/03/2019 0139   BILIRUBINUR NEGATIVE 10/03/2019 0139   BILIRUBINUR Negative 04/02/2015 1509   KETONESUR 80 (A) 10/03/2019 0139   PROTEINUR NEGATIVE 10/03/2019 0139   UROBILINOGEN 0.2 04/02/2015 1509   NITRITE NEGATIVE 10/03/2019 0139   LEUKOCYTESUR MODERATE (A) 10/03/2019 0139    Radiological Exams on Admission: CT ABDOMEN PELVIS WO CONTRAST  Result Date: 10/03/2019 CLINICAL DATA:  Nominal distension. EXAM: CT ABDOMEN AND PELVIS WITHOUT CONTRAST TECHNIQUE: Multidetector CT imaging of the abdomen and pelvis was performed following the standard protocol without IV contrast. COMPARISON:  Contrast-enhanced CT 5 days ago FINDINGS: Lower chest: No consolidation or pleural fluid. Breathing motion artifact limits detailed  assessment. Hepatobiliary: Mild hepatic steatosis without focal lesion. Clips in the gallbladder fossa postcholecystectomy. No biliary dilatation. Pancreas: No ductal dilatation or inflammation. Spleen: Normal in size without focal abnormality. Adrenals/Urinary Tract: Normal adrenal glands. No hydronephrosis or perinephric edema. No renal or ureteral calculi. Urinary bladder is physiologically distended. No bladder wall thickening. Stomach/Bowel: Bowel evaluation limited in the absence of contrast and patient motion. Nondistended stomach. Few prominent fluid-filled small bowel without obstruction or inflammation. Normal appendix. Moderate colonic stool burden without inflammation. Vascular/Lymphatic: Abdominal aorta is normal in caliber. Minor aortic atherosclerosis. Prominent bilateral external iliac and inguinal nodes. Reproductive: Uterus and  bilateral adnexa are unremarkable. Other: Previous labial and right perineal soft tissue thickening has improved, however there is patchy soft tissue air in the right perineum, not entirely included in the field of view. This tracks anteriorly towards the right inguinal region. Heterogeneous stranding and induration of the anterior abdominal wall superficial to the abdominal wall musculature with ill-defined fluid measuring approximately 6.1 by 2.2 cm, series 3, image 73. Lack of contrast limits more detailed evaluation. There is no intra-abdominal free air free fluid. Small fat containing umbilical hernia. Musculoskeletal: Facet hypertrophy in the lower lumbar spine. There are no acute or suspicious osseous abnormalities. IMPRESSION: 1. Soft tissue gas in the right labia and perineum. Labial induration on CT 5 days ago has improved. This area is not entirely included in the field of view. Cannot differentiate soft tissue changes/air from recent incision and drainage versus necrotizing soft tissue infection. 2. There is new subcutaneous induration and inflammatory changes  involving the right anterior abdominal wall with ill-defined fluid measuring approximately 6.1 x 2.2 cm. This is suspicious for soft tissue infection with phlegmonous or early abscess changes. Inflammatory changes remains superficial to the anterior abdominal wall. 3. Prominent bilateral external iliac and inguinal nodes are likely reactive. 4. Mild hepatic steatosis. These results were called by telephone at the time of interpretation on 10/03/2019 at 12:57 am to [place] , who verbally acknowledged these results. Aortic Atherosclerosis (ICD10-I70.0). Electronically Signed   By: Keith Rake M.D.   On: 10/03/2019 00:58   DG Chest Port 1 View  Result Date: 10/02/2019 CLINICAL DATA:  Altered mental status EXAM: PORTABLE CHEST 1 VIEW COMPARISON:  01/16/2013 FINDINGS: Rotated patient. Slight asymmetric increased interstitial opacity left thorax. No consolidation. Normal heart size. Mild convex opacity at the right hilus likely due to vascular structure and patient rotation. No pneumothorax. IMPRESSION: Slight asymmetric interstitial opacity in the left thorax, question atypical infection, doubt asymmetric edema. Electronically Signed   By: Donavan Foil M.D.   On: 10/02/2019 22:47    EKG: Independently reviewed. Sinus tachycardia w/o acute changes  Assessment/Plan Active Problems:   Abscess of deep perineal space   Sepsis ([place])   Uncontrolled type 2 diabetes mellitus with hyperosmolar nonketotic hyperglycemia (HCC)   Delirium   Mood disorder (HCC)   Type 2 diabetes mellitus without complication ([place])  (please populate well all problems here in Problem List. (For example, if patient is on BP meds at home and you resume or decide to hold them, it is a problem that needs to be her. Same for CAD, COPD, HLD and so on)   1. Nonketotiic hyperosmolar hyperglaycemia - poorly controlled diabetic with infection as probable trigger. Plan Glucose controller/IV insulin protocol  Continue  hydration  2. Sepsis - patient with tachycardia, soft BP, leukocytosis, cellulitis on exam Plan Admit to step-down  Pharmacy consult for Vanco dosing, Rocephin 2 g q24  F/u CBC  3. Renal/electrolyte - 2/2 #1. Continue glycemic treatment and hydration with f/u lab  4. Delirirum - most likely 2/2 sepsis PLan Ativan q4prn  Haldol 5 mg IV q4 prn uncontrolled agitation  If not clearing reevaluation will be needed  5. Cellulitis - 2/2 abscess see #2  6. Derm- apparent yeast infection over wide area Plan Diflucan 100 mg daily.  DVT prophylaxis: lovenox (Lovenox/Heparin/SCD's/anticoagulated/None (if comfort care) Code Status: full code (Full/Partial (specify details) Family Communication: son had been present. Spoke with son -Marybelle Killings an update of problem list and tx plan. (Specify name, relationship. Do not write "  discussed with patient". Specify tel # if discussed over the phone) Disposition Plan: home 4-5 days (specify when and where you expect patient to be discharged) Consults called: GS - Dr.Kinsinger  (with names) Admission status: step-down (inpatient / obs / tele / medical floor / SDU)   Adella Hare MD [facility] Pager [phone]  If 7PM-7AM, please contact night-coverage [email or web address] Password [facility]  10/03/2019, 4:38 AM

## 2019-10-03 NOTE — ED Notes (Signed)
Per prior RN pt pulled out her Cortrak. Paged Cortrak team who advised pt would need to go to IR for placement. CCM aware.

## 2019-10-03 NOTE — Progress Notes (Signed)
Pharmacy Antibiotic Note  AKEIA PEROT is a 54 y.o. female admitted on 10/02/2019 with cellulitis.  Pharmacy has been consulted for Vancomycin dosing. Recent I&D of groin abscess. WBC is elevated. Acute renal failure (baseline is normal).   Plan: Vancomycin 1000 mg IV q24h, increase dose if renal function improves >>Estimated AUC: 438 Ceftriaxone per MD Trend WBC, temp, renal function  F/U infectious work-up Drug levels as indicated   Weight: 246 lb 14.6 oz (112 kg)  Temp (24hrs), Avg:98.8 F (37.1 C), Min:98.8 F (37.1 C), Max:98.8 F (37.1 C)  Recent Labs  Lab 09/27/19 2339 10/02/19 2241  WBC 18.4* 21.0*  CREATININE 0.82 2.08*  LATICACIDVEN 1.9 2.4*    Estimated Creatinine Clearance: 39.2 mL/min (A) (by C-G formula based on SCr of 2.08 mg/dL (H)).    Allergies  Allergen Reactions  . Codeine   . Guaifenesin Hives   Abran Duke, PharmD, BCPS Clinical Pharmacist Phone: [phone]

## 2019-10-03 NOTE — Consult Note (Signed)
**Note Stephanie-Identified via Obfuscation** Reason for Consult: soft tissue infection Referring Physician: Reino Bellis [name] is an 54 y.o. female.  HPI: 54 yo female came to the ER 5 days ago with perineal infection and underwent incision and drainage in the ER. She was recommended to be admitted for further care but refused and left. She was in normal health yesterday morning but when her son returned from work she was found on the ground. The son is not sure if she takes any medication for her diabetes now.  Past Medical History:  Diagnosis Date  . Allergy   . Anxiety   . Asthma   . Depression   . Diabetes mellitus without complication [facility])     Past Surgical History:  Procedure Laterality Date  . CHOLECYSTECTOMY    . TUBAL LIGATION      Family History  Problem Relation Age of Onset  . Stroke Mother   . Heart disease Father     Social History:  reports that she has been smoking cigarettes. She has a 34.00 pack-year smoking history. She has never used smokeless tobacco. She reports that she does not drink alcohol or use drugs.  Allergies:  Allergies  Allergen Reactions  . Codeine   . Guaifenesin Hives    Medications: I have reviewed the patient's current medications.  Results for orders placed or performed during the hospital encounter of 10/02/19 (from the past 48 hour(s))  CBG monitoring, ED     Status: Abnormal   Collection Time: 10/02/19 10:18 PM  Result Value Ref Range   Glucose-Capillary >600 (HH) 70 - 99 mg/dL  CBC     Status: Abnormal   Collection Time: 10/02/19 10:41 PM  Result Value Ref Range   WBC 21.0 (H) 4.0 - 10.5 K/uL   RBC 5.99 (H) 3.87 - 5.11 MIL/uL   Hemoglobin 18.2 (H) 12.0 - 15.0 g/dL   HCT 22.4 (H) 82.5 - 00.3 %   MCV 96.0 80.0 - 100.0 fL   MCH 30.4 26.0 - 34.0 pg   MCHC 31.7 30.0 - 36.0 g/dL   RDW 70.4 88.8 - 91.6 %   Platelets 321 150 - 400 K/uL   nRBC 0.1 0.0 - 0.2 %    Comment: Performed at [facility], [address] [place], [place] [address]  Lactic  acid, plasma     Status: Abnormal   Collection Time: 10/02/19 10:41 PM  Result Value Ref Range   Lactic Acid, Venous 2.4 (HH) 0.5 - 1.9 mmol/L    Comment: CRITICAL RESULT CALLED TO, READ BACK BY AND VERIFIED WITH: DENNIS A,RN 10/02/19 2344 WAYK Performed at [facility], [address], [place], [place] [address]   Comprehensive metabolic panel     Status: Abnormal   Collection Time: 10/02/19 10:41 PM  Result Value Ref Range   Sodium 161 (HH) 135 - 145 mmol/L   Potassium 4.9 3.5 - 5.1 mmol/L   Chloride 109 98 - 111 mmol/L   CO2 18 (L) 22 - 32 mmol/L   Glucose, Bld 973 (HH) 70 - 99 mg/dL    Comment: CRITICAL RESULT CALLED TO, READ BACK BY AND VERIFIED WITH: DENNIS A,RN  10/02/19 2350 WAYK    BUN 53 (H) 6 - 20 mg/dL   Creatinine, Ser 0.03 (H) 0.44 - 1.00 mg/dL   Calcium 49.1 (H) 8.9 - 10.3 mg/dL   Total Protein 7.8 6.5 - 8.1 g/dL   Albumin 2.8 (L) 3.5 - 5.0 g/dL   AST  17 15 - 41 U/L   ALT 24 0 - 44 U/L   Alkaline Phosphatase 176 (H) 38 - 126 U/L   Total Bilirubin 2.9 (H) 0.3 - 1.2 mg/dL   GFR calc non Af Amer 26 (L) >60 mL/min   GFR calc Af Amer 31 (L) >60 mL/min   Anion gap 34 (H) 5 - 15    Comment: Performed at [facility], [address] [place], [place] [address]  CK     Status: Abnormal   Collection Time: 10/02/19 10:41 PM  Result Value Ref Range   Total CK 19 (L) 38 - 234 U/L    Comment: Performed at [facility], [address] [place], [place] [address]  Troponin I (High Sensitivity)     Status: Abnormal   Collection Time: 10/02/19 10:41 PM  Result Value Ref Range   Troponin I (High Sensitivity) 22 (H) <18 ng/L    Comment: (NOTE) Elevated high sensitivity troponin I (hsTnI) values and significant  changes across serial measurements may suggest ACS but many other  chronic and acute conditions are known to elevate hsTnI results.  Refer to the Links section for chest pain algorithms and additional  guidance. Performed at [facility],  [address], [place], [place] [address]   Ammonia     Status: None   Collection Time: 10/02/19 10:41 PM  Result Value Ref Range   Ammonia 32 9 - 35 umol/L    Comment: Performed at [facility], [address], [place], [place] [address]  Ethanol     Status: None   Collection Time: 10/02/19 10:41 PM  Result Value Ref Range   Alcohol, Ethyl (B) <10 <10 mg/dL    Comment: (NOTE) Lowest detectable limit for serum alcohol is 10 mg/dL. For medical purposes only. Performed at [facility], [address] [place], [place] [address]   Respiratory Panel by RT PCR (Flu A&B, Covid) - Nasopharyngeal Swab     Status: None   Collection Time: 10/02/19 10:46 PM   Specimen: Nasopharyngeal Swab  Result Value Ref Range   SARS Coronavirus 2 by RT PCR NEGATIVE NEGATIVE    Comment: (NOTE) SARS-CoV-2 target nucleic acids are NOT DETECTED. The SARS-CoV-2 RNA is generally detectable in upper respiratoy specimens during the acute phase of infection. The lowest concentration of SARS-CoV-2 viral copies this assay can detect is 131 copies/mL. A negative result does not preclude SARS-Cov-2 infection and should not be used as the sole basis for treatment or other patient management decisions. A negative result may occur with  improper specimen collection/handling, submission of specimen other than nasopharyngeal swab, presence of viral mutation(s) within the areas targeted by this assay, and inadequate number of viral copies (<131 copies/mL). A negative result must be combined with clinical observations, patient history, and epidemiological information. The expected result is Negative. Fact Sheet for Patients:  https://www.moore.com/ Fact Sheet for Healthcare Providers:  https://www.young.biz/ This test is not yet ap proved or cleared by the [place] FDA and  has been authorized for detection and/or diagnosis of SARS-CoV-2 by FDA under an Emergency Use  Authorization (EUA). This EUA will remain  in effect (meaning this test can be used) for the duration of the COVID-19 declaration under Section 564(b)(1) of the Act, 21 U.S.C. section 360bbb-3(b)(1), unless the authorization is terminated or revoked sooner.    Influenza A by PCR NEGATIVE NEGATIVE   Influenza B by PCR NEGATIVE NEGATIVE  Comment: (NOTE) The Xpert Xpress SARS-CoV-2/FLU/RSV assay is intended as an aid in  the diagnosis of influenza from Nasopharyngeal swab specimens and  should not be used as a sole basis for treatment. Nasal washings and  aspirates are unacceptable for Xpert Xpress SARS-CoV-2/FLU/RSV  testing. Fact Sheet for Patients: PinkCheek.be Fact Sheet for Healthcare Providers: GravelBags.it This test is not yet approved or cleared by the [place] FDA and  has been authorized for detection and/or diagnosis of SARS-CoV-2 by  FDA under an Emergency Use Authorization (EUA). This EUA will remain  in effect (meaning this test can be used) for the duration of the  Covid-19 declaration under Section 564(b)(1) of the Act, 21  U.S.C. section 360bbb-3(b)(1), unless the authorization is  terminated or revoked. Performed at [facility], [address] [place], [place] [address]   I-STAT 7, (LYTES, BLD GAS, ICA, H+H)     Status: Abnormal   Collection Time: 10/02/19 10:53 PM  Result Value Ref Range   pH, Arterial 7.365 7.350 - 7.450   pCO2 arterial 34.5 32.0 - 48.0 mmHg   pO2, Arterial 84.0 83.0 - 108.0 mmHg   Bicarbonate 19.8 (L) 20.0 - 28.0 mmol/L   TCO2 21 (L) 22 - 32 mmol/L   O2 Saturation 96.0 %   Acid-base deficit 5.0 (H) 0.0 - 2.0 mmol/L   Sodium 157 (H) 135 - 145 mmol/L   Potassium 4.5 3.5 - 5.1 mmol/L   Calcium, Ion 1.38 1.15 - 1.40 mmol/L   HCT 48.0 (H) 36.0 - 46.0 %   Hemoglobin 16.3 (H) 12.0 - 15.0 g/dL   Patient temperature 97.8 F    Collection site RADIAL, ALLEN'S TEST ACCEPTABLE      Drawn by RT    Sample type ARTERIAL   CBG monitoring, ED     Status: Abnormal   Collection Time: 10/03/19  1:14 AM  Result Value Ref Range   Glucose-Capillary >600 (HH) 70 - 99 mg/dL    CT ABDOMEN PELVIS WO CONTRAST  Result Date: 10/03/2019 CLINICAL DATA:  Nominal distension. EXAM: CT ABDOMEN AND PELVIS WITHOUT CONTRAST TECHNIQUE: Multidetector CT imaging of the abdomen and pelvis was performed following the standard protocol without IV contrast. COMPARISON:  Contrast-enhanced CT 5 days ago FINDINGS: Lower chest: No consolidation or pleural fluid. Breathing motion artifact limits detailed assessment. Hepatobiliary: Mild hepatic steatosis without focal lesion. Clips in the gallbladder fossa postcholecystectomy. No biliary dilatation. Pancreas: No ductal dilatation or inflammation. Spleen: Normal in size without focal abnormality. Adrenals/Urinary Tract: Normal adrenal glands. No hydronephrosis or perinephric edema. No renal or ureteral calculi. Urinary bladder is physiologically distended. No bladder wall thickening. Stomach/Bowel: Bowel evaluation limited in the absence of contrast and patient motion. Nondistended stomach. Few prominent fluid-filled small bowel without obstruction or inflammation. Normal appendix. Moderate colonic stool burden without inflammation. Vascular/Lymphatic: Abdominal aorta is normal in caliber. Minor aortic atherosclerosis. Prominent bilateral external iliac and inguinal nodes. Reproductive: Uterus and bilateral adnexa are unremarkable. Other: Previous labial and right perineal soft tissue thickening has improved, however there is patchy soft tissue air in the right perineum, not entirely included in the field of view. This tracks anteriorly towards the right inguinal region. Heterogeneous stranding and induration of the anterior abdominal wall superficial to the abdominal wall musculature with ill-defined fluid measuring approximately 6.1 by 2.2 cm, series 3, image 73. Lack  of contrast limits more detailed evaluation. There is no intra-abdominal free air free fluid. Small fat containing umbilical hernia. Musculoskeletal: Facet hypertrophy in the  lower lumbar spine. There are no acute or suspicious osseous abnormalities. IMPRESSION: 1. Soft tissue gas in the right labia and perineum. Labial induration on CT 5 days ago has improved. This area is not entirely included in the field of view. Cannot differentiate soft tissue changes/air from recent incision and drainage versus necrotizing soft tissue infection. 2. There is new subcutaneous induration and inflammatory changes involving the right anterior abdominal wall with ill-defined fluid measuring approximately 6.1 x 2.2 cm. This is suspicious for soft tissue infection with phlegmonous or early abscess changes. Inflammatory changes remains superficial to the anterior abdominal wall. 3. Prominent bilateral external iliac and inguinal nodes are likely reactive. 4. Mild hepatic steatosis. These results were called by telephone at the time of interpretation on 10/03/2019 at 12:57 am to [name] , who verbally acknowledged these results. Aortic Atherosclerosis (ICD10-I70.0). Electronically Signed   By: Narda Rutherford M.D.   On: 10/03/2019 00:58   DG Chest Port 1 View  Result Date: 10/02/2019 CLINICAL DATA:  Altered mental status EXAM: PORTABLE CHEST 1 VIEW COMPARISON:  01/16/2013 FINDINGS: Rotated patient. Slight asymmetric increased interstitial opacity left thorax. No consolidation. Normal heart size. Mild convex opacity at the right hilus likely due to vascular structure and patient rotation. No pneumothorax. IMPRESSION: Slight asymmetric interstitial opacity in the left thorax, question atypical infection, doubt asymmetric edema. Electronically Signed   By: Jasmine Pang M.D.   On: 10/02/2019 22:47    Review of Systems  Unable to perform ROS: Mental acuity   Blood pressure 112/72, pulse (!) 120, temperature 98.8 F  (37.1 C), resp. rate (!) 30, weight 112 kg, SpO2 94 %. Physical Exam  Vitals reviewed. Constitutional: She appears well-developed and well-nourished.  HENT:  Head: Normocephalic and atraumatic.  Eyes: Pupils are equal, round, and reactive to light. Conjunctivae and EOM are normal.  Cardiovascular: Normal rate and regular rhythm.  Respiratory: Effort normal and breath sounds normal.  GI: Soft. She exhibits no distension. There is abdominal tenderness.  Lower mid abdomen with erythema  Genitourinary:    Genitourinary Comments: Right sided incision in anterior perineum without drainage or erythema   Musculoskeletal:        General: Normal range of motion.     Cervical back: Normal range of motion and neck supple.  Neurological: She is alert.  Answers that she is ok to all questions  Skin: Skin is warm and dry.    Assessment/Plan: 54 yo female who had a perineal abscess s/p drainage. She presents today with altered mental status and hyperglycemia. The incision is open without surrounding induration, fluctuance or erythema. She has erythema over her lower abdomen. CT scan shows air in the perineum likely post procedural. There is additional fluid in her abdominal wall. -IV abx -will reevaluate, abdominal collection may require drainage once metabolic derangement improves  [name] 10/03/2019, 1:31 AM

## 2019-10-03 NOTE — Procedures (Signed)
Cortrak  Person Inserting Tube:  Jaylanni Eltringham, RD Tube Type:  Cortrak - 43 inches Tube Location:  Right nare Initial Placement:  Stomach Secured by: Bridle Technique Used to Measure Tube Placement:  Documented cm marking at nare/ corner of mouth Cortrak Secured At:  65 cm   No x-ray is required. RN may begin using tube.   If the tube becomes dislodged please keep the tube and contact the Cortrak team at [email or web address] (password [email or web address]) for replacement.  If after hours and replacement cannot be delayed, place a NG tube and confirm placement with an abdominal x-ray.    Kaidyn Javid RD, LDN Clinical Nutrition Pager listed in AMION    

## 2019-10-03 NOTE — ED Notes (Signed)
Dr. Mikey Bussing at bedside, aware of latest sodium value

## 2019-10-03 NOTE — ED Notes (Signed)
NG placement attempted by 3 RN's; unable to get tube placed d/t nasal obstruction; Joneen Roach, NP, aware

## 2019-10-03 NOTE — Progress Notes (Signed)
CC: Altered mental status  Subjective: 54 year old female was seen in the ED 5 days ago with a perineal infection underwent I&D in the ED.  They recommended admission but she refused.  She was found on the ground at home by her son with altered mental status.  She was brought to the ED. Exam this a.m. shows the I&D site the right labial infection is open, is about 3 cm deep.  There are some cellulitis around it is also some cellulitis her abdomen is pictured below.  There is also a reddened area within the midline of the pannus.  Objective: Vital signs in last 24 hours: Temp:  [98.8 F (37.1 C)-99.1 F (37.3 C)] 99.1 F (37.3 C) (02/16 0823) Pulse Rate:  [45-130] 130 (02/16 0745) Resp:  [21-50] 21 (02/16 0745) BP: (104-134)/(67-99) 134/85 (02/16 0730) SpO2:  [88 %-99 %] 92 % (02/16 0745) Weight:  [892 kg] 112 kg (02/15 2222)  No I&O so far  Currently she is afebrile T-max 9.1 axillary.  Respiratory rates in the 20s and 30s.  Heart rates in the 120s to 130 range currently.  O2 sats dropped down to 89%, earlier this AM. Admission labs: NA 170, Chloride 123, glucose 569, creatinine 1.68 troponin XX 4, lactate 2.5, WBC 21.4, hemoglobin 16.4, hematocrit 52.2, platelets 269,000.  Urinalysis unremarkable.  Respiratory panel on admission was negative. CT of the abdomen the pelvis on her original ED evaluation 09/28/2019 showed a posterior left labial/perineal soft tissue abscess measuring 5.3 x 1.6 x 2.1 with significant surrounding inflammatory changes.  There is a nonloculated fluid/phlegmon extends anteriorly within the labial fold with no subcutaneous emphysema noted. CT scan abdomen/pelvis without contrast 2/16: Shows soft tissue gas in the right labial/perineum.  Labial induration on CT 5 days ago which has improved there is a new subcutaneous induration inflammatory changes involving the right anterior abdominal wall which is ill-defined fluid measuring approximately 6 x 2.2 cm.   Suspicious for soft tissue infection with phlegmonous or early abscess changes.  Intake/Output from previous day: No intake/output data recorded. Intake/Output this shift: Total I/O In: 190 [I.V.:90; IV Piggyback:100] Out: -   General appearance: Patient is confused, but awake and answering some questions. Resp: clear to auscultation bilaterally Skin: Right perineal I&D site is open and clean.  Is about 1-1/2 cm wide and 3 cm deep.  She has 2 areas as noted above one in the midline of her pannus fold, and then another area above on the abdominal wall with cellulitis.  No palpable fluid in either of these sites on exam.    Lab Results:  Recent Labs    10/03/19 0405 10/03/19 0405 10/03/19 0430 10/03/19 0728  WBC 21.4*  --  22.8*  --   HGB 16.4*   < > 16.4* 19.0*  HCT 52.2*   < > 52.1* 56.0*  PLT 269  --  267  --    < > = values in this interval not displayed.    BMET Recent Labs    10/03/19 0405 10/03/19 0405 10/03/19 0430 10/03/19 0728  NA 170*   < > 171* 173*  K 4.3   < > 3.7 3.3*  CL 123*  --  124*  --   CO2 24  --  27  --   GLUCOSE 569*  --  402*  --   BUN 48*  --  44*  --   CREATININE 1.68*  --  1.26*  --   CALCIUM 9.5  --  9.6  --    < > = values in this interval not displayed.   PT/INR No results for input(s): LABPROT, INR in the last 72 hours.  Recent Labs  Lab 10/02/19 2241  AST 17  ALT 24  ALKPHOS 176*  BILITOT 2.9*  PROT 7.8  ALBUMIN 2.8*     Lipase  No results found for: LIPASE   Medications: . atorvastatin  10 mg Oral Daily  . enoxaparin (LOVENOX) injection  40 mg Subcutaneous Q24H  . free water  500 mL Per Tube Q4H  . loratadine  10 mg Oral Daily  . LORazepam       . sodium chloride 75 mL/hr at 10/03/19 0810  . cefTRIAXone (ROCEPHIN)  IV Stopped (10/03/19 0803)  . dextrose 5 % and 0.45% NaCl    . insulin 7 Units/hr (10/03/19 3646)  . vancomycin      Assessment/Plan Altered mental status/syncope Uncontrolled type 2  diabetes Hyper osmolar nonketotic hyperglycemia Delirium Hypernatremia Acute kidney disease -creatinine 1.68 Severe dehydration  Sepsis Perineal infection with I&D in ED 09/28/2019 Abdominal wall cellulitis Leukocytosis  FEN: IV fluids/n.p.o. ID: Maxipime 2/15 x 1; Rocephin 2/16>> day 1; vancomycin 2/15>> day 2 DVT: Lovenox Follow-up: TBD   Plan: Currently are continue to irrigate the open site with saline, repack with half-inch dressing gauze once a day.  Packing was just removed.  Continue antibiotics, work on correcting electrolyte and glucose abnormalities.  We will follow with you.  LOS: 0 days    Khyren Hing 10/03/2019 Please see Amion

## 2019-10-03 NOTE — ED Notes (Signed)
Patient O2 saturation 86% on 6L. MD paged.

## 2019-10-03 NOTE — Consult Note (Signed)
NAME:  [name], MRN:  [id number], DOB:  [birth year], LOS: 0 ADMISSION DATE:  10/02/2019, CONSULTATION DATE:  2/16 REFERRING MD:  Dr. Tyrell Antonio, CHIEF COMPLAINT:  Hypernatremia, encephalopathy   Brief History   54 year old female admitted for encephalopathy in the setting of HHNK and hypernatremia.  History of present illness   54 year old female with PMH as below, which is significant for DM and asthma. She was recently 2/10 evaluated in [facility] long emergency department with complaints of perineal abscess.  This was drained and packed in the emergency department as the patient refused admission to the hospital at that time.  Then, in the evening hours of 2/15 she presented to [facility] emergency department after being found down by her son at home.  It is unknown how long she was down.  Upon EMS arrival she was noted to be confused, Tachypneic, and hypoxemic to 88% on room air.  Upon arrival to the emergency department she was noted to have profoundly elevated levels of serum glucose and sodium.  She was treated for DKA versus HHS with IV fluid administration and insulin infusion.  CT scan of the abdomen was done to further evaluate the extent of the perineal abscess.  This showed gas in the right labia with extension of infection to the abdominal wall and rectus muscle.  She has been evaluated by general surgery in the emergency department who recommend IV antibiotics alone at this time.  Because of ongoing encephalopathy PCCM was asked to evaluate for admission to ICU.   Past Medical History   has a past medical history of Allergy, Anxiety, Asthma, Depression, and Diabetes mellitus without complication ([place]).   Significant Hospital Events   2/15 admit  2/16 urine  Consults:  Surgery  Procedures:  2/10 I?D perineal abscess.   Significant Diagnostic Tests:  CT abdomen/pelvis 2/15 > Soft tissue gas in the right labia and perineum. Labial induration on CT 5 days ago has improved.  There is new subcutaneous induration and inflammatory changes involving the right anterior abdominal wall with ill-defined fluid measuring approximately 6.1 x 2.2 cm.  Micro Data:  Flu/Covid negative on admission Blood 2/15 >  Antimicrobials:  Clindamycin 2/10 > 2/15 Cefepime 2/15 Flagyl 2/15  Vancomycin 2/15 >>> Ceftriaxone 2/15 >>>  Interim history/subjective:    Objective   Blood pressure 134/85, pulse (!) 130, temperature 99.1 F (37.3 C), temperature source Axillary, resp. rate (!) 21, weight 112 kg, SpO2 92 %.        Intake/Output Summary (Last 24 hours) at 10/03/2019 0834 Last data filed at 10/03/2019 0816 Gross per 24 hour  Intake 190 ml  Output --  Net 190 ml   Filed Weights   10/02/19 2222  Weight: 112 kg    Examination: General: Morbidly obese middle aged female in NAD HENT: [place]/AT, PERRL, no JVD Lungs: Clear bilateral breath sounds Cardiovascular: Tachy, regular, no MRG Abdomen: Soft, normoactive BS, diffusely tender. Subumbilical erythema.  Extremities: No acute deformity or ROM limitation Neuro: lethargic. Arouses to verbal and is delirious and moans.  GU: Foley Skin: Perineal abscesses.  Resolved Hospital Problem list     Assessment & Plan:   Diabetic ketoacidosis DM: poorly controlled historically - 3L IVF given in ED - Insulin infusion via endotool - Serial BMP - Hourly CBG monitoring.   Hypernatremia 160 on admit. 170 on next check. Free water deficit nearly 10L on admission.  - half-normal saline infusion at 12m/Hr until glucose less than 250,  then transition to D5. - NGT being placed for free water admin  - BMP every 4 hours  Sepsis secondary to cellulitis and perineal abscess. Abscess was drained 2/10. Has been on clindamycin since 2/10. CT abdomen shows gas in the R labia and extension of infection into soft tissue of the lower abdomen.  - CTX and Vancomycin - Follow blood cultures - General surgery following, recommending IV abx  alone at this time.   Acute metabolic encephalopathy:  In the setting of DKA, sepsis - Supportive care as above  AKI: improving - Continued IVF - Follow BMP  Hypokalemia - Supplementation ordered by hospitalists this morning - Follow repeat BMP as scheduled   Best practice:  Diet: NPO Pain/Anxiety/Delirium protocol (if indicated):NA VAP protocol (if indicated): NA DVT prophylaxis: Lovenox GI prophylaxis: NA Glucose control: Insulin infusion Mobility: BR Code Status: FULL Family Communication: Son Remo Lipps updated Disposition: ICU  Labs   CBC: Recent Labs  Lab 09/27/19 2339 09/27/19 2339 10/02/19 2241 10/02/19 2253 10/03/19 0405 10/03/19 0430 10/03/19 0728  WBC 18.4*  --  21.0*  --  21.4* 22.8*  --   NEUTROABS 15.9*  --   --   --  16.0*  --   --   HGB 16.8*   < > 18.2* 16.3* 16.4* 16.4* 19.0*  HCT 49.8*   < > 57.5* 48.0* 52.2* 52.1* 56.0*  MCV 90.9  --  96.0  --  95.6 95.8  --   PLT 143*  --  321  --  269 267  --    < > = values in this interval not displayed.    Basic Metabolic Panel: Recent Labs  Lab 09/27/19 2339 09/27/19 2339 10/02/19 2241 10/02/19 2253 10/03/19 0405 10/03/19 0430 10/03/19 0728  NA 134*   < > 161* 157* 170* 171* 173*  K 3.8   < > 4.9 4.5 4.3 3.7 3.3*  CL 100  --  109  --  123* 124*  --   CO2 19*  --  18*  --  24 27  --   GLUCOSE 433*  --  973*  --  569* 402*  --   BUN 15  --  53*  --  48* 44*  --   CREATININE 0.82  --  2.08*  --  1.68* 1.26*  --   CALCIUM 9.0  --  10.7*  --  9.5 9.6  --    < > = values in this interval not displayed.   GFR: Estimated Creatinine Clearance: 64.8 mL/min (A) (by C-G formula based on SCr of 1.26 mg/dL (H)). Recent Labs  Lab 09/27/19 2339 10/02/19 2241 10/03/19 0405 10/03/19 0430  WBC 18.4* 21.0* 21.4* 22.8*  LATICACIDVEN 1.9 2.4* 2.5*  --     Liver Function Tests: Recent Labs  Lab 10/02/19 2241  AST 17  ALT 24  ALKPHOS 176*  BILITOT 2.9*  PROT 7.8  ALBUMIN 2.8*   No results for  input(s): LIPASE, AMYLASE in the last 168 hours. Recent Labs  Lab 10/02/19 2241  AMMONIA 32    ABG    Component Value Date/Time   PHART 7.422 10/03/2019 0728   PCO2ART 47.6 10/03/2019 0728   PO2ART 63.0 (L) 10/03/2019 0728   HCO3 31.0 (H) 10/03/2019 0728   TCO2 32 10/03/2019 0728   ACIDBASEDEF 5.0 (H) 10/02/2019 2253   O2SAT 92.0 10/03/2019 0728     Coagulation Profile: No results for input(s): INR, PROTIME in the last 168 hours.  Cardiac Enzymes: Recent  Labs  Lab 10/02/19 2241  CKTOTAL 19*    HbA1C: Hemoglobin A1C  Date/Time Value Ref Range Status  04/02/2015 03:08 PM 10.4  Final    CBG: Recent Labs  Lab 10/03/19 0250 10/03/19 0405 10/03/19 0543 10/03/19 0649 10/03/19 0812  GLUCAP 560* 522* 465* 425* 331*    Review of Systems:   Unable as patient is encephalopathic and intuabted  Past Medical History  She,  has a past medical history of Allergy, Anxiety, Asthma, Depression, and Diabetes mellitus without complication ([place]).   Surgical History    Past Surgical History:  Procedure Laterality Date  . CHOLECYSTECTOMY    . TUBAL LIGATION       Social History   reports that she has been smoking cigarettes. She has a 34.00 pack-year smoking history. She has never used smokeless tobacco. She reports that she does not drink alcohol or use drugs.   Family History   Her family history includes Heart disease in her father; Stroke in her mother.   Allergies Allergies  Allergen Reactions  . Codeine   . Guaifenesin Hives     Home Medications  Prior to Admission medications   Medication Sig Start Date End Date Taking? Authorizing Provider  clindamycin (CLEOCIN) 150 MG capsule Take 2 capsules (300 mg total) by mouth 4 (four) times daily. 09/28/19  Yes Pollina, Gwenyth Allegra, MD  EPINEPHrine (PRIMATENE MIST) 0.125 MG/ACT AERO Inhale 2 puffs into the lungs every 4 (four) hours as needed (BREATHING ISSUES).   Yes [provider]  albuterol (VENTOLIN  HFA) 108 (90 Base) MCG/ACT inhaler INHALE TWO PUFFS BY MOUTH 4 TIMES DAILY AS NEEDED.  Pt prefers ventolin brand Patient not taking: Reported on 10/02/2019 09/26/16   Tenna Delaine D, PA-C  atorvastatin (LIPITOR) 10 MG tablet Take 1 tablet (10 mg total) by mouth daily. Patient not taking: Reported on 10/02/2019 11/23/15   Leandrew Koyanagi, MD  beclomethasone (QVAR) 40 MCG/ACT inhaler Inhale 1 puff into the lungs 2 (two) times daily. Patient not taking: Reported on 10/02/2019 09/26/16   Tenna Delaine D, PA-C  blood glucose meter kit and supplies KIT Dispense based on patient and insurance preference. Use up to four times daily as directed. (FOR ICD-9 250.00, 250.01). 04/02/15   McVeigh, Sherren Mocha, PA  HYDROcodone-homatropine (HYCODAN) 5-1.5 MG/5ML syrup Take 5 mLs by mouth every 8 (eight) hours as needed for cough. Please make sure this does not have guaifenesin in it please Patient not taking: Reported on 10/02/2019 12/08/15   Gale Journey, Damaris Hippo, PA-C  lisinopril (PRINIVIL,ZESTRIL) 2.5 MG tablet Take 1 tablet (2.5 mg total) by mouth daily. Patient not taking: Reported on 10/02/2019 11/23/15   Leandrew Koyanagi, MD  loratadine (CLARITIN) 10 MG tablet Take 10 mg by mouth daily.    [provider]  metFORMIN (GLUCOPHAGE) 500 MG tablet Take 2 tablets (1,000 mg total) by mouth 2 (two) times daily with a meal. Patient not taking: Reported on 10/02/2019 11/23/15   Leandrew Koyanagi, MD  montelukast (SINGULAIR) 10 MG tablet Take 1 tablet (10 mg total) by mouth at bedtime. Patient not taking: Reported on 10/02/2019 12/31/14   Copland, Gay Filler, MD     Critical care time: 68 mins      Georgann Housekeeper, AGACNP-BC [place] for personal pager PCCM on call pager [phone]  10/03/2019 8:50 AM

## 2019-10-03 NOTE — Progress Notes (Signed)
PROGRESS NOTE    Anne-Louise Fiebig Charlot  ZCH:[id number] DOB: 1965/09/05 DOA: 10/02/2019 PCP: Patient, No Pcp Per   Brief Narrative:    Assessment & Plan:   Active Problems:   Mood disorder (HCC)   Type 2 diabetes mellitus without complication (HCC)   Abscess of deep perineal space   Sepsis (HCC)   Uncontrolled type 2 diabetes mellitus with hyperosmolar nonketotic hyperglycemia (HCC)   Delirium  1-Sepsis;  CT abdomen pelvis: labial induration, gas right labia and perineum. Induration involving abdominal wall.  Presents with AMS, AKI, multiple organ failure, source of infection perineum abscess.  Continue with IV fluids.  IV vancomycin and ceftriaxone.  Surgery has been consulted.   2-Severe Hypernatremia, water deficit.  More than 12 L water deficit.  Change IV fluid NS to half NS.  Give IV bolus LR.  Needs NG tube for free water. Order 50 cc every 4 hours. Monitor Bmet  CCM consulted.   3-Nonketotic Hyperosmolar Hyperglycemic state; DM Continue with Insulin Gtt.  IV fluids.   4-Hypokalemia; IV kcl supplement.   5-Acute Hypoxic Respiratory Failure;  In setting of sepsis, concern for ARDS, Aspiration PNA.  Chest x ray; no cardiopulmonary diseases.  CCM consulted.   6-Mild elevation of troponin. Follow trend.   7-Acute Metabolic encephalopathy;  Multifactorial. Delirium, hypernatremia, infection.   8-AKI; in setting Sepsis, hypovolemia;  IV fluids.   Estimated body mass index is 39.85 kg/m as calculated from the following:   Height as of 09/28/19: 5\' 6"  (1.676 m).   Weight as of this encounter: 112 kg.   DVT prophylaxis: Lovenox Code Status: Full code Family Communication: no family at bedside, son was updated earlier by admitting physician.  Disposition Plan:  Patient is from: Home  Anticipated d/c date: 5 days Barriers to d/c or necessity for inpatient status: sepsis, severe water deficit, hypernatremia, HONK  Consultants:  CCM Surgery  Procedures:   Ng  tube   Antimicrobials:  Ceftriaxone 2-15 Vancomycin 2-15  Subjective: Lethargic, Moaning, not following command.   Objective: Vitals:   10/03/19 0500 10/03/19 0530 10/03/19 0730 10/03/19 0745  BP: (!) 116/93 112/70 134/85   Pulse: (!) 125 (!) 127 (!) 130 (!) 130  Resp: (!) 31 (!) 30 (!) 29 (!) 21  Temp:      SpO2: 92% (!) 89% (!) 89% 92%  Weight:       No intake or output data in the 24 hours ending 10/03/19 0801 Filed Weights   10/02/19 2222  Weight: 112 kg    Examination:  General exam: Lethargic, ill appearing, Moaning Respiratory system: Tachypnea, B/L crackles Cardiovascular system: S1 & S2 heard, tachycardic, No JVD, murmurs, rubs, gallops or clicks. No pedal edema. Gastrointestinal system: Abdomen is nondistended, soft tenderness specially supra-pubic area. Redness abominal wall  Central nervous system: lethargic, moaning, not following command.  Extremities: Symmetric 5 x 5 power. Skin: No rashes, lesions or ulcers Psychiatry: moaning.   Data Reviewed: I have personally reviewed following labs and imaging studies  CBC: Recent Labs  Lab 09/27/19 2339 09/27/19 2339 10/02/19 2241 10/02/19 2253 10/03/19 0405 10/03/19 0430 10/03/19 0728  WBC 18.4*  --  21.0*  --  21.4* 22.8*  --   NEUTROABS 15.9*  --   --   --  16.0*  --   --   HGB 16.8*   < > 18.2* 16.3* 16.4* 16.4* 19.0*  HCT 49.8*   < > 57.5* 48.0* 52.2* 52.1* 56.0*  MCV 90.9  --  96.0  --  95.6 95.8  --   PLT 143*  --  321  --  269 267  --    < > = values in this interval not displayed.   Basic Metabolic Panel: Recent Labs  Lab 09/27/19 2339 10/02/19 2241 10/02/19 2253 10/03/19 0405 10/03/19 0728  NA 134* 161* 157* 170* 173*  K 3.8 4.9 4.5 4.3 3.3*  CL 100 109  --  123*  --   CO2 19* 18*  --  24  --   GLUCOSE 433* 973*  --  569*  --   BUN 15 53*  --  48*  --   CREATININE 0.82 2.08*  --  1.68*  --   CALCIUM 9.0 10.7*  --  9.5  --    GFR: Estimated Creatinine Clearance: 48.6 mL/min (A)  (by C-G formula based on SCr of 1.68 mg/dL (H)). Liver Function Tests: Recent Labs  Lab 10/02/19 2241  AST 17  ALT 24  ALKPHOS 176*  BILITOT 2.9*  PROT 7.8  ALBUMIN 2.8*   No results for input(s): LIPASE, AMYLASE in the last 168 hours. Recent Labs  Lab 10/02/19 2241  AMMONIA 32   Coagulation Profile: No results for input(s): INR, PROTIME in the last 168 hours. Cardiac Enzymes: Recent Labs  Lab 10/02/19 2241  CKTOTAL 19*   BNP (last 3 results) No results for input(s): PROBNP in the last 8760 hours. HbA1C: No results for input(s): HGBA1C in the last 72 hours. CBG: Recent Labs  Lab 10/03/19 0114 10/03/19 0250 10/03/19 0405 10/03/19 0543 10/03/19 0649  GLUCAP >600* 560* 522* 465* 425*   Lipid Profile: No results for input(s): CHOL, HDL, LDLCALC, TRIG, CHOLHDL, LDLDIRECT in the last 72 hours. Thyroid Function Tests: No results for input(s): TSH, T4TOTAL, FREET4, T3FREE, THYROIDAB in the last 72 hours. Anemia Panel: No results for input(s): VITAMINB12, FOLATE, FERRITIN, TIBC, IRON, RETICCTPCT in the last 72 hours. Sepsis Labs: Recent Labs  Lab 09/27/19 2339 10/02/19 2241 10/03/19 0405  LATICACIDVEN 1.9 2.4* 2.5*    Recent Results (from the past 240 hour(s))  Culture, blood (Routine X 2) w Reflex to ID Panel     Status: None   Collection Time: 09/27/19 11:39 PM   Specimen: BLOOD  Result Value Ref Range Status   Specimen Description   Final    BLOOD RIGHT ANTECUBITAL Performed at [facility], [address], [place], [place] [address]    Special Requests   Final    BOTTLES DRAWN AEROBIC AND ANAEROBIC Blood Culture adequate volume Performed at [facility], [address], [place], [place] [address]    Culture   Final    NO GROWTH 5 DAYS Performed at [facility], [address], [place], [place] [address]    Report Status 10/03/2019 FINAL  Final  Culture, blood (Routine X 2) w Reflex to ID Panel     Status:  None   Collection Time: 09/27/19 11:39 PM   Specimen: BLOOD  Result Value Ref Range Status   Specimen Description   Final    BLOOD LEFT ANTECUBITAL Performed at [facility], [address], [place], [place] [address]    Special Requests   Final    BOTTLES DRAWN AEROBIC AND ANAEROBIC Blood Culture adequate volume Performed at [facility], [address], [place], [place] [address]    Culture   Final    NO GROWTH 5 DAYS Performed at [facility], [address], [place], [place] [address]    Report Status 10/03/2019 FINAL  Final  Blood culture (routine x 2)     Status: None (Preliminary result)   Collection Time: 10/02/19 10:41 PM   Specimen: BLOOD  Result Value Ref Range Status   Specimen Description BLOOD SITE NOT SPECIFIED  Final   Special Requests   Final    BOTTLES DRAWN AEROBIC AND ANAEROBIC Blood Culture adequate volume   Culture   Final    NO GROWTH < 12 HOURS Performed at [facility], [address], [place], [place] [address]    Report Status PENDING  Incomplete  Respiratory Panel by RT PCR (Flu A&B, Covid) - Nasopharyngeal Swab     Status: None   Collection Time: 10/02/19 10:46 PM   Specimen: Nasopharyngeal Swab  Result Value Ref Range Status   SARS Coronavirus 2 by RT PCR NEGATIVE NEGATIVE Final    Comment: (NOTE) SARS-CoV-2 target nucleic acids are NOT DETECTED. The SARS-CoV-2 RNA is generally detectable in upper respiratoy specimens during the acute phase of infection. The lowest concentration of SARS-CoV-2 viral copies this assay can detect is 131 copies/mL. A negative result does not preclude SARS-Cov-2 infection and should not be used as the sole basis for treatment or other patient management decisions. A negative result may occur with  improper specimen collection/handling, submission of specimen other than nasopharyngeal swab, presence of viral mutation(s) within the areas targeted by this assay, and  inadequate number of viral copies (<131 copies/mL). A negative result must be combined with clinical observations, patient history, and epidemiological information. The expected result is Negative. Fact Sheet for Patients:  https://www.moore.com/ Fact Sheet for Healthcare Providers:  https://www.young.biz/ This test is not yet ap proved or cleared by the [place] FDA and  has been authorized for detection and/or diagnosis of SARS-CoV-2 by FDA under an Emergency Use Authorization (EUA). This EUA will remain  in effect (meaning this test can be used) for the duration of the COVID-19 declaration under Section 564(b)(1) of the Act, 21 U.S.C. section 360bbb-3(b)(1), unless the authorization is terminated or revoked sooner.    Influenza A by PCR NEGATIVE NEGATIVE Final   Influenza B by PCR NEGATIVE NEGATIVE Final    Comment: (NOTE) The Xpert Xpress SARS-CoV-2/FLU/RSV assay is intended as an aid in  the diagnosis of influenza from Nasopharyngeal swab specimens and  should not be used as a sole basis for treatment. Nasal washings and  aspirates are unacceptable for Xpert Xpress SARS-CoV-2/FLU/RSV  testing. Fact Sheet for Patients: https://www.moore.com/ Fact Sheet for Healthcare Providers: https://www.young.biz/ This test is not yet approved or cleared by the [place] FDA and  has been authorized for detection and/or diagnosis of SARS-CoV-2 by  FDA under an Emergency Use Authorization (EUA). This EUA will remain  in effect (meaning this test can be used) for the duration of the  Covid-19 declaration under Section 564(b)(1) of the Act, 21  U.S.C. section 360bbb-3(b)(1), unless the authorization is  terminated or revoked. Performed at [facility], [address], [place], [place] [address]   Blood culture (routine x 2)     Status: None (Preliminary result)   Collection Time: 10/02/19 11:21 PM    Specimen: BLOOD LEFT HAND  Result Value Ref Range Status   Specimen Description BLOOD LEFT HAND  Final   Special Requests   Final    BOTTLES DRAWN AEROBIC AND ANAEROBIC Blood Culture results may not be optimal due to an inadequate volume of blood  received in culture bottles   Culture   Final    NO GROWTH < 12 HOURS Performed at [facility], [address] [place], [place] [address]    Report Status PENDING  Incomplete         Radiology Studies: CT ABDOMEN PELVIS WO CONTRAST  Result Date: 10/03/2019 CLINICAL DATA:  Nominal distension. EXAM: CT ABDOMEN AND PELVIS WITHOUT CONTRAST TECHNIQUE: Multidetector CT imaging of the abdomen and pelvis was performed following the standard protocol without IV contrast. COMPARISON:  Contrast-enhanced CT 5 days ago FINDINGS: Lower chest: No consolidation or pleural fluid. Breathing motion artifact limits detailed assessment. Hepatobiliary: Mild hepatic steatosis without focal lesion. Clips in the gallbladder fossa postcholecystectomy. No biliary dilatation. Pancreas: No ductal dilatation or inflammation. Spleen: Normal in size without focal abnormality. Adrenals/Urinary Tract: Normal adrenal glands. No hydronephrosis or perinephric edema. No renal or ureteral calculi. Urinary bladder is physiologically distended. No bladder wall thickening. Stomach/Bowel: Bowel evaluation limited in the absence of contrast and patient motion. Nondistended stomach. Few prominent fluid-filled small bowel without obstruction or inflammation. Normal appendix. Moderate colonic stool burden without inflammation. Vascular/Lymphatic: Abdominal aorta is normal in caliber. Minor aortic atherosclerosis. Prominent bilateral external iliac and inguinal nodes. Reproductive: Uterus and bilateral adnexa are unremarkable. Other: Previous labial and right perineal soft tissue thickening has improved, however there is patchy soft tissue air in the right perineum, not entirely included in the  field of view. This tracks anteriorly towards the right inguinal region. Heterogeneous stranding and induration of the anterior abdominal wall superficial to the abdominal wall musculature with ill-defined fluid measuring approximately 6.1 by 2.2 cm, series 3, image 73. Lack of contrast limits more detailed evaluation. There is no intra-abdominal free air free fluid. Small fat containing umbilical hernia. Musculoskeletal: Facet hypertrophy in the lower lumbar spine. There are no acute or suspicious osseous abnormalities. IMPRESSION: 1. Soft tissue gas in the right labia and perineum. Labial induration on CT 5 days ago has improved. This area is not entirely included in the field of view. Cannot differentiate soft tissue changes/air from recent incision and drainage versus necrotizing soft tissue infection. 2. There is new subcutaneous induration and inflammatory changes involving the right anterior abdominal wall with ill-defined fluid measuring approximately 6.1 x 2.2 cm. This is suspicious for soft tissue infection with phlegmonous or early abscess changes. Inflammatory changes remains superficial to the anterior abdominal wall. 3. Prominent bilateral external iliac and inguinal nodes are likely reactive. 4. Mild hepatic steatosis. These results were called by telephone at the time of interpretation on 10/03/2019 at 12:57 am to PA ABIGAIL HARRIS , who verbally acknowledged these results. Aortic Atherosclerosis (ICD10-I70.0). Electronically Signed   By: Narda Rutherford M.D.   On: 10/03/2019 00:58   DG Chest Port 1 View  Result Date: 10/02/2019 CLINICAL DATA:  Altered mental status EXAM: PORTABLE CHEST 1 VIEW COMPARISON:  01/16/2013 FINDINGS: Rotated patient. Slight asymmetric increased interstitial opacity left thorax. No consolidation. Normal heart size. Mild convex opacity at the right hilus likely due to vascular structure and patient rotation. No pneumothorax. IMPRESSION: Slight asymmetric interstitial  opacity in the left thorax, question atypical infection, doubt asymmetric edema. Electronically Signed   By: Jasmine Pang M.D.   On: 10/02/2019 22:47        Scheduled Meds: . atorvastatin  10 mg Oral Daily  . enoxaparin (LOVENOX) injection  40 mg Subcutaneous Q24H  . free water  500 mL Per Tube Q4H  . loratadine  10 mg Oral  Daily  . LORazepam       Continuous Infusions: . sodium chloride    . cefTRIAXone (ROCEPHIN)  IV 2 g (10/03/19 0629)  . dextrose 5 % and 0.45% NaCl    . insulin 7 Units/hr (10/03/19 7357)  . lactated ringers    . vancomycin       LOS: 0 days    Time spent: 35 minutes.     Elmarie Shiley, MD [facility]   If 7PM-7AM, please contact night-coverage [email or web address]  10/03/2019, 8:01 AM

## 2019-10-03 NOTE — ED Notes (Signed)
BG 425. Insulin drip titrated to 7Units/hr. Next BGdue at 0720.

## 2019-10-03 NOTE — ED Notes (Signed)
dietary at bedside

## 2019-10-03 NOTE — ED Notes (Signed)
Linen change done, pt had episode of urinary incontinence.

## 2019-10-03 NOTE — Procedures (Signed)
Cortrak  Person Inserting Tube:  Welford Christmas, RD Tube Type:  Cortrak - 43 inches Tube Location:  Right nare Initial Placement:  Stomach Secured by: Bridle Technique Used to Measure Tube Placement:  Documented cm marking at nare/ corner of mouth Cortrak Secured At:  72 cm   No x-ray is required. RN may begin using tube.  If the tube becomes dislodged please keep the tube and contact the Cortrak team at [email or web address] (password [email or web address]) for replacement.  If after hours and replacement cannot be delayed, place a NG tube and confirm placement with an abdominal x-ray.    Howard Patton RD, LDN Clinical Nutrition Pager listed in AMION    

## 2019-10-04 ENCOUNTER — Inpatient Hospital Stay (HOSPITAL_COMMUNITY): Payer: Self-pay | Admitting: Anesthesiology

## 2019-10-04 ENCOUNTER — Inpatient Hospital Stay (HOSPITAL_COMMUNITY): Payer: Self-pay

## 2019-10-04 ENCOUNTER — Encounter (HOSPITAL_COMMUNITY): Payer: Self-pay | Admitting: Pulmonary Disease

## 2019-10-04 ENCOUNTER — Encounter (HOSPITAL_COMMUNITY)
Admission: EM | Disposition: A | Discharge status: Home Health | Payer: Self-pay | Source: Home / Self Care | Attending: Internal Medicine

## 2019-10-04 ENCOUNTER — Encounter (HOSPITAL_COMMUNITY): Payer: Self-pay

## 2019-10-04 DIAGNOSIS — E870 Hyperosmolality and hypernatremia: Secondary | ICD-10-CM

## 2019-10-04 DIAGNOSIS — L02215 Cutaneous abscess of perineum: Secondary | ICD-10-CM

## 2019-10-04 DIAGNOSIS — N179 Acute kidney failure, unspecified: Secondary | ICD-10-CM

## 2019-10-04 DIAGNOSIS — M79604 Pain in right leg: Secondary | ICD-10-CM

## 2019-10-04 HISTORY — PX: APPLICATION OF WOUND VAC: SHX5189

## 2019-10-04 HISTORY — PX: THIGH FASCIOTOMY: SHX6718

## 2019-10-04 HISTORY — PX: THROMBECTOMY FEMORAL ARTERY: SHX6406

## 2019-10-04 LAB — TRIGLYCERIDES: Triglycerides: 255 mg/dL — ABNORMAL HIGH (ref <150)

## 2019-10-04 LAB — BASIC METABOLIC PANEL
Anion gap: 10 (ref 5–15)
Anion gap: 13 (ref 5–15)
Anion gap: 15 (ref 5–15)
Anion gap: 15 (ref 5–15)
BUN: 16 mg/dL (ref 6–20)
BUN: 22 mg/dL — ABNORMAL HIGH (ref 6–20)
BUN: 26 mg/dL — ABNORMAL HIGH (ref 6–20)
BUN: 30 mg/dL — ABNORMAL HIGH (ref 6–20)
CO2: 18 mmol/L — ABNORMAL LOW (ref 22–32)
CO2: 25 mmol/L (ref 22–32)
CO2: 28 mmol/L (ref 22–32)
CO2: 28 mmol/L (ref 22–32)
Calcium: 7.7 mg/dL — ABNORMAL LOW (ref 8.9–10.3)
Calcium: 8.1 mg/dL — ABNORMAL LOW (ref 8.9–10.3)
Calcium: 8.7 mg/dL — ABNORMAL LOW (ref 8.9–10.3)
Calcium: 8.7 mg/dL — ABNORMAL LOW (ref 8.9–10.3)
Chloride: 113 mmol/L — ABNORMAL HIGH (ref 98–111)
Chloride: 120 mmol/L — ABNORMAL HIGH (ref 98–111)
Chloride: 125 mmol/L — ABNORMAL HIGH (ref 98–111)
Chloride: 126 mmol/L — ABNORMAL HIGH (ref 98–111)
Creatinine, Ser: 0.75 mg/dL (ref 0.44–1.00)
Creatinine, Ser: 0.93 mg/dL (ref 0.44–1.00)
Creatinine, Ser: 1.01 mg/dL — ABNORMAL HIGH (ref 0.44–1.00)
Creatinine, Ser: 1.01 mg/dL — ABNORMAL HIGH (ref 0.44–1.00)
GFR calc Af Amer: >60 mL/min (ref >60)
GFR calc Af Amer: >60 mL/min (ref >60)
GFR calc Af Amer: >60 mL/min (ref >60)
GFR calc Af Amer: >60 mL/min (ref >60)
GFR calc non Af Amer: >60 mL/min (ref >60)
GFR calc non Af Amer: >60 mL/min (ref >60)
GFR calc non Af Amer: >60 mL/min (ref >60)
GFR calc non Af Amer: >60 mL/min (ref >60)
Glucose, Bld: 209 mg/dL — ABNORMAL HIGH (ref 70–99)
Glucose, Bld: 214 mg/dL — ABNORMAL HIGH (ref 70–99)
Glucose, Bld: 215 mg/dL — ABNORMAL HIGH (ref 70–99)
Glucose, Bld: 216 mg/dL — ABNORMAL HIGH (ref 70–99)
Potassium: 2.8 mmol/L — ABNORMAL LOW (ref 3.5–5.1)
Potassium: 3.3 mmol/L — ABNORMAL LOW (ref 3.5–5.1)
Potassium: 3.9 mmol/L (ref 3.5–5.1)
Potassium: 4.8 mmol/L (ref 3.5–5.1)
Sodium: 148 mmol/L — ABNORMAL HIGH (ref 135–145)
Sodium: 159 mmol/L — ABNORMAL HIGH (ref 135–145)
Sodium: 163 mmol/L (ref 135–145)
Sodium: 166 mmol/L (ref 135–145)

## 2019-10-04 LAB — HEMOGLOBIN AND HEMATOCRIT, BLOOD
HCT: 37.3 % (ref 36.0–46.0)
Hemoglobin: 11.5 g/dL — ABNORMAL LOW (ref 12.0–15.0)

## 2019-10-04 LAB — PROTIME-INR
INR: 1.5 — ABNORMAL HIGH (ref 0.8–1.2)
Prothrombin Time: 18.3 seconds — ABNORMAL HIGH (ref 11.4–15.2)

## 2019-10-04 LAB — CBC
HCT: 50.2 % — ABNORMAL HIGH (ref 36.0–46.0)
Hemoglobin: 15.6 g/dL — ABNORMAL HIGH (ref 12.0–15.0)
MCH: 30.2 pg (ref 26.0–34.0)
MCHC: 31.1 g/dL (ref 30.0–36.0)
MCV: 97.1 fL (ref 80.0–100.0)
Platelets: 178 10*3/uL (ref 150–400)
RBC: 5.17 MIL/uL — ABNORMAL HIGH (ref 3.87–5.11)
RDW: 14 % (ref 11.5–15.5)
WBC: 18 10*3/uL — ABNORMAL HIGH (ref 4.0–10.5)
nRBC: 0.1 % (ref 0.0–0.2)

## 2019-10-04 LAB — CK
Total CK: 1152 U/L — ABNORMAL HIGH (ref 38–234)
Total CK: 2890 U/L — ABNORMAL HIGH (ref 38–234)
Total CK: 3412 U/L — ABNORMAL HIGH (ref 38–234)

## 2019-10-04 LAB — GLUCOSE, CAPILLARY
Glucose-Capillary: 163 mg/dL — ABNORMAL HIGH (ref 70–99)
Glucose-Capillary: 177 mg/dL — ABNORMAL HIGH (ref 70–99)
Glucose-Capillary: 182 mg/dL — ABNORMAL HIGH (ref 70–99)
Glucose-Capillary: 186 mg/dL — ABNORMAL HIGH (ref 70–99)
Glucose-Capillary: 187 mg/dL — ABNORMAL HIGH (ref 70–99)
Glucose-Capillary: 191 mg/dL — ABNORMAL HIGH (ref 70–99)
Glucose-Capillary: 199 mg/dL — ABNORMAL HIGH (ref 70–99)
Glucose-Capillary: 207 mg/dL — ABNORMAL HIGH (ref 70–99)
Glucose-Capillary: 219 mg/dL — ABNORMAL HIGH (ref 70–99)
Glucose-Capillary: 219 mg/dL — ABNORMAL HIGH (ref 70–99)
Glucose-Capillary: 227 mg/dL — ABNORMAL HIGH (ref 70–99)
Glucose-Capillary: 234 mg/dL — ABNORMAL HIGH (ref 70–99)
Glucose-Capillary: 270 mg/dL — ABNORMAL HIGH (ref 70–99)

## 2019-10-04 LAB — POCT I-STAT 7, (LYTES, BLD GAS, ICA,H+H)
Acid-Base Excess: 1 mmol/L (ref 0.0–2.0)
Bicarbonate: 28.4 mmol/L — ABNORMAL HIGH (ref 20.0–28.0)
Calcium, Ion: 1.18 mmol/L (ref 1.15–1.40)
HCT: 39 % (ref 36.0–46.0)
Hemoglobin: 13.3 g/dL (ref 12.0–15.0)
O2 Saturation: 92 %
Patient temperature: 98.8
Potassium: 3.8 mmol/L (ref 3.5–5.1)
Sodium: 157 mmol/L — ABNORMAL HIGH (ref 135–145)
TCO2: 30 mmol/L (ref 22–32)
pCO2 arterial: 55.6 mmHg — ABNORMAL HIGH (ref 32.0–48.0)
pH, Arterial: 7.318 — ABNORMAL LOW (ref 7.350–7.450)
pO2, Arterial: 73 mmHg — ABNORMAL LOW (ref 83.0–108.0)

## 2019-10-04 LAB — HEPATIC FUNCTION PANEL
ALT: 25 U/L (ref 0–44)
AST: 59 U/L — ABNORMAL HIGH (ref 15–41)
Albumin: 2 g/dL — ABNORMAL LOW (ref 3.5–5.0)
Alkaline Phosphatase: 124 U/L (ref 38–126)
Bilirubin, Direct: 0.4 mg/dL — ABNORMAL HIGH (ref 0.0–0.2)
Indirect Bilirubin: 0.4 mg/dL (ref 0.3–0.9)
Total Bilirubin: 0.8 mg/dL (ref 0.3–1.2)
Total Protein: 5.6 g/dL — ABNORMAL LOW (ref 6.5–8.1)

## 2019-10-04 LAB — MAGNESIUM: Magnesium: 2.1 mg/dL (ref 1.7–2.4)

## 2019-10-04 LAB — URINE CULTURE: Culture: >=100000 — AB

## 2019-10-04 LAB — LACTIC ACID, PLASMA: Lactic Acid, Venous: 2.6 mmol/L (ref 0.5–1.9)

## 2019-10-04 LAB — HEMOGLOBIN A1C
Hgb A1c MFr Bld: 15.2 % — ABNORMAL HIGH (ref 4.8–5.6)
Mean Plasma Glucose: 389.54 mg/dL

## 2019-10-04 SURGERY — THROMBECTOMY, ARTERY, FEMORAL
Anesthesia: General | Site: Leg Lower | Laterality: Right

## 2019-10-04 MED ORDER — MIDAZOLAM HCL 2 MG/2ML IJ SOLN
INTRAMUSCULAR | Status: AC
  Filled 2019-10-04: qty 2

## 2019-10-04 MED ORDER — TRAMADOL HCL 50 MG PO TABS
25.0000 mg | ORAL_TABLET | Freq: Four times a day (QID) | ORAL | Status: DC | PRN
  Administered 2019-10-04: 25 mg
  Filled 2019-10-04: qty 1

## 2019-10-04 MED ORDER — MIDAZOLAM HCL 5 MG/5ML IJ SOLN
INTRAMUSCULAR | Status: DC | PRN
  Administered 2019-10-04: 2 mg via INTRAVENOUS

## 2019-10-04 MED ORDER — ORAL CARE MOUTH RINSE
15.0000 mL | OROMUCOSAL | Status: DC
  Administered 2019-10-04 – 2019-10-05 (×10): 15 mL via OROMUCOSAL

## 2019-10-04 MED ORDER — ROCURONIUM BROMIDE 10 MG/ML (PF) SYRINGE
PREFILLED_SYRINGE | INTRAVENOUS | Status: DC | PRN
  Administered 2019-10-04: 40 mg via INTRAVENOUS

## 2019-10-04 MED ORDER — HEPARIN SODIUM (PORCINE) 1000 UNIT/ML IJ SOLN
INTRAMUSCULAR | Status: AC
  Filled 2019-10-04: qty 1

## 2019-10-04 MED ORDER — CHLORHEXIDINE GLUCONATE 0.12% ORAL RINSE (MEDLINE KIT)
15.0000 mL | Freq: Two times a day (BID) | OROMUCOSAL | Status: DC
  Administered 2019-10-04 – 2019-10-05 (×2): 15 mL via OROMUCOSAL

## 2019-10-04 MED ORDER — ONDANSETRON HCL 4 MG/2ML IJ SOLN
INTRAMUSCULAR | Status: AC
  Filled 2019-10-04: qty 2

## 2019-10-04 MED ORDER — FENTANYL 2500MCG IN NS 250ML (10MCG/ML) PREMIX INFUSION
0.0000 ug/h | INTRAVENOUS | Status: DC
  Administered 2019-10-04: 50 ug/h via INTRAVENOUS
  Filled 2019-10-04: qty 250

## 2019-10-04 MED ORDER — IPRATROPIUM-ALBUTEROL 0.5-2.5 (3) MG/3ML IN SOLN
RESPIRATORY_TRACT | Status: AC
  Filled 2019-10-04: qty 3

## 2019-10-04 MED ORDER — HEPARIN (PORCINE) 25000 UT/250ML-% IV SOLN
500.0000 [IU]/h | INTRAVENOUS | Status: DC
  Administered 2019-10-04: 500 [IU]/h via INTRAVENOUS
  Filled 2019-10-04: qty 250

## 2019-10-04 MED ORDER — PHENYLEPHRINE 40 MCG/ML (10ML) SYRINGE FOR IV PUSH (FOR BLOOD PRESSURE SUPPORT)
PREFILLED_SYRINGE | INTRAVENOUS | Status: AC
  Filled 2019-10-04: qty 10

## 2019-10-04 MED ORDER — 0.9 % SODIUM CHLORIDE (POUR BTL) OPTIME
TOPICAL | Status: DC | PRN
  Administered 2019-10-04: 1000 mL

## 2019-10-04 MED ORDER — FENTANYL CITRATE (PF) 100 MCG/2ML IJ SOLN
INTRAMUSCULAR | Status: AC
  Filled 2019-10-04: qty 2

## 2019-10-04 MED ORDER — FENTANYL CITRATE (PF) 100 MCG/2ML IJ SOLN
100.0000 ug | Freq: Once | INTRAMUSCULAR | Status: AC
  Administered 2019-10-04: 100 ug via INTRAVENOUS

## 2019-10-04 MED ORDER — INSULIN REGULAR(HUMAN) IN NACL 100-0.9 UT/100ML-% IV SOLN
INTRAVENOUS | Status: DC
  Administered 2019-10-04: 4.4 [IU]/h via INTRAVENOUS
  Filled 2019-10-04 (×2): qty 100

## 2019-10-04 MED ORDER — INSULIN ASPART 100 UNIT/ML ~~LOC~~ SOLN: 0.0000 [IU] | SUBCUTANEOUS | Status: DC

## 2019-10-04 MED ORDER — INSULIN DETEMIR 100 UNIT/ML ~~LOC~~ SOLN
20.0000 [IU] | Freq: Two times a day (BID) | SUBCUTANEOUS | Status: DC
  Administered 2019-10-05 (×3): 20 [IU] via SUBCUTANEOUS
  Filled 2019-10-04 (×6): qty 0.2

## 2019-10-04 MED ORDER — HEPARIN (PORCINE) 25000 UT/250ML-% IV SOLN
1100.0000 [IU]/h | INTRAVENOUS | Status: DC
  Administered 2019-10-04: 1100 [IU]/h via INTRAVENOUS
  Filled 2019-10-04: qty 250

## 2019-10-04 MED ORDER — IPRATROPIUM-ALBUTEROL 0.5-2.5 (3) MG/3ML IN SOLN
3.0000 mL | Freq: Three times a day (TID) | RESPIRATORY_TRACT | Status: DC
  Administered 2019-10-04 – 2019-10-05 (×4): 3 mL via RESPIRATORY_TRACT
  Filled 2019-10-04 (×4): qty 3

## 2019-10-04 MED ORDER — SODIUM CHLORIDE 0.9 % IV SOLN
INTRAVENOUS | Status: DC | PRN
  Administered 2019-10-04 – 2019-10-07 (×2): 250 mL via INTRAVENOUS

## 2019-10-04 MED ORDER — ROCURONIUM BROMIDE 50 MG/5ML IV SOLN
100.0000 mg | Freq: Once | INTRAVENOUS | Status: AC
  Administered 2019-10-04: 100 mg via INTRAVENOUS
  Filled 2019-10-04: qty 10

## 2019-10-04 MED ORDER — SODIUM CHLORIDE 0.45 % IV SOLN: INTRAVENOUS | Status: DC | PRN

## 2019-10-04 MED ORDER — FENTANYL CITRATE (PF) 100 MCG/2ML IJ SOLN
50.0000 ug | INTRAMUSCULAR | Status: DC | PRN
  Administered 2019-10-04 – 2019-10-06 (×6): 50 ug via INTRAVENOUS
  Filled 2019-10-04 (×6): qty 2

## 2019-10-04 MED ORDER — SODIUM CHLORIDE 0.9 % IV SOLN
INTRAVENOUS | Status: AC
  Filled 2019-10-04: qty 1.2

## 2019-10-04 MED ORDER — ETOMIDATE 2 MG/ML IV SOLN
20.0000 mg | Freq: Once | INTRAVENOUS | Status: AC
  Administered 2019-10-04: 20 mg via INTRAVENOUS

## 2019-10-04 MED ORDER — ALBUMIN HUMAN 25 % IV SOLN
50.0000 g | Freq: Once | INTRAVENOUS | Status: AC
  Administered 2019-10-04: 50 g via INTRAVENOUS
  Filled 2019-10-04: qty 100

## 2019-10-04 MED ORDER — HEPARIN SODIUM (PORCINE) 1000 UNIT/ML IJ SOLN
INTRAMUSCULAR | Status: DC | PRN
  Administered 2019-10-04: 5000 [IU] via INTRAVENOUS
  Administered 2019-10-04: 3000 [IU] via INTRAVENOUS

## 2019-10-04 MED ORDER — ONDANSETRON HCL 4 MG/2ML IJ SOLN
INTRAMUSCULAR | Status: DC | PRN
  Administered 2019-10-04: 4 mg via INTRAVENOUS

## 2019-10-04 MED ORDER — MIDAZOLAM HCL 2 MG/2ML IJ SOLN
2.0000 mg | Freq: Once | INTRAMUSCULAR | Status: AC
  Administered 2019-10-04: 2 mg via INTRAVENOUS

## 2019-10-04 MED ORDER — POTASSIUM CHLORIDE 20 MEQ/15ML (10%) PO SOLN
20.0000 meq | Freq: Once | ORAL | Status: AC
  Administered 2019-10-04: 20 meq
  Filled 2019-10-04: qty 15

## 2019-10-04 MED ORDER — FREE WATER
300.0000 mL | Status: DC
  Administered 2019-10-04 – 2019-10-05 (×22): 300 mL

## 2019-10-04 MED ORDER — HEPARIN BOLUS VIA INFUSION
4000.0000 [IU] | Freq: Once | INTRAVENOUS | Status: AC
  Administered 2019-10-04: 4000 [IU] via INTRAVENOUS
  Filled 2019-10-04: qty 4000

## 2019-10-04 MED ORDER — INSULIN ASPART 100 UNIT/ML ~~LOC~~ SOLN
2.0000 [IU] | SUBCUTANEOUS | Status: DC
  Administered 2019-10-05: 6 [IU] via SUBCUTANEOUS
  Administered 2019-10-05: 4 [IU] via SUBCUTANEOUS
  Administered 2019-10-05 (×2): 6 [IU] via SUBCUTANEOUS
  Administered 2019-10-05: 4 [IU] via SUBCUTANEOUS
  Administered 2019-10-05: 6 [IU] via SUBCUTANEOUS
  Administered 2019-10-06: 4 [IU] via SUBCUTANEOUS
  Administered 2019-10-06: 2 [IU] via SUBCUTANEOUS

## 2019-10-04 MED ORDER — NOREPINEPHRINE 4 MG/250ML-% IV SOLN
0.0000 ug/min | INTRAVENOUS | Status: DC
  Administered 2019-10-04: 2 ug/min via INTRAVENOUS
  Administered 2019-10-05: 04:00:00 8 ug/min via INTRAVENOUS
  Filled 2019-10-04 (×2): qty 250

## 2019-10-04 MED ORDER — PROPOFOL 1000 MG/100ML IV EMUL
5.0000 ug/kg/min | INTRAVENOUS | Status: DC
  Administered 2019-10-04: 35 ug/kg/min via INTRAVENOUS
  Administered 2019-10-04: 50 ug/kg/min via INTRAVENOUS
  Administered 2019-10-04 – 2019-10-05 (×3): 35 ug/kg/min via INTRAVENOUS
  Filled 2019-10-04 (×6): qty 100

## 2019-10-04 MED ORDER — POTASSIUM CHLORIDE 10 MEQ/50ML IV SOLN: 10.0000 meq | INTRAVENOUS | Status: DC

## 2019-10-04 MED ORDER — SODIUM CHLORIDE 0.9 % IV SOLN
INTRAVENOUS | Status: DC | PRN
  Administered 2019-10-04: 500 mL

## 2019-10-04 MED ORDER — POTASSIUM CHLORIDE CRYS ER 20 MEQ PO TBCR
20.0000 meq | EXTENDED_RELEASE_TABLET | Freq: Once | ORAL | Status: DC
  Filled 2019-10-04: qty 1

## 2019-10-04 MED ORDER — FENTANYL CITRATE (PF) 250 MCG/5ML IJ SOLN
INTRAMUSCULAR | Status: AC
  Filled 2019-10-04: qty 5

## 2019-10-04 MED ORDER — PHENYLEPHRINE 40 MCG/ML (10ML) SYRINGE FOR IV PUSH (FOR BLOOD PRESSURE SUPPORT)
PREFILLED_SYRINGE | INTRAVENOUS | Status: DC | PRN
  Administered 2019-10-04 (×2): 80 ug via INTRAVENOUS

## 2019-10-04 MED ORDER — ACETAMINOPHEN 160 MG/5ML PO SOLN: 650.0000 mg | ORAL | Status: DC | PRN

## 2019-10-04 MED ORDER — DEXTROSE 5 % IV SOLN: INTRAVENOUS | Status: DC

## 2019-10-04 SURGICAL SUPPLY — 63 items
BANDAGE ESMARK 6X9 LF (GAUZE/BANDAGES/DRESSINGS) IMPLANT
BNDG ESMARK 6X9 LF (GAUZE/BANDAGES/DRESSINGS)
CANISTER SUCT 3000ML PPV (MISCELLANEOUS) ×2 IMPLANT
CANISTER WOUNDNEG PRESSURE 500 (CANNISTER) ×2 IMPLANT
CATH EMB 3FR 80CM (CATHETERS) ×2 IMPLANT
CATH EMB 4FR 80CM (CATHETERS) ×2 IMPLANT
CLIP VESOCCLUDE MED 24/CT (CLIP) ×2 IMPLANT
CLIP VESOCCLUDE SM WIDE 24/CT (CLIP) ×2 IMPLANT
COVER PROBE W GEL 5X96 (DRAPES) ×2 IMPLANT
COVER SURGICAL LIGHT HANDLE (MISCELLANEOUS) ×2 IMPLANT
COVER WAND RF STERILE (DRAPES) ×2 IMPLANT
CUFF TOURN SGL QUICK 24 (TOURNIQUET CUFF)
CUFF TOURN SGL QUICK 34 (TOURNIQUET CUFF)
CUFF TOURN SGL QUICK 42 (TOURNIQUET CUFF) IMPLANT
CUFF TRNQT CYL 24X4X16.5-23 (TOURNIQUET CUFF) IMPLANT
CUFF TRNQT CYL 34X4.125X (TOURNIQUET CUFF) IMPLANT
DERMABOND ADVANCED (GAUZE/BANDAGES/DRESSINGS) ×1
DERMABOND ADVANCED .7 DNX12 (GAUZE/BANDAGES/DRESSINGS) ×1 IMPLANT
DRAIN CHANNEL 15F RND FF W/TCR (WOUND CARE) IMPLANT
DRAPE C-ARM 42X72 X-RAY (DRAPES) ×2 IMPLANT
DRAPE INCISE IOBAN 66X45 STRL (DRAPES) ×2 IMPLANT
DRSG ADAPTIC 3X8 NADH LF (GAUZE/BANDAGES/DRESSINGS) ×2 IMPLANT
DRSG VAC ATS LRG SENSATRAC (GAUZE/BANDAGES/DRESSINGS) ×2 IMPLANT
ELECT REM PT RETURN 9FT ADLT (ELECTROSURGICAL) ×2
ELECTRODE REM PT RTRN 9FT ADLT (ELECTROSURGICAL) ×1 IMPLANT
EVACUATOR SILICONE 100CC (DRAIN) IMPLANT
GLOVE BIO SURGEON STRL SZ7.5 (GLOVE) ×2 IMPLANT
GLOVE BIOGEL PI IND STRL 8 (GLOVE) ×1 IMPLANT
GLOVE BIOGEL PI INDICATOR 8 (GLOVE) ×1
GOWN STRL REUS W/ TWL LRG LVL3 (GOWN DISPOSABLE) ×2 IMPLANT
GOWN STRL REUS W/ TWL XL LVL3 (GOWN DISPOSABLE) ×2 IMPLANT
GOWN STRL REUS W/TWL LRG LVL3 (GOWN DISPOSABLE) ×2
GOWN STRL REUS W/TWL XL LVL3 (GOWN DISPOSABLE) ×2
HEMOSTAT SPONGE AVITENE ULTRA (HEMOSTASIS) IMPLANT
INSERT FOGARTY SM (MISCELLANEOUS) IMPLANT
KIT BASIN OR (CUSTOM PROCEDURE TRAY) ×2 IMPLANT
KIT TURNOVER KIT B (KITS) ×2 IMPLANT
NS IRRIG 1000ML POUR BTL (IV SOLUTION) ×4 IMPLANT
PACK PERIPHERAL VASCULAR (CUSTOM PROCEDURE TRAY) ×2 IMPLANT
PAD ARMBOARD 7.5X6 YLW CONV (MISCELLANEOUS) ×4 IMPLANT
PENCIL BUTTON HOLSTER BLD 10FT (ELECTRODE) ×2 IMPLANT
STAPLER VISISTAT 35W (STAPLE) IMPLANT
STOPCOCK 4 WAY LG BORE MALE ST (IV SETS) IMPLANT
SUT ETHILON 3 0 PS 1 (SUTURE) IMPLANT
SUT GORETEX 5 0 TT13 24 (SUTURE) IMPLANT
SUT GORETEX 6.0 TT13 (SUTURE) IMPLANT
SUT MNCRL AB 4-0 PS2 18 (SUTURE) ×6 IMPLANT
SUT PROLENE 5 0 C 1 24 (SUTURE) ×4 IMPLANT
SUT PROLENE 6 0 BV (SUTURE) ×2 IMPLANT
SUT PROLENE 7 0 BV 1 (SUTURE) IMPLANT
SUT SILK 2 0 PERMA HAND 18 BK (SUTURE) IMPLANT
SUT SILK 3 0 (SUTURE)
SUT SILK 3-0 18XBRD TIE 12 (SUTURE) IMPLANT
SUT VIC AB 2-0 CT1 27 (SUTURE) ×3
SUT VIC AB 2-0 CT1 TAPERPNT 27 (SUTURE) ×3 IMPLANT
SUT VIC AB 3-0 SH 27 (SUTURE) ×4
SUT VIC AB 3-0 SH 27X BRD (SUTURE) ×4 IMPLANT
SYR 3ML LL SCALE MARK (SYRINGE) ×2 IMPLANT
TOWEL GREEN STERILE (TOWEL DISPOSABLE) ×2 IMPLANT
TRAY FOLEY MTR SLVR 16FR STAT (SET/KITS/TRAYS/PACK) ×2 IMPLANT
TUBING EXTENTION W/L.L. (IV SETS) IMPLANT
UNDERPAD 30X30 (UNDERPADS AND DIAPERS) ×2 IMPLANT
WATER STERILE IRR 1000ML POUR (IV SOLUTION) ×2 IMPLANT

## 2019-10-04 NOTE — Progress Notes (Signed)
Too agitated and confused for CT. Intubated for airway protection. Per vascular, to go straight to OR.  Myrla Halsted MD PCCM

## 2019-10-04 NOTE — Progress Notes (Signed)
eLink Physician-Brief Progress Note Patient Name: [name] DOB: Nov 02, 1965 MRN: [id number]   Date of Service  10/04/2019  HPI/Events of Note  Serum sodium improved at 163, K+ 3.3  eICU Interventions  Elink electrolyte replacement protocol ordered. Continue current free water admin rate.        Thomasene Lot [name] 10/04/2019, 5:50 AM

## 2019-10-04 NOTE — Consult Note (Addendum)
Hospital Consult    Reason for Consult:  Right leg ischemia Requesting Physician:  ICU/CCM MRN #:  [id number]  History of Present Illness: This is a 54 y.o. female who presented to hospital yesterday with hypernatremia most likely due to dehydration.  She started c/o right leg pain this morning and unable to get doppler signals.  Vascular surgery is consulted.    The pt is on a statin for cholesterol management.  The pt is not on a daily aspirin.   Other AC:  Heparin gtt to start The pt is on ACEI for hypertension.   The pt is diabetic.   Tobacco hx:  current  Past Medical History:  Diagnosis Date  . Allergy   . Anxiety   . Asthma   . Depression   . Diabetes mellitus without complication [facility])     Past Surgical History:  Procedure Laterality Date  . CHOLECYSTECTOMY    . TUBAL LIGATION      Allergies  Allergen Reactions  . Codeine   . Guaifenesin Hives    Prior to Admission medications   Medication Sig Start Date End Date Taking? Authorizing Provider  clindamycin (CLEOCIN) 150 MG capsule Take 2 capsules (300 mg total) by mouth 4 (four) times daily. 09/28/19  Yes Pollina, Gwenyth Allegra, MD  EPINEPHrine (PRIMATENE MIST) 0.125 MG/ACT AERO Inhale 2 puffs into the lungs every 4 (four) hours as needed (BREATHING ISSUES).   Yes [provider]  albuterol (VENTOLIN HFA) 108 (90 Base) MCG/ACT inhaler INHALE TWO PUFFS BY MOUTH 4 TIMES DAILY AS NEEDED.  Pt prefers ventolin brand Patient not taking: Reported on 10/02/2019 09/26/16   Tenna Delaine D, PA-C  atorvastatin (LIPITOR) 10 MG tablet Take 1 tablet (10 mg total) by mouth daily. Patient not taking: Reported on 10/02/2019 11/23/15   Leandrew Koyanagi, MD  beclomethasone (QVAR) 40 MCG/ACT inhaler Inhale 1 puff into the lungs 2 (two) times daily. Patient not taking: Reported on 10/02/2019 09/26/16   Tenna Delaine D, PA-C  blood glucose meter kit and supplies KIT Dispense based on patient and insurance preference. Use  up to four times daily as directed. (FOR ICD-9 250.00, 250.01). 04/02/15   McVeigh, Sherren Mocha, PA  HYDROcodone-homatropine (HYCODAN) 5-1.5 MG/5ML syrup Take 5 mLs by mouth every 8 (eight) hours as needed for cough. Please make sure this does not have guaifenesin in it please Patient not taking: Reported on 10/02/2019 12/08/15   Gale Journey, Damaris Hippo, PA-C  lisinopril (PRINIVIL,ZESTRIL) 2.5 MG tablet Take 1 tablet (2.5 mg total) by mouth daily. Patient not taking: Reported on 10/02/2019 11/23/15   Leandrew Koyanagi, MD  loratadine (CLARITIN) 10 MG tablet Take 10 mg by mouth daily.    [provider]  metFORMIN (GLUCOPHAGE) 500 MG tablet Take 2 tablets (1,000 mg total) by mouth 2 (two) times daily with a meal. Patient not taking: Reported on 10/02/2019 11/23/15   Leandrew Koyanagi, MD  montelukast (SINGULAIR) 10 MG tablet Take 1 tablet (10 mg total) by mouth at bedtime. Patient not taking: Reported on 10/02/2019 12/31/14   Copland, Gay Filler, MD    Social History   Socioeconomic History  . Marital status: Married    Spouse name: Not on file  . Number of children: Not on file  . Years of education: Not on file  . Highest education level: Not on file  Occupational History  . Not on file  Tobacco Use  . Smoking status: Current Every Day Smoker    Packs/day: 1.00  Years: 34.00    Pack years: 34.00    Types: Cigarettes  . Smokeless tobacco: Never Used  Substance and Sexual Activity  . Alcohol use: No    Alcohol/week: 0.0 standard drinks  . Drug use: No  . Sexual activity: Not on file  Other Topics Concern  . Not on file  Social History Narrative  . Not on file   Social Determinants of Health   Financial Resource Strain:   . Difficulty of Paying Living Expenses: Not on file  Food Insecurity:   . Worried About Charity fundraiser in the Last Year: Not on file  . Ran Out of Food in the Last Year: Not on file  Transportation Needs:   . Lack of Transportation (Medical): Not on file  .  Lack of Transportation (Non-Medical): Not on file  Physical Activity:   . Days of Exercise per Week: Not on file  . Minutes of Exercise per Session: Not on file  Stress:   . Feeling of Stress : Not on file  Social Connections:   . Frequency of Communication with Friends and Family: Not on file  . Frequency of Social Gatherings with Friends and Family: Not on file  . Attends Religious Services: Not on file  . Active Member of Clubs or Organizations: Not on file  . Attends Archivist Meetings: Not on file  . Marital Status: Not on file  Intimate Partner Violence:   . Fear of Current or Ex-Partner: Not on file  . Emotionally Abused: Not on file  . Physically Abused: Not on file  . Sexually Abused: Not on file     Family History  Problem Relation Age of Onset  . Stroke Mother   . Heart disease Father     ROS: '[x]'  Positive   '[ ]'  Negative   '[ ]'  All sytems reviewed and are negative Pt has right leg pain.  Unable to assess otherwise.   Physical Examination  Vitals:   10/04/19 0728 10/04/19 0820  BP:  (!) 148/76  Pulse:  85  Resp:    Temp: 98.5 F (36.9 C)   SpO2:  93%   Body mass index is 37.4 kg/m.  General:  WDWN in moderate distress Gait: Not observed HENT: WNL, normocephalic Pulmonary: normal non-labored breathing Cardiac: regular Abdomen:  soft, NT/ND, no masses Skin: without rashes Vascular Exam/Pulses:  Right Left  Radial 2+ (normal) 2+ (normal)  Femoral Monophasic doppler signal 2+ (normal)  Popliteal Unable to get doppler signal Not palpated  DP absent absent  PT absent +doppler signal  Peroneal absent Not examined   Extremities: right leg cool and mottled.  Motor and sensation are not in tact.  Left leg warm and motor and sensation are in tact.    Musculoskeletal: no muscle wasting or atrophy  Neurologic: alert to place  CBC    Component Value Date/Time   WBC 18.0 (H) 10/04/2019 0428   RBC 5.17 (H) 10/04/2019 0428   HGB 15.6 (H)  10/04/2019 0428   HCT 50.2 (H) 10/04/2019 0428   PLT 178 10/04/2019 0428   MCV 97.1 10/04/2019 0428   MCV 89.0 04/02/2015 1507   MCH 30.2 10/04/2019 0428   MCHC 31.1 10/04/2019 0428   RDW 14.0 10/04/2019 0428   LYMPHSABS 2.6 10/03/2019 0405   MONOABS 2.0 (H) 10/03/2019 0405   EOSABS 0.0 10/03/2019 0405   BASOSABS 0.0 10/03/2019 0405    BMET    Component Value Date/Time   NA 163 (  HH) 10/04/2019 0428   K 3.3 (L) 10/04/2019 0428   CL 120 (H) 10/04/2019 0428   CO2 28 10/04/2019 0428   GLUCOSE 215 (H) 10/04/2019 0428   BUN 26 (H) 10/04/2019 0428   CREATININE 0.93 10/04/2019 0428   CREATININE 0.81 04/02/2015 1452   CALCIUM 8.7 (L) 10/04/2019 0428   GFRNONAA >60 10/04/2019 0428   GFRAA >60 10/04/2019 0428    COAGS: Lab Results  Component Value Date   INR 1.5 (H) 10/04/2019   INR 0.99 09/30/2009     Non-Invasive Vascular Imaging:   CTA abdomen/pelvis with runoff ordered stat   ASSESSMENT/PLAN: This is a 54 y.o. female with RLE ischemia  -plan for stat CTA and OR for revascularization -heparin gtt ordered -Dr. Trula Slade speaking to family via telephone   Leontine Locket, PA-C [facility] [phone]   I agree with the above.  I have seen and evaluated patient.  This is a 54 year old female that we were just consulted for for evaluation of an ischemic right leg.  According to the nursing staff, this began around 7 AM.  The right leg is cold.  She does not have motor or sensory function.  No Doppler signals are present in the foot or the popliteal space.  There was a faint monophasic right femoral Doppler signal.  She had a brisk left posterior tibial Doppler signal.  The patient was admitted on 10/02/2019 for sepsis.  She has a perineal abscess that was drained in the emergency department.  She has been confused and is being treated for DKA as well as hypernatremia.  I discussed with the patient's son as well as the patient that she has an acutely  ischemic right leg.  She has profound motor and sensory deficits.  I discussed that she is at risk for limb loss.  Because of her labial abscess, I am ordering a CT angiogram to better define her anatomy with plans for urgent exploration this morning.  She will likely require fasciotomies.  [name]

## 2019-10-04 NOTE — Progress Notes (Signed)
CRITICAL VALUE ALERT  Critical Value:  Na 163  Date & Time Notied:  10/04/2019 @ 05:38  Provider Notified: Lona Kettle.  Orders Received/Actions taken: TBD

## 2019-10-04 NOTE — Progress Notes (Signed)
eLink Physician-Brief Progress Note Patient Name: MILY MALECKI DOB: 03-19-1966 MRN: [id number]   Date of Service  10/04/2019  HPI/Events of Note  Leg and abdominal pain not fully relieved by Tylenol.  eICU Interventions  Ultram 25 mg Q 6 hours prn,        Ednah Hammock U Miata Culbreth 10/04/2019, 5:24 AM

## 2019-10-04 NOTE — Op Note (Signed)
Patient name: [name] MRN: [id number] DOB: [birth year] Sex: female  10/04/2019 Pre-operative Diagnosis: Acute right lower extremity ischemia Post-operative diagnosis:  Same Surgeon:  Erlene Quan C. Donzetta Matters, MD Assistant: Annamarie Major, MD; Laurence Slate, PA Procedure Performed: 1.  Exposure right common femoral, sfa and profunda femoris arteries 2.  Right lower extremity thromboembolectomy 3.  Exposure right popliteal and anterior tibial, peroneal, posterior tibial arteries 4.  4 compartment fasciotomies right lower extremity 5.  Placement of negative pressure dressing to 4 compartment fasciotomies.  Indications: 54 year old female admitted with perineal abscess.  She was found to have acute right lower extremity ischemia with pain.  She subsequently indicated for right lower extremity thromboembolectomy.  CT scan had been performed the day prior which did not demonstrate any aortic or iliac lesions.  Findings: There was acute appearing thrombus with plug removed from the common femoral artery proximal to the external and common iliac arteries.  Distally we were able to pass a 3 Fogarty to the hub did return clot on one pass.  We also able to embolectomized the profunda femoris.  Unfortunately we had weak signals after this.  We performed 4 compartment fasciotomies.  We still had weak signals at the anterior tibial and posterior tibial arteries.  We then exposed the below-knee popliteal artery.  There was a high takeoff of the posterior tibial artery.  We individually embolectomized the anterior tibial and peroneal arteries.  I elected not to embolectomized the posterior tibial given its small size.  At completion we had a good signal at the anterior tibial and peroneal arteries at the ankle.   Procedure:  The patient was identified in the holding area and taken to the operating room where she is placed supine operative table.  She had previously been intubated.  She was on 1100 units of heparin  per hour which was stopped at the time of arrival.  Timeout was called.  We began with transverse incision in the groin.  We dissected down to the common femoral artery.  Placed Vesseloops around the common femoral as well as profunda and SFA.  An additional 5000 units of heparin was administered.  A transverse arteriotomy was made to level the profunda.  We passed a 4 Fogarty proximally retrieve significant thrombus on one pass.  We had brisk inflow.  I then passed 4 Fogarty down the profunda to approximately 8 cm retrieved good clot.  We then passed a 3 Fogarty all the way to its hub.  We retrieved clot on one pass but then was clean.  I irrigated the arteriotomy with heparinized saline and closed with 5-0 Prolene suture.  Unfortunately signals at the ankle were weak.  I then performed 4 compartment fasciotomies.  I started laterally made longitudinal incision.  I opened both the anterior and lateral compartments.  I then opened the posterior compartment to be a long longitudinal incision.  I took down the soleus bridge open the posterior deep compartment.  This time I still had weak anterior tibial and posterior tibial signals.  With this I elected to expose the below-knee popliteal artery.  We found the posterior tibial artery which was quite superficial.  I dissected deeper identified the popliteal artery.  I divided the anterior tibial vein.  Unfortunately this had to be oversewn as well with 5-0 Prolene suture.  Additional 3000 use of heparin was given.  Placed Vesseloops around the anterior tibial was likely the peroneal artery as well as the popliteal.  Transverse arteriotomy was  made.  I passed a 3 Fogarty all the way to the foot through the anterior tibial peroneal arteries.  I did not return any clot.  I passed proximally up the popliteal.  I again did not return any clot.  I clamped the arteries and closed with 6-0 Prolene suture.  Upon completion I had good signal in the anterior tibial and peroneal.   I had a very weak signal at the posterior tibial artery.  We then fashioned a wound VAC placed this on the both the medial and lateral incisions.  The groin incision was closed with 2-0 Vicryl followed by 4-0 Vicryl at the level of the skin.  Patient remained intubated with plans to transfer to the ICU.  EBL: 250cc   Maanasa Aderhold C. Randie Heinz, MD [facility] [place] Office: [phone] Pager: [phone]

## 2019-10-04 NOTE — Progress Notes (Signed)
   I evaluated patient who was just intubated.  Right lower extremity is profoundly ischemic consistent with Dr. Myra Gianotti with recent evaluation.  We are going to forego the CT scan.  I reviewed her CT scan from yesterday did not demonstrate any apparent arterial thrombus or inciting lesion.  I discussed proceeding with right lower extremity thrombectomy and likely fasciotomies with her sister Charmin Deyette at [phone].  Jamiah Recore C. Randie Heinz, MD [facility] [place] Office: [phone] Pager: [phone]

## 2019-10-04 NOTE — Progress Notes (Signed)
ANTICOAGULATION CONSULT NOTE - Initial Consult  Pharmacy Consult for heparin  Indication: DVT  Allergies  Allergen Reactions  . Codeine   . Guaifenesin Hives    Patient Measurements: Weight: 231 lb 11.3 oz (105.1 kg) Heparin Dosing Weight: 83kg  Vital Signs: Temp: 98.5 F (36.9 C) (02/17 0728) Temp Source: Oral (02/17 0358) BP: 148/76 (02/17 0820) Pulse Rate: 85 (02/17 0820)  Labs: Recent Labs    10/02/19 2241 10/02/19 2253 10/03/19 0405 10/03/19 0405 10/03/19 0430 10/03/19 0430 10/03/19 0728 10/03/19 1255 10/03/19 2004 10/04/19 0009 10/04/19 0428  HGB 18.2*   < > 16.4*   < > 16.4*   < > 19.0*  --   --   --  15.6*  HCT 57.5*   < > 52.2*   < > 52.1*  --  56.0*  --   --   --  50.2*  PLT 321   < > 269  --  267  --   --   --   --   --  178  LABPROT  --   --   --   --   --   --   --   --   --   --  18.3*  INR  --   --   --   --   --   --   --   --   --   --  1.5*  CREATININE 2.08*   < > 1.68*   < > 1.26*  --   --    < > 1.16* 1.01* 0.93  CKTOTAL 19*  --   --   --   --   --   --   --   --   --   --   TROPONINIHS 22*  --  24*  --   --   --   --   --   --   --   --    < > = values in this interval not displayed.    Estimated Creatinine Clearance: 84.7 mL/min (by C-G formula based on SCr of 0.93 mg/dL).   Medical History: Past Medical History:  Diagnosis Date  . Allergy   . Anxiety   . Asthma   . Depression   . Diabetes mellitus without complication [facility])      Assessment: Patient admitted in the ICU for DKA, gap now closed. She also has a perineal abscess being treated conservatively with abx no indication for surgery at this time. Complaining of severe leg pain this morning and unable to get doppler signals, concerning for DVT. Planning stat CTA and OR revascularization will initiate heparin drip now.  Goal of Therapy:  Heparin level 0.3-0.7 units/ml Monitor platelets by anticoagulation protocol: Yes   Plan:  Heparin 4000 unit bolus x1  Heparin 1,100  units/hr 6 hour heparin level Daily Heparin level and CBC  [name] 10/04/2019,8:39 AM

## 2019-10-04 NOTE — Progress Notes (Signed)
  Day of Surgery Note    Subjective:  Intubated and sedated   Vitals:   10/04/19 1330 10/04/19 1345  BP: (!) 95/47 (!) 93/51  Pulse: 82 80  Resp: (!) 22 (!) 23  Temp:    SpO2: 93% 94%    Incisions:   Right groin is clean and dry Extremities:  + doppler signals right AT/PT/peroneal; fasciotomy sites with wound vacs in place with good seal.  Cardiac:  regular Lungs:  intubated    Assessment/Plan:  This is a 54 y.o. female who is s/p  1.  Exposure right common femoral, sfa and profunda femoris arteries 2.  Right lower extremity thromboembolectomy 3.  Exposure right popliteal and anterior tibial, peroneal, posterior tibial arteries 4.  4 compartment fasciotomies right lower extremity 5.  Placement of negative pressure dressing to 4 compartment fasciotomies.  -pt with + doppler signals right foot.  Continue heparin at 500U/hr -wound vacs with good seal -dry gauze to right groin to wick moisture and help prevent wound infection.    Doreatha Massed, PA-C 10/04/2019 2:13 PM [phone]

## 2019-10-04 NOTE — Progress Notes (Signed)
eLink Physician-Brief Progress Note Patient Name: [name] DOB: 01-12-1966 MRN: [id number]   Date of Service  10/04/2019  HPI/Events of Note  Hypernatremia slowly improving.  eICU Interventions  0.45 % saline infusion rate decreased to 150 ml/ hour, Free water rate via NG tube frequency  increased to 300 ml Q 2 hours.        Arloa Prak U Kadesia Robel 10/04/2019, 1:22 AM

## 2019-10-04 NOTE — Progress Notes (Signed)
Subjective/Chief Complaint: Pt with no acute changes    Objective: Vital signs in last 24 hours: Temp:  [98.5 F (36.9 C)-100.5 F (38.1 C)] 98.5 F (36.9 C) (02/17 0728) Pulse Rate:  [94-139] 98 (02/17 0614) Resp:  [20-43] 21 (02/17 0614) BP: (97-166)/(60-134) 163/86 (02/17 0614) SpO2:  [86 %-99 %] 96 % (02/17 0614) Weight:  [105.1 kg] 105.1 kg (02/17 0500) Last BM Date: (pta)  Intake/Output from previous day: 02/16 0701 - 02/17 0700 In: 7068.8 [I.V.:4468.1; NG/GT:1800; IV Piggyback:500.7] Out: 1590 [Urine:1590] Intake/Output this shift: No intake/output data recorded.  Constitutional: No acute distress, conversant, appears states age Eyes: Anicteric sclerae, moist conjunctiva, no lid lag Lungs: Clear to auscultation bilaterally, normal respiratory effort CV: regular rate and rhythm, no murmurs, no peripheral edema, pedal pulses 2+ GI: Soft, no masses or hepatosplenomegaly, tender to palpation to mons area of erythema Skin: No rashes, palpation reveals normal turgor Psychiatric: not appropriate judgment and insight, not oriented to person, place, and time   Lab Results:  Recent Labs    10/03/19 0430 10/03/19 0430 10/03/19 0728 10/04/19 0428  WBC 22.8*  --   --  18.0*  HGB 16.4*   < > 19.0* 15.6*  HCT 52.1*   < > 56.0* 50.2*  PLT 267  --   --  178   < > = values in this interval not displayed.   BMET Recent Labs    10/04/19 0009 10/04/19 0428  NA 166* 163*  K 3.9 3.3*  CL 125* 120*  CO2 28 28  GLUCOSE 209* 215*  BUN 30* 26*  CREATININE 1.01* 0.93  CALCIUM 8.7* 8.7*   PT/INR Recent Labs    10/04/19 0428  LABPROT 18.3*  INR 1.5*   ABG Recent Labs    10/02/19 2253 10/03/19 0728  PHART 7.365 7.422  HCO3 19.8* 31.0*    Studies/Results: CT ABDOMEN PELVIS WO CONTRAST  Result Date: 10/03/2019 CLINICAL DATA:  Nominal distension. EXAM: CT ABDOMEN AND PELVIS WITHOUT CONTRAST TECHNIQUE: Multidetector CT imaging of the abdomen and pelvis was  performed following the standard protocol without IV contrast. COMPARISON:  Contrast-enhanced CT 5 days ago FINDINGS: Lower chest: No consolidation or pleural fluid. Breathing motion artifact limits detailed assessment. Hepatobiliary: Mild hepatic steatosis without focal lesion. Clips in the gallbladder fossa postcholecystectomy. No biliary dilatation. Pancreas: No ductal dilatation or inflammation. Spleen: Normal in size without focal abnormality. Adrenals/Urinary Tract: Normal adrenal glands. No hydronephrosis or perinephric edema. No renal or ureteral calculi. Urinary bladder is physiologically distended. No bladder wall thickening. Stomach/Bowel: Bowel evaluation limited in the absence of contrast and patient motion. Nondistended stomach. Few prominent fluid-filled small bowel without obstruction or inflammation. Normal appendix. Moderate colonic stool burden without inflammation. Vascular/Lymphatic: Abdominal aorta is normal in caliber. Minor aortic atherosclerosis. Prominent bilateral external iliac and inguinal nodes. Reproductive: Uterus and bilateral adnexa are unremarkable. Other: Previous labial and right perineal soft tissue thickening has improved, however there is patchy soft tissue air in the right perineum, not entirely included in the field of view. This tracks anteriorly towards the right inguinal region. Heterogeneous stranding and induration of the anterior abdominal wall superficial to the abdominal wall musculature with ill-defined fluid measuring approximately 6.1 by 2.2 cm, series 3, image 73. Lack of contrast limits more detailed evaluation. There is no intra-abdominal free air free fluid. Small fat containing umbilical hernia. Musculoskeletal: Facet hypertrophy in the lower lumbar spine. There are no acute or suspicious osseous abnormalities. IMPRESSION: 1. Soft tissue gas in the right  labia and perineum. Labial induration on CT 5 days ago has improved. This area is not entirely included in  the field of view. Cannot differentiate soft tissue changes/air from recent incision and drainage versus necrotizing soft tissue infection. 2. There is new subcutaneous induration and inflammatory changes involving the right anterior abdominal wall with ill-defined fluid measuring approximately 6.1 x 2.2 cm. This is suspicious for soft tissue infection with phlegmonous or early abscess changes. Inflammatory changes remains superficial to the anterior abdominal wall. 3. Prominent bilateral external iliac and inguinal nodes are likely reactive. 4. Mild hepatic steatosis. These results were called by telephone at the time of interpretation on 10/03/2019 at 12:57 am to PA ABIGAIL HARRIS , who verbally acknowledged these results. Aortic Atherosclerosis (ICD10-I70.0). Electronically Signed   By: Narda Rutherford M.D.   On: 10/03/2019 00:58   DG Chest Portable 1 View  Result Date: 10/03/2019 CLINICAL DATA:  Hypoxia. Additional history provided by technologist: History of asthma diabetes. EXAM: PORTABLE CHEST 1 VIEW COMPARISON:  Same day CT abdomen/pelvis 10/03/2019, chest radiograph 10/02/2019 FINDINGS: Heart size within normal limits. No evidence of airspace consolidation within the lungs. No evidence of pleural effusion or pneumothorax. No acute bony abnormality. Thoracic spondylosis. Overlying cardiac monitoring leads. IMPRESSION: No radiographic evidence of acute cardiopulmonary abnormality. Electronically Signed   By: Jackey Loge DO   On: 10/03/2019 08:00   DG Chest Port 1 View  Result Date: 10/02/2019 CLINICAL DATA:  Altered mental status EXAM: PORTABLE CHEST 1 VIEW COMPARISON:  01/16/2013 FINDINGS: Rotated patient. Slight asymmetric increased interstitial opacity left thorax. No consolidation. Normal heart size. Mild convex opacity at the right hilus likely due to vascular structure and patient rotation. No pneumothorax. IMPRESSION: Slight asymmetric interstitial opacity in the left thorax, question atypical  infection, doubt asymmetric edema. Electronically Signed   By: Jasmine Pang M.D.   On: 10/02/2019 22:47    Anti-infectives: Anti-infectives (From admission, onward)   Start     Dose/Rate Route Frequency Ordered Stop   10/03/19 2200  vancomycin (VANCOCIN) IVPB 1000 mg/200 mL premix     1,000 mg 200 mL/hr over 60 Minutes Intravenous Every 24 hours 10/03/19 0430     10/03/19 1800  fluconazole (DIFLUCAN) 40 MG/ML suspension 152 mg     150 mg Per Tube  Once 10/03/19 1645 10/03/19 1831   10/03/19 0600  cefTRIAXone (ROCEPHIN) 2 g in sodium chloride 0.9 % 100 mL IVPB     2 g 200 mL/hr over 30 Minutes Intravenous Every 24 hours 10/03/19 0404     10/03/19 0415  vancomycin (VANCOCIN) IVPB 1000 mg/200 mL premix  Status:  Discontinued     1,000 mg 200 mL/hr over 60 Minutes Intravenous  Once 10/03/19 0404 10/03/19 0405   10/02/19 2315  ceFEPIme (MAXIPIME) 2 g in sodium chloride 0.9 % 100 mL IVPB     2 g 200 mL/hr over 30 Minutes Intravenous  Once 10/02/19 2306 10/03/19 0002   10/02/19 2315  metroNIDAZOLE (FLAGYL) IVPB 500 mg     500 mg 100 mL/hr over 60 Minutes Intravenous  Once 10/02/19 2306 10/03/19 0100   10/02/19 2315  vancomycin (VANCOCIN) IVPB 1000 mg/200 mL premix  Status:  Discontinued     1,000 mg 200 mL/hr over 60 Minutes Intravenous  Once 10/02/19 2306 10/02/19 2310   10/02/19 2315  vancomycin (VANCOREADY) IVPB 2000 mg/400 mL     2,000 mg 200 mL/hr over 120 Minutes Intravenous  Once 10/02/19 2310 10/03/19 0244  Assessment/Plan: Altered mental status/syncope Uncontrolled type 2 diabetes Hyper osmolar nonketotic hyperglycemia Delirium Hypernatremia Acute kidney disease  Severe dehydration  Sepsis Perineal infection with I&D in ED 09/28/2019 Abdominal wall cellulitis Leukocytosis  FEN: IV fluids/n.p.o. ID: Maxipime 2/15 x 1; Rocephin 2/16>> day 1; vancomycin 2/15>> day 2 DVT: Lovenox Follow-up: TBD   Plan: -Pt with decrease in erythema at mons area -would con't  with dressing changes to labial I&D site with wet to dry guaze -Abx -no plans at this time for surgery   LOS: 1 day    Ralene Ok 10/04/2019

## 2019-10-04 NOTE — Anesthesia Preprocedure Evaluation (Addendum)
Anesthesia Evaluation  Patient identified by MRN, date of birth, ID band Patient unresponsive    Reviewed: Allergy & Precautions, Patient's Chart, lab work & pertinent test results, Unable to perform ROS - Chart review onlyPreop documentation limited or incomplete due to emergent nature of procedure.  History of Anesthesia Complications Negative for: history of anesthetic complications  Airway Mallampati: Intubated       Dental   Pulmonary asthma , Current Smoker and Patient abstained from smoking.,   Intubated     + decreased breath sounds  + intubated    Cardiovascular  Rhythm:Regular Rate:Normal     Neuro/Psych PSYCHIATRIC DISORDERS Anxiety Depression    GI/Hepatic   Endo/Other  diabetes, Type 2, Oral Hypoglycemic Agents Obesity Hypernatremia Hyperchloremia DKA   Renal/GU      Musculoskeletal   Abdominal   Peds  Hematology  INR 1.5    Anesthesia Other Findings Covid neg 10/02/19 Sepsis Perineal abscess   Reproductive/Obstetrics                            Anesthesia Physical Anesthesia Plan  ASA: IV and emergent  Anesthesia Plan: General   Post-op Pain Management:    Induction: Inhalational  PONV Risk Score and Plan: 2 and Treatment may vary due to age or medical condition and Ondansetron  Airway Management Planned: Oral ETT  Additional Equipment: None  Intra-op Plan:   Post-operative Plan: Post-operative intubation/ventilation  Informed Consent:     Only emergency history available and History available from chart only  Plan Discussed with: CRNA and Anesthesiologist  Anesthesia Plan Comments:        Anesthesia Quick Evaluation

## 2019-10-04 NOTE — Progress Notes (Signed)
NAME:  [name], MRN:  [id number], DOB:  March 11, 1966, LOS: 1 ADMISSION DATE:  10/02/2019, CONSULTATION DATE:  2/16 REFERRING MD:  Dr. Sunnie Nielsen, CHIEF COMPLAINT:  Hypernatremia, encephalopathy   Brief History   54 year old female admitted for encephalopathy in the setting of HHNK and hypernatremia.  History of present illness   54 year old female with PMH as below, which is significant for DM and asthma. She was recently 2/10 evaluated in [facility] long emergency department with complaints of perineal abscess.  This was drained and packed in the emergency department as the patient refused admission to the hospital at that time.  Then, in the evening hours of 2/15 she presented to [facility] emergency department after being found down by her son at home.  It is unknown how long she was down.  Upon EMS arrival she was noted to be confused, Tachypneic, and hypoxemic to 88% on room air.  Upon arrival to the emergency department she was noted to have profoundly elevated levels of serum glucose and sodium.  She was treated for DKA versus HHS with IV fluid administration and insulin infusion.  CT scan of the abdomen was done to further evaluate the extent of the perineal abscess.  This showed gas in the right labia with extension of infection to the abdominal wall and rectus muscle.  She has been evaluated by general surgery in the emergency department who recommend IV antibiotics alone at this time.  Because of ongoing encephalopathy PCCM was asked to evaluate for admission to ICU.   Past Medical History   has a past medical history of Allergy, Anxiety, Asthma, Depression, and Diabetes mellitus without complication (HCC).   Significant Hospital Events   2/15 admit  2/16 urine  Consults:  Surgery  Procedures:  2/10 I?D perineal abscess.   Significant Diagnostic Tests:  CT abdomen/pelvis 2/15 > Soft tissue gas in the right labia and perineum. Labial induration on CT 5 days ago has improved.  There is new subcutaneous induration and inflammatory changes involving the right anterior abdominal wall with ill-defined fluid measuring approximately 6.1 x 2.2 cm.  Micro Data:  Flu/Covid negative on admission Blood 2/15 >  Antimicrobials:  Clindamycin 2/10 > 2/15 Cefepime 2/15 Flagyl 2/15  Vancomycin 2/15 >>> Ceftriaxone 2/15 >>>  Interim history/subjective:  Severe R leg pain/numbness this AM, no pulses below R femoral.  Objective   Blood pressure (!) 148/76, pulse 85, temperature 98.5 F (36.9 C), resp. rate (!) 21, weight 105.1 kg, SpO2 93 %.        Intake/Output Summary (Last 24 hours) at 10/04/2019 0830 Last data filed at 10/04/2019 8338 Gross per 24 hour  Intake 7403.96 ml  Output 1740 ml  Net 5663.96 ml   Filed Weights   10/02/19 2222 10/04/19 0500  Weight: 112 kg 105.1 kg    Examination: GEN: confused 54 year old obese woman lying in bed HEENT: MM dry, trachea midline CV: RRR, no murmurs PULM: Clear, no accessory muscle use GI: Soft, +BS EXT: RLE cool to touch, pale, no palpable pulses, LLE warm NEURO: Moves all 4 ext except RLE PSYCH: RASS 0, poor insight SKIN: erythema over lower stomach, labial incision site looks okay   Resolved Hospital Problem list     Assessment & Plan:   Diabetic ketoacidosis, severe hypernatremia DM: poorly controlled historically - Now on levemir plus SSI due to gap closing - D5W, watch sugars/Na - Continue aggressive enteral supplementation  Sepsis secondary to cellulitis and perineal  abscess. Abscess was drained 2/10. Has been on clindamycin since 2/10. CT abdomen shows gas in the R labia and extension of infection into soft tissue of the lower abdomen.  - Ceftriaxone and Vancomycin - Follow blood cultures  - Diflucan given x 1 - General surgery following, recommending IV abx alone at this time.   New: ischemic RLE- vascular evaluation stat, heparin gtt, NPO  Acute metabolic encephalopathy:  In the setting of DKA,  sepsis - Supportive care as above   Best practice:  Diet: NPO Pain/Anxiety/Delirium protocol (if indicated):NA VAP protocol (if indicated): NA DVT prophylaxis: Lovenox GI prophylaxis: NA Glucose control: see above, probably will need to go back on insulin infusion Mobility: BR Code Status: FULL Family Communication: Son Remo Lipps vs. sister to be called by Vascular surgeon Disposition: OR then back to ICU    The patient is critically ill with multiple organ systems failure and requires high complexity decision making for assessment and support, frequent evaluation and titration of therapies, application of advanced monitoring technologies and extensive interpretation of multiple databases. Critical Care Time devoted to patient care services described in this note independent of APP/resident time (if applicable)  is 34 minutes.   Erskine Emery MD [facility] 10/04/2019 9:00 AM Personal pager: [phone] If unanswered, please page CCM On-call: [phone]

## 2019-10-04 NOTE — Anesthesia Postprocedure Evaluation (Signed)
Anesthesia Post Note  Patient: Kalsey Lull Lefkowitz  Procedure(s) Performed: RIGHT LEG THROMBECTOMY (Right Leg Lower) Right leg Fasciotomy (Right Leg Lower) Application Of Wound Vac (Right Leg Lower)     Patient location during evaluation: ICU Anesthesia Type: General Level of consciousness: sedated and patient remains intubated per anesthesia plan Pain management: pain level controlled Vital Signs Assessment: post-procedure vital signs reviewed and stable Respiratory status: patient remains intubated per anesthesia plan Cardiovascular status: stable Postop Assessment: no apparent nausea or vomiting Anesthetic complications: no    Last Vitals:  Vitals:   10/04/19 0930 10/04/19 0945  BP: 131/68 (!) 202/101  Pulse: 97 (!) 116  Resp: 18 18  Temp: 37.9 C   SpO2: 96% 95%    Last Pain:  Vitals:   10/04/19 0930  TempSrc: Rectal  PainSc:                  Beryle Lathe

## 2019-10-04 NOTE — Transfer of Care (Signed)
Immediate Anesthesia Transfer of Care Note  Patient: [name]  Procedure(s) Performed: RIGHT LEG THROMBECTOMY (Right Leg Lower) Right leg Fasciotomy (Right Leg Lower) Application Of Wound Vac (Right Leg Lower)  Patient Location: ICU  Anesthesia Type:General  Level of Consciousness: unresponsive and Patient remains intubated per anesthesia plan  Airway & Oxygen Therapy: Patient remains intubated per anesthesia plan and Patient placed on Ventilator (see vital sign flow sheet for setting)  Post-op Assessment: Report given to RN and Post -op Vital signs reviewed and stable  Post vital signs: Reviewed and stable  Last Vitals:  Vitals Value Taken Time  BP    Temp    Pulse 81 10/04/19 1227  Resp 18 10/04/19 1229  SpO2 95 % 10/04/19 1227  Vitals shown include unvalidated device data.  Last Pain:  Vitals:   10/04/19 0930  TempSrc: Rectal  PainSc:          Complications: No apparent anesthesia complications

## 2019-10-04 NOTE — Procedures (Signed)
Central Venous Catheter Insertion Procedure Note [name] [id number] [birth year]  Procedure: Insertion of Central Venous Catheter Indications: Drug and/or fluid administration  Procedure Details Consent: Risks of procedure as well as the alternatives and risks of each were explained to the (patient/caregiver).  Consent for procedure obtained. Time Out: Verified patient identification, verified procedure, site/side was marked, verified correct patient position, special equipment/implants available, medications/allergies/relevent history reviewed, required imaging and test results available.  Performed  Maximum sterile technique was used including antiseptics, cap, gloves, gown, hand hygiene, mask and sheet. Skin prep: Chlorhexidine; local anesthetic administered A antimicrobial bonded/coated triple lumen catheter was placed in the left internal jugular vein using the Seldinger technique.  Evaluation Blood flow good Complications: No apparent complications Patient did tolerate procedure well. Chest X-ray ordered to verify placement.  CXR: normal.  Delfin Gant, NP-C [facility] Contact / Pager information can be found on Amion  10/04/2019, 5:27 PM

## 2019-10-04 NOTE — Progress Notes (Signed)
EndoTool alerted that pt met criteria to transition patient off of IV insulin. Elink made aware. Will continue to monitor.

## 2019-10-04 NOTE — Progress Notes (Signed)
CRITICAL VALUE ALERT  Critical Value:  Na 166  Date & Time Notied:  10/04/2019 @ 00:45  Provider Notified: Ogan  Orders Received/Actions taken: TBD

## 2019-10-04 NOTE — Progress Notes (Signed)
eLink Physician-Brief Progress Note Patient Name: [name] DOB: [birth year] MRN: [id number]   Date of Service  10/04/2019  HPI/Events of Note  RN requesting CTA order be cancelled as pt. Was taken emergently to OR and it was not done . Pt. Is on a heparin gtt  eICU Interventions  Will D/C  Order for CTA AM team to determine if they want to re-order in am 2/18     Intervention Category Intermediate Interventions: Other:  Bevelyn Ngo 10/04/2019, 10:53 PM

## 2019-10-05 ENCOUNTER — Inpatient Hospital Stay (HOSPITAL_COMMUNITY): Payer: Self-pay

## 2019-10-05 DIAGNOSIS — A419 Sepsis, unspecified organism: Secondary | ICD-10-CM

## 2019-10-05 LAB — BASIC METABOLIC PANEL
Anion gap: 6 (ref 5–15)
Anion gap: 9 (ref 5–15)
Anion gap: 8 (ref 5–15)
BUN: 11 mg/dL (ref 6–20)
BUN: 8 mg/dL (ref 6–20)
BUN: 7 mg/dL (ref 6–20)
CO2: 26 mmol/L (ref 22–32)
CO2: 26 mmol/L (ref 22–32)
CO2: 26 mmol/L (ref 22–32)
Calcium: 7.7 mg/dL — ABNORMAL LOW (ref 8.9–10.3)
Calcium: 8.3 mg/dL — ABNORMAL LOW (ref 8.9–10.3)
Calcium: 8.3 mg/dL — ABNORMAL LOW (ref 8.9–10.3)
Chloride: 113 mmol/L — ABNORMAL HIGH (ref 98–111)
Chloride: 115 mmol/L — ABNORMAL HIGH (ref 98–111)
Chloride: 115 mmol/L — ABNORMAL HIGH (ref 98–111)
Creatinine, Ser: 0.72 mg/dL (ref 0.44–1.00)
Creatinine, Ser: 0.81 mg/dL (ref 0.44–1.00)
Creatinine, Ser: 0.71 mg/dL (ref 0.44–1.00)
GFR calc Af Amer: >60 mL/min (ref >60)
GFR calc Af Amer: >60 mL/min (ref >60)
GFR calc Af Amer: >60 mL/min (ref >60)
GFR calc non Af Amer: >60 mL/min (ref >60)
GFR calc non Af Amer: >60 mL/min (ref >60)
GFR calc non Af Amer: >60 mL/min (ref >60)
Glucose, Bld: 230 mg/dL — ABNORMAL HIGH (ref 70–99)
Glucose, Bld: 265 mg/dL — ABNORMAL HIGH (ref 70–99)
Glucose, Bld: 220 mg/dL — ABNORMAL HIGH (ref 70–99)
Potassium: 3.1 mmol/L — ABNORMAL LOW (ref 3.5–5.1)
Potassium: 4.4 mmol/L (ref 3.5–5.1)
Potassium: 3.8 mmol/L (ref 3.5–5.1)
Sodium: 145 mmol/L (ref 135–145)
Sodium: 150 mmol/L — ABNORMAL HIGH (ref 135–145)
Sodium: 149 mmol/L — ABNORMAL HIGH (ref 135–145)

## 2019-10-05 LAB — CBC WITH DIFFERENTIAL/PLATELET
Abs Immature Granulocytes: 0.2 10*3/uL — ABNORMAL HIGH (ref 0.00–0.07)
Basophils Absolute: 0 10*3/uL (ref 0.0–0.1)
Basophils Relative: 0 %
Eosinophils Absolute: 0.7 10*3/uL — ABNORMAL HIGH (ref 0.0–0.5)
Eosinophils Relative: 3 %
HCT: 38.3 % (ref 36.0–46.0)
Hemoglobin: 11.6 g/dL — ABNORMAL LOW (ref 12.0–15.0)
Lymphocytes Relative: 5 %
Lymphs Abs: 1.1 10*3/uL (ref 0.7–4.0)
MCH: 30.1 pg (ref 26.0–34.0)
MCHC: 30.3 g/dL (ref 30.0–36.0)
MCV: 99.2 fL (ref 80.0–100.0)
Metamyelocytes Relative: 1 %
Monocytes Absolute: 0.4 10*3/uL (ref 0.1–1.0)
Monocytes Relative: 2 %
Neutro Abs: 19.8 10*3/uL — ABNORMAL HIGH (ref 1.7–7.7)
Neutrophils Relative %: 89 %
Platelets: 129 10*3/uL — ABNORMAL LOW (ref 150–400)
RBC: 3.86 MIL/uL — ABNORMAL LOW (ref 3.87–5.11)
RDW: 14.2 % (ref 11.5–15.5)
WBC: 22.2 10*3/uL — ABNORMAL HIGH (ref 4.0–10.5)
nRBC: 0.3 % — ABNORMAL HIGH (ref 0.0–0.2)
nRBC: 1 /100 WBC — ABNORMAL HIGH

## 2019-10-05 LAB — CK
Total CK: 5114 U/L — ABNORMAL HIGH (ref 38–234)
Total CK: 4775 U/L — ABNORMAL HIGH (ref 38–234)
Total CK: 4939 U/L — ABNORMAL HIGH (ref 38–234)

## 2019-10-05 LAB — GLUCOSE, CAPILLARY
Glucose-Capillary: 172 mg/dL — ABNORMAL HIGH (ref 70–99)
Glucose-Capillary: 180 mg/dL — ABNORMAL HIGH (ref 70–99)
Glucose-Capillary: 203 mg/dL — ABNORMAL HIGH (ref 70–99)
Glucose-Capillary: 211 mg/dL — ABNORMAL HIGH (ref 70–99)
Glucose-Capillary: 234 mg/dL — ABNORMAL HIGH (ref 70–99)
Glucose-Capillary: 264 mg/dL — ABNORMAL HIGH (ref 70–99)
Glucose-Capillary: 268 mg/dL — ABNORMAL HIGH (ref 70–99)

## 2019-10-05 LAB — HEPARIN LEVEL (UNFRACTIONATED)
Heparin Unfractionated: <0.1 IU/mL — ABNORMAL LOW (ref 0.30–0.70)
Heparin Unfractionated: 0.39 IU/mL (ref 0.30–0.70)

## 2019-10-05 LAB — ECHOCARDIOGRAM COMPLETE: Weight: 3707.26 oz

## 2019-10-05 MED ORDER — POTASSIUM CHLORIDE 20 MEQ/15ML (10%) PO SOLN
30.0000 meq | ORAL | Status: AC
  Administered 2019-10-05 (×2): 30 meq
  Filled 2019-10-05 (×2): qty 30

## 2019-10-05 MED ORDER — HEPARIN BOLUS VIA INFUSION
4000.0000 [IU] | Freq: Once | INTRAVENOUS | Status: AC
  Administered 2019-10-05: 4000 [IU] via INTRAVENOUS
  Filled 2019-10-05: qty 4000

## 2019-10-05 MED ORDER — ORAL CARE MOUTH RINSE
15.0000 mL | Freq: Two times a day (BID) | OROMUCOSAL | Status: DC
  Administered 2019-10-05 – 2019-10-12 (×12): 15 mL via OROMUCOSAL

## 2019-10-05 MED ORDER — PANTOPRAZOLE SODIUM 40 MG IV SOLR
40.0000 mg | INTRAVENOUS | Status: DC
  Administered 2019-10-05 – 2019-10-08 (×4): 40 mg via INTRAVENOUS
  Filled 2019-10-05 (×4): qty 40

## 2019-10-05 MED ORDER — LACTATED RINGERS IV SOLN: INTRAVENOUS | Status: DC

## 2019-10-05 MED ORDER — HEPARIN (PORCINE) 25000 UT/250ML-% IV SOLN
1400.0000 [IU]/h | INTRAVENOUS | Status: DC
  Administered 2019-10-05 (×2): 1400 [IU]/h via INTRAVENOUS
  Filled 2019-10-05 (×2): qty 250

## 2019-10-05 MED ORDER — ARFORMOTEROL TARTRATE 15 MCG/2ML IN NEBU
15.0000 ug | INHALATION_SOLUTION | Freq: Two times a day (BID) | RESPIRATORY_TRACT | Status: DC
  Administered 2019-10-05 – 2019-10-12 (×15): 15 ug via RESPIRATORY_TRACT
  Filled 2019-10-05 (×16): qty 2

## 2019-10-05 MED ORDER — BUDESONIDE 0.5 MG/2ML IN SUSP
0.5000 mg | Freq: Two times a day (BID) | RESPIRATORY_TRACT | Status: DC
  Administered 2019-10-05 – 2019-10-12 (×15): 0.5 mg via RESPIRATORY_TRACT
  Filled 2019-10-05 (×16): qty 2

## 2019-10-05 NOTE — Progress Notes (Signed)
Assisted tele visit to patient with family member.  Stephanie Clayborn Anderson, RN   

## 2019-10-05 NOTE — Progress Notes (Addendum)
ANTICOAGULATION CONSULT NOTE - Initial Consult  Pharmacy Consult for heparin  Indication: DVT/Ischemic right leg  Allergies  Allergen Reactions  . Codeine   . Guaifenesin Hives    Patient Measurements: Weight: 231 lb 11.3 oz (105.1 kg) Heparin Dosing Weight: 83kg  Vital Signs: Temp: 100 F (37.8 C) (02/18 0800) Temp Source: Oral (02/18 0800) BP: 101/55 (02/18 0800) Pulse Rate: 72 (02/18 0800)  Labs: Recent Labs    10/02/19 2241 10/02/19 2241 10/02/19 2253 10/03/19 0405 10/03/19 0405 10/03/19 0430 10/03/19 0728 10/04/19 0428 10/04/19 0428 10/04/19 1230 10/04/19 1254 10/04/19 1254 10/04/19 1830 10/04/19 2104 10/05/19 0219 10/05/19 0350  HGB 18.2*  --    < > 16.4*   < > 16.4*   < > 15.6*   < >  --  13.3   < > 11.5*  --   --  11.6*  HCT 57.5*  --    < > 52.2*   < > 52.1*   < > 50.2*   < >  --  39.0  --  37.3  --   --  38.3  PLT 321  --    < > 269   < > 267  --  178  --   --   --   --   --   --   --  129*  LABPROT  --   --   --   --   --   --   --  18.3*  --   --   --   --   --   --   --   --   INR  --   --   --   --   --   --   --  1.5*  --   --   --   --   --   --   --   --   CREATININE 2.08*   < >   < > 1.68*   < > 1.26*   < > 0.93  --  1.01*  --   --   --  0.75 0.72  --   CKTOTAL 19*   < >  --   --   --   --   --   --   --  1,152*  --    < > 2,890* 3,412* 5,114*  --   TROPONINIHS 22*  --   --  24*  --   --   --   --   --   --   --   --   --   --   --   --    < > = values in this interval not displayed.    Estimated Creatinine Clearance: 98.5 mL/min (by C-G formula based on SCr of 0.72 mg/dL).   Medical History: Past Medical History:  Diagnosis Date  . Allergy   . Anxiety   . Asthma   . Depression   . Diabetes mellitus without complication [facility])      Assessment: Patient admitted in the ICU for DKA, gap now closed. She also has a perineal abscess being treated conservatively with abx no indication for surgery at this time. Complaining of severe leg pain  the morning of 2/17 and unable to get doppler signals, concerning for DVT. Patient went to OR for RLE thrombolectomy, required 4 compartment fasciotomies. Plans to return to OR tomorrow for closure but in meantime will do full dose heparin drip per vascular has  been on 500 unit/hr until this morning.   Heparin level this AM after 500 units/hr off was <0.10. Will bolus heparin and start drip   Goal of Therapy:  Heparin level goal 0.3-0.5 given recent surg Monitor platelets by anticoagulation protocol: Yes   Plan:   Bolus Heparin 4000 units x1 Start heparin 1,400 units/hr 6 hour heparin level at 1730 Daily Heparin level and CBC  Marliss Czar Curby Carswell 10/05/2019,8:54 AM

## 2019-10-05 NOTE — H&P (View-Only) (Signed)
  Progress Note    10/05/2019 8:43 AM 1 Day Post-Op  Subjective:  Remains intubated  Vitals:   10/05/19 0809 10/05/19 0813  BP:    Pulse:    Resp:    Temp:    SpO2: 93% 93%    Physical Exam: Intubated,sedated Right groin incision cdi Right leg fasciotomy sites with wound vac in place Dp/pt/peroneal signals right ankle Right toe is dusky with delayed cap refill Left foot warm and well perfused with brisk capillary refill  CBC    Component Value Date/Time   WBC 22.2 (H) 10/05/2019 0350   RBC 3.86 (L) 10/05/2019 0350   HGB 11.6 (L) 10/05/2019 0350   HCT 38.3 10/05/2019 0350   PLT 129 (L) 10/05/2019 0350   MCV 99.2 10/05/2019 0350   MCV 89.0 04/02/2015 1507   MCH 30.1 10/05/2019 0350   MCHC 30.3 10/05/2019 0350   RDW 14.2 10/05/2019 0350   LYMPHSABS 1.1 10/05/2019 0350   MONOABS 0.4 10/05/2019 0350   EOSABS 0.7 (H) 10/05/2019 0350   BASOSABS 0.0 10/05/2019 0350    BMET    Component Value Date/Time   NA 145 10/05/2019 0219   K 3.1 (L) 10/05/2019 0219   CL 113 (H) 10/05/2019 0219   CO2 26 10/05/2019 0219   GLUCOSE 230 (H) 10/05/2019 0219   BUN 11 10/05/2019 0219   CREATININE 0.72 10/05/2019 0219   CREATININE 0.81 04/02/2015 1452   CALCIUM 7.7 (L) 10/05/2019 0219   GFRNONAA >60 10/05/2019 0219   GFRAA >60 10/05/2019 0219    INR    Component Value Date/Time   INR 1.5 (H) 10/04/2019 0428     Intake/Output Summary (Last 24 hours) at 10/05/2019 0843 Last data filed at 10/05/2019 0800 Gross per 24 hour  Intake 7221.45 ml  Output 2930 ml  Net 4291.45 ml     Assessment:  53 y.o. female is s/p:  1.  Exposure right common femoral, sfa and profunda femoris arteries 2.  Right lower extremity thromboembolectomy 3.  Exposure right popliteal and anterior tibial, peroneal, posterior tibial arteries 4.  4 compartment fasciotomies right lower extremity 5.  Placement of negative pressure dressing to 4 compartment fasciotomies. 1 Day Post-Op  Plan: Full dose  heparin today Plan to return to OR tomorrow for washout and possible closure of fasciotomy sites  Will need echo to evaluate for source of embolus  Yerick Eggebrecht C. Jarrick Fjeld, MD [facility] Office: [phone] Pager: [phone]  10/05/2019 8:43 AM  

## 2019-10-05 NOTE — Progress Notes (Signed)
ANTICOAGULATION CONSULT NOTE - Follow Up Consult  Pharmacy Consult for He Indication: DVT/Ischemic leg  Allergies  Allergen Reactions  . Codeine   . Guaifenesin Hives    Patient Measurements: Weight: 231 lb 11.3 oz (105.1 kg) Heparin Dosing Weight: 83 kg  Vital Signs: Temp: 99.4 F (37.4 C) (02/18 1200) Temp Source: Oral (02/18 1200) BP: 117/59 (02/18 1900) Pulse Rate: 82 (02/18 1900)  Labs: Recent Labs    10/02/19 2241 10/02/19 2253 10/03/19 0405 10/03/19 0405 10/03/19 0430 10/03/19 0728 10/04/19 0428 10/04/19 1230 10/04/19 1254 10/04/19 1254 10/04/19 1830 10/04/19 1830 10/04/19 2104 10/05/19 0219 10/05/19 0350 10/05/19 0941 10/05/19 1252 10/05/19 1825  HGB 18.2*   < > 16.4*   < > 16.4*   < > 15.6*  --  13.3   < > 11.5*  --   --   --  11.6*  --   --   --   HCT 57.5*   < > 52.2*   < > 52.1*   < > 50.2*  --  39.0  --  37.3  --   --   --  38.3  --   --   --   PLT 321   < > 269   < > 267  --  178  --   --   --   --   --   --   --  129*  --   --   --   LABPROT  --   --   --   --   --   --  18.3*  --   --   --   --   --   --   --   --   --   --   --   INR  --   --   --   --   --   --  1.5*  --   --   --   --   --   --   --   --   --   --   --   HEPARINUNFRC  --   --   --   --   --   --   --   --   --   --   --   --   --   --   --  <0.10*  --  0.39  CREATININE 2.08*   < > 1.68*   < > 1.26*   < > 0.93   < >  --    < >  --   --  0.75 0.72  --   --  0.81  --   CKTOTAL 19*  --   --   --   --   --   --    < >  --    < > 2,890*   < > 3,412* 5,114*  --   --  4,775*  --   TROPONINIHS 22*  --  24*  --   --   --   --   --   --   --   --   --   --   --   --   --   --   --    < > = values in this interval not displayed.    Estimated Creatinine Clearance: 97.3 mL/min (by C-G formula based on SCr of 0.81 mg/dL).   Assessment: 54 year old female on IV Heparin for ischemic leg  now s/p RLE thromboembolectomy and 4 compartment fasciotomies. Plan is to return to OR tomorrow for  closure.   Heparin was resumed at therapeutic rate this AM -- heparin level after bolus and increase is therapeutic at 0.39.   Goal of Therapy:  Heparin level 0.3 to 0.5 units/ml due to recent surgery Monitor platelets by anticoagulation protocol: Yes   Plan:  Continue Heparin at 1400 units/hr.  Repeat Heparin level in AM.  OR schedule is for 1235 PM --unsure of current time to stop IV Heparin, clarify with team in AM.   Sloan Leiter, PharmD, BCPS, BCCCP Clinical Pharmacist Please refer to [facility] for [place] numbers 10/05/2019,7:16 PM

## 2019-10-05 NOTE — Progress Notes (Signed)
  Echocardiogram 2D Echocardiogram has been performed.  [name] 10/05/2019, 3:43 PM

## 2019-10-05 NOTE — Progress Notes (Signed)
NAME:  [name], MRN:  [id number], DOB:  [birth year], LOS: 2 ADMISSION DATE:  10/02/2019, CONSULTATION DATE:  2/16 REFERRING MD:  Dr. Sunnie Nielsen, CHIEF COMPLAINT:  Hypernatremia, encephalopathy   Brief History   54 year old female admitted for encephalopathy in the setting of HHNK and hypernatremia.  History of present illness   54 year old female with PMH as below, which is significant for DM and asthma. She was recently 2/10 evaluated in [facility] long emergency department with complaints of perineal abscess.  This was drained and packed in the emergency department as the patient refused admission to the hospital at that time.  Then, in the evening hours of 2/15 she presented to [facility] emergency department after being found down by her son at home.  It is unknown how long she was down.  Upon EMS arrival she was noted to be confused, Tachypneic, and hypoxemic to 88% on room air.  Upon arrival to the emergency department she was noted to have profoundly elevated levels of serum glucose and sodium.  She was treated for DKA versus HHS with IV fluid administration and insulin infusion.  CT scan of the abdomen was done to further evaluate the extent of the perineal abscess.  This showed gas in the right labia with extension of infection to the abdominal wall and rectus muscle.  She has been evaluated by general surgery in the emergency department who recommend IV antibiotics alone at this time.  Because of ongoing encephalopathy PCCM was asked to evaluate for admission to ICU.   Past Medical History   has a past medical history of Allergy, Anxiety, Asthma, Depression, and Diabetes mellitus without complication (HCC).   Significant Hospital Events   2/15 admit  2/16 urine  Consults:  Surgery  Procedures:  2/10 I?D perineal abscess.  2/17 1.  Exposure right common femoral, sfa and profunda femoris arteries 2.  Right lower extremity thromboembolectomy 3.  Exposure right popliteal and  anterior tibial, peroneal, posterior tibial arteries 4.  4 compartment fasciotomies right lower extremity 5.  Placement of negative pressure dressing to 4 compartment fasciotomies.  Significant Diagnostic Tests:  CT abdomen/pelvis 2/15 > Soft tissue gas in the right labia and perineum. Labial induration on CT 5 days ago has improved. There is new subcutaneous induration and inflammatory changes involving the right anterior abdominal wall with ill-defined fluid measuring approximately 6.1 x 2.2 cm.  Micro Data:  Flu/Covid negative on admission Blood 2/15 >  Antimicrobials:  Clindamycin 2/10 > 2/15 Cefepime 2/15 Flagyl 2/15  Vancomycin 2/15 >>> Ceftriaxone 2/15 >>>  Interim history/subjective:  Severe R leg pain/numbness this AM, no pulses below R femoral.  Objective   Blood pressure (!) 101/55, pulse 72, temperature (!) 100.9 F (38.3 C), temperature source Oral, resp. rate (!) 23, weight 105.1 kg, SpO2 93 %.    Vent Mode: PSV;CPAP FiO2 (%):  [50 %-60 %] 50 % Set Rate:  [18 bmp] 18 bmp Vt Set:  [470 mL] 470 mL PEEP:  [5 cmH20] 5 cmH20 Pressure Support:  [10 cmH20] 10 cmH20 Plateau Pressure:  [16 cmH20-19 cmH20] 18 cmH20   Intake/Output Summary (Last 24 hours) at 10/05/2019 0840 Last data filed at 10/05/2019 0800 Gross per 24 hour  Intake 7221.45 ml  Output 2930 ml  Net 4291.45 ml   Filed Weights   10/02/19 2222 10/04/19 0500  Weight: 112 kg 105.1 kg    Examination: GEN: confused 54 year old obese woman lying in bed on vent HEENT:  ETT in place, trachea midline CV: RRR, no murmurs PULM: Clear, no accessory muscle use GI: Soft, +BS EXT: RLE fasciotomies in place, foot looks much better NEURO: Moves all 4 ext except RLE PSYCH: RASS 0, poor insight SKIN: erythema over lower stomach, improved; labial incision site looked okay 2/17   Resolved Hospital Problem list     Assessment & Plan:   Diabetic ketoacidosis, severe hypernatremia DM: poorly controlled  historically, A1c 15 on admission - Now on levemir plus SSI - Stop D5W - Hopefully can take PO, otherwise will do TF - Continue aggressive enteral free water supplementation  Sepsis secondary to cellulitis and perineal abscess. Abscess was drained 2/10. Has been on clindamycin since 2/10. CT abdomen shows gas in the R labia and extension of infection into soft tissue of the lower abdomen.  - Ceftriaxone and Vancomycin, duration TBD - Follow blood cultures  - Diflucan given x 1 - General surgery following, appreciate help, recommending IV abx alone at this time.   Right femoral clot with RLE compartment syndrome, rhabdo - s/p embolectomy and fasciotomies 2/17, plan for OR tomorrow tentatively - Heparin gtt in interim - Appreciate vascular input - Start LR at 150cc/hr, watch Cr and UoP  Intubation for CT/OR- no need to keep this today - Extubate, swallow screen  Dysphagia- re eval now that sodium bettter  Shock- related to sedation mostly at this point, wean  Acute metabolic encephalopathy:  In the setting of DKA, sepsis - Re eval after extubation   Best practice:  Diet: NPO Pain/Anxiety/Delirium protocol (if indicated):wean after extubation VAP protocol (if indicated): NA DVT prophylaxis: heparin gtt GI prophylaxis: PPI for now Glucose control: see above Mobility: BR Code Status: FULL Family Communication: Updated yesterday, will try to reach out today Disposition: ICU   The patient is critically ill with multiple organ systems failure and requires high complexity decision making for assessment and support, frequent evaluation and titration of therapies, application of advanced monitoring technologies and extensive interpretation of multiple databases. Critical Care Time devoted to patient care services described in this note independent of APP/resident time (if applicable)  is 35 minutes.   Erskine Emery MD [facility] 10/05/2019 8:40 AM Personal pager:  [phone] If unanswered, please page CCM On-call: [phone]

## 2019-10-05 NOTE — Progress Notes (Addendum)
  Progress Note    10/05/2019 8:43 AM 1 Day Post-Op  Subjective:  Remains intubated  Vitals:   10/05/19 0809 10/05/19 0813  BP:    Pulse:    Resp:    Temp:    SpO2: 93% 93%    Physical Exam: Intubated,sedated Right groin incision cdi Right leg fasciotomy sites with wound vac in place Dp/pt/peroneal signals right ankle Right toe is dusky with delayed cap refill Left foot warm and well perfused with brisk capillary refill  CBC    Component Value Date/Time   WBC 22.2 (H) 10/05/2019 0350   RBC 3.86 (L) 10/05/2019 0350   HGB 11.6 (L) 10/05/2019 0350   HCT 38.3 10/05/2019 0350   PLT 129 (L) 10/05/2019 0350   MCV 99.2 10/05/2019 0350   MCV 89.0 04/02/2015 1507   MCH 30.1 10/05/2019 0350   MCHC 30.3 10/05/2019 0350   RDW 14.2 10/05/2019 0350   LYMPHSABS 1.1 10/05/2019 0350   MONOABS 0.4 10/05/2019 0350   EOSABS 0.7 (H) 10/05/2019 0350   BASOSABS 0.0 10/05/2019 0350    BMET    Component Value Date/Time   NA 145 10/05/2019 0219   K 3.1 (L) 10/05/2019 0219   CL 113 (H) 10/05/2019 0219   CO2 26 10/05/2019 0219   GLUCOSE 230 (H) 10/05/2019 0219   BUN 11 10/05/2019 0219   CREATININE 0.72 10/05/2019 0219   CREATININE 0.81 04/02/2015 1452   CALCIUM 7.7 (L) 10/05/2019 0219   GFRNONAA >60 10/05/2019 0219   GFRAA >60 10/05/2019 0219    INR    Component Value Date/Time   INR 1.5 (H) 10/04/2019 0428     Intake/Output Summary (Last 24 hours) at 10/05/2019 0843 Last data filed at 10/05/2019 0800 Gross per 24 hour  Intake 7221.45 ml  Output 2930 ml  Net 4291.45 ml     Assessment:  54 y.o. female is s/p:  1.  Exposure right common femoral, sfa and profunda femoris arteries 2.  Right lower extremity thromboembolectomy 3.  Exposure right popliteal and anterior tibial, peroneal, posterior tibial arteries 4.  4 compartment fasciotomies right lower extremity 5.  Placement of negative pressure dressing to 4 compartment fasciotomies. 1 Day Post-Op  Plan: Full dose  heparin today Plan to return to OR tomorrow for washout and possible closure of fasciotomy sites  Will need echo to evaluate for source of embolus  Anuar Walgren C. Randie Heinz, MD [facility] Office: [phone] Pager: [phone]  10/05/2019 8:43 AM

## 2019-10-05 NOTE — Progress Notes (Signed)
eLink Physician-Brief Progress Note Patient Name: DARLETTE DUBOW DOB: Nov 20, 1965 MRN: [id number]   Date of Service  10/05/2019  HPI/Events of Note  Per Endotool Pt meets criteria for transition off insulin infusion.  eICU Interventions  Phase 3 Insulin transition orders entered.        Franki Alcaide U Yajayra Feldt 10/05/2019, 12:00 AM

## 2019-10-05 NOTE — Progress Notes (Signed)
[facility] ADULT ICU REPLACEMENT PROTOCOL FOR AM LAB REPLACEMENT ONLY  The patient does apply for the [facility] Adult ICU Electrolyte Replacment Protocol based on the criteria listed below:   1. Is GFR >/= 40 ml/min? Yes.    Patient's GFR today is >60 2. Is urine output >/= 0.5 ml/kg/hr for the last 6 hours? Yes.   Patient's UOP is 0.8 ml/kg/hr 3. Is BUN < 60 mg/dL? Yes.    Patient's BUN today is 11 4. Abnormal electrolyte  K 3.1 5. Ordered repletion with: protocol 6. If a panic level lab has been reported, has the CCM MD in charge been notified? Yes.  .   Physician:  Marcene Corning 10/05/2019 4:05 AM

## 2019-10-05 NOTE — Progress Notes (Signed)
1 Day Post-Op      Subjective: Pt sedated on the vent, still on pressors, the abdominal wall cellulitis is much better. The perineal I&D site is harder to see because of her sedation, size, new RLE thrombectomy/fasciotomy.  It is open and I can see some drainage coming from it, drainage on the chucks under the patient.    Objective: Vital signs in last 24 hours: Temp:  [98.5 F (36.9 C)-100.9 F (38.3 C)] 100.9 F (38.3 C) (02/18 0320) Pulse Rate:  [61-116] 69 (02/18 0600) Resp:  [17-25] 19 (02/18 0600) BP: (76-202)/(25-113) 99/53 (02/18 0600) SpO2:  [91 %-99 %] 92 % (02/18 0600) FiO2 (%):  [50 %-60 %] 50 % (02/18 0415) Last BM Date: 10/04/19 4000 IV 3000 free water per NG Urine 2730 Drains 100 TM 100.9 BP maintained w pressors -  Na 145 K+3.1 Glucose 230 CK 5114 WBC 22.2 H/H 11.6/38 Platelets 129K CXR 2/17:  Intubated - developing bibasilar atelectasis/pneumonia R>L Intake/Output from previous day: 02/17 0701 - 02/18 0700 In: 7482.7 [I.V.:4010.8; NG/GT:3000; IV Piggyback:471.9] Out: 3080 [Urine:2730; Drains:100; Blood:250] Intake/Output this shift: No intake/output data recorded.  General appearance: sedated on the Vent, in [place], she was not aroused during our manipulations to examine perineal I&D site. Skin: Abdominal cellulitis is much better, still some drainage from the I&D site.  Lab Results:  Recent Labs    10/04/19 0428 10/04/19 1254 10/04/19 1830 10/05/19 0350  WBC 18.0*  --   --  22.2*  HGB 15.6*   < > 11.5* 11.6*  HCT 50.2*   < > 37.3 38.3  PLT 178  --   --  129*   < > = values in this interval not displayed.    BMET Recent Labs    10/04/19 2104 10/05/19 0219  NA 148* 145  K 2.8* 3.1*  CL 113* 113*  CO2 25 26  GLUCOSE 214* 230*  BUN 16 11  CREATININE 0.75 0.72  CALCIUM 7.7* 7.7*   PT/INR Recent Labs    10/04/19 0428  LABPROT 18.3*  INR 1.5*    Recent Labs  Lab 10/02/19 2241 10/04/19 1230  AST 17 59*  ALT 24 25   ALKPHOS 176* 124  BILITOT 2.9* 0.8  PROT 7.8 5.6*  ALBUMIN 2.8* 2.0*     Lipase  No results found for: LIPASE   Medications: . chlorhexidine gluconate (MEDLINE KIT)  15 mL Mouth Rinse BID  . Chlorhexidine Gluconate Cloth  6 each Topical Daily  . free water  300 mL Per Tube Q2H  . insulin aspart  2-6 Units Subcutaneous Q4H  . insulin detemir  20 Units Subcutaneous Q12H  . ipratropium-albuterol  3 mL Nebulization TID  . mouth rinse  15 mL Mouth Rinse 10 times per day  . pantoprazole (PROTONIX) IV  40 mg Intravenous Q24H  . potassium chloride  30 mEq Per Tube Q4H   . sodium chloride Stopped (10/05/19 0127)  . cefTRIAXone (ROCEPHIN)  IV Stopped (10/05/19 0550)  . dextrose 75 mL/hr at 10/05/19 0600  . fentaNYL infusion INTRAVENOUS 50 mcg/hr (10/05/19 0600)  . heparin 500 Units/hr (10/05/19 0600)  . norepinephrine (LEVOPHED) Adult infusion 8 mcg/min (10/05/19 0600)  . propofol (DIPRIVAN) infusion 35 mcg/kg/min (10/05/19 0600)  . vancomycin Stopped (10/04/19 2259)    Assessment/Plan Altered mental status/syncope Uncontrolled type 2 diabetes Hyper osmolar nonketotic hyperglycemia Delirium Hypernatremia Acute kidney disease -creatinine 1.68 Severe dehydration Acute RLE ishcemia  - RLE thrombectomy with 4 compartment fasciotomies, 10/04/19 Dr. Servando Snare  Yeast UTI  Sepsis Perineal infection with I&D in ED 09/28/2019 Abdominal wall cellulitis Leukocytosis  FEN: IV fluids/n.p.o. ID: Maxipime 2/15 x 1; Diflucan x 12/16; Rocephin 2/16>> day31; vancomycin 2/15>> day 4 DVT: Heparin drip Follow-up: TBD  Plan:  Discussed with the nursing staff, right now I would just work to clean open site and keep some padding down there to help with drainage.  Continue antibiotics No cultures available for the I&D site.         LOS: 2 days    Randal Yepiz 10/05/2019 Please see Amion

## 2019-10-05 NOTE — Progress Notes (Signed)
eLink Physician-Brief Progress Note Patient Name: ZAILA CREW DOB: June 23, 1966 MRN: [id number]   Date of Service  10/05/2019  HPI/Events of Note  Pt needs stress ulcer prophylaxis on the ventilator.  eICU Interventions  Pantoprazole 40 mg po daily ordered.        Thomasene Lot Onya Eutsler 10/05/2019, 5:45 AM

## 2019-10-05 NOTE — Progress Notes (Signed)
eLink Physician-Brief Progress Note Patient Name: [name] DOB: 1965-11-29 MRN: [id number]   Date of Service  10/05/2019  HPI/Events of Note  Blood sugar is 230 mg %, Pt is getting glucose containing iv fluids at 150 ml/ hour.  eICU Interventions  Iv dextrose containing fluid reduced to 75 ml/ hour.        Thomasene Lot Yuvan Medinger 10/05/2019, 4:28 AM

## 2019-10-06 ENCOUNTER — Inpatient Hospital Stay (HOSPITAL_COMMUNITY): Payer: Self-pay | Admitting: Certified Registered Nurse Anesthetist

## 2019-10-06 ENCOUNTER — Encounter (HOSPITAL_COMMUNITY)
Admission: EM | Disposition: A | Discharge status: Home Health | Payer: Self-pay | Source: Home / Self Care | Attending: Internal Medicine

## 2019-10-06 ENCOUNTER — Encounter (HOSPITAL_COMMUNITY): Payer: Self-pay | Admitting: Pulmonary Disease

## 2019-10-06 DIAGNOSIS — M79604 Pain in right leg: Secondary | ICD-10-CM

## 2019-10-06 HISTORY — PX: FASCIOTOMY CLOSURE: SHX5829

## 2019-10-06 HISTORY — PX: APPLICATION OF WOUND VAC: SHX5189

## 2019-10-06 LAB — BASIC METABOLIC PANEL
Anion gap: 9 (ref 5–15)
Anion gap: 7 (ref 5–15)
Anion gap: 8 (ref 5–15)
BUN: 6 mg/dL (ref 6–20)
BUN: 7 mg/dL (ref 6–20)
BUN: 8 mg/dL (ref 6–20)
CO2: 27 mmol/L (ref 22–32)
CO2: 27 mmol/L (ref 22–32)
CO2: 27 mmol/L (ref 22–32)
Calcium: 8.1 mg/dL — ABNORMAL LOW (ref 8.9–10.3)
Calcium: 8 mg/dL — ABNORMAL LOW (ref 8.9–10.3)
Calcium: 8.1 mg/dL — ABNORMAL LOW (ref 8.9–10.3)
Chloride: 114 mmol/L — ABNORMAL HIGH (ref 98–111)
Chloride: 113 mmol/L — ABNORMAL HIGH (ref 98–111)
Chloride: 113 mmol/L — ABNORMAL HIGH (ref 98–111)
Creatinine, Ser: 0.81 mg/dL (ref 0.44–1.00)
Creatinine, Ser: 0.66 mg/dL (ref 0.44–1.00)
Creatinine, Ser: 0.7 mg/dL (ref 0.44–1.00)
GFR calc Af Amer: >60 mL/min (ref >60)
GFR calc Af Amer: >60 mL/min (ref >60)
GFR calc Af Amer: >60 mL/min (ref >60)
GFR calc non Af Amer: >60 mL/min (ref >60)
GFR calc non Af Amer: >60 mL/min (ref >60)
GFR calc non Af Amer: >60 mL/min (ref >60)
Glucose, Bld: 156 mg/dL — ABNORMAL HIGH (ref 70–99)
Glucose, Bld: 112 mg/dL — ABNORMAL HIGH (ref 70–99)
Glucose, Bld: 212 mg/dL — ABNORMAL HIGH (ref 70–99)
Potassium: 3.2 mmol/L — ABNORMAL LOW (ref 3.5–5.1)
Potassium: 6 mmol/L — ABNORMAL HIGH (ref 3.5–5.1)
Potassium: 4.1 mmol/L (ref 3.5–5.1)
Sodium: 150 mmol/L — ABNORMAL HIGH (ref 135–145)
Sodium: 147 mmol/L — ABNORMAL HIGH (ref 135–145)
Sodium: 148 mmol/L — ABNORMAL HIGH (ref 135–145)

## 2019-10-06 LAB — GLUCOSE, CAPILLARY
Glucose-Capillary: 102 mg/dL — ABNORMAL HIGH (ref 70–99)
Glucose-Capillary: 111 mg/dL — ABNORMAL HIGH (ref 70–99)
Glucose-Capillary: 143 mg/dL — ABNORMAL HIGH (ref 70–99)
Glucose-Capillary: 170 mg/dL — ABNORMAL HIGH (ref 70–99)
Glucose-Capillary: 201 mg/dL — ABNORMAL HIGH (ref 70–99)
Glucose-Capillary: 251 mg/dL — ABNORMAL HIGH (ref 70–99)
Glucose-Capillary: 98 mg/dL (ref 70–99)

## 2019-10-06 LAB — CK
Total CK: 3658 U/L — ABNORMAL HIGH (ref 38–234)
Total CK: 3320 U/L — ABNORMAL HIGH (ref 38–234)
Total CK: 2742 U/L — ABNORMAL HIGH (ref 38–234)
Total CK: 2111 U/L — ABNORMAL HIGH (ref 38–234)

## 2019-10-06 LAB — SURGICAL PCR SCREEN
MRSA, PCR: NEGATIVE
Staphylococcus aureus: NEGATIVE

## 2019-10-06 LAB — CBC WITH DIFFERENTIAL/PLATELET
Abs Immature Granulocytes: 0.3 10*3/uL — ABNORMAL HIGH (ref 0.00–0.07)
Basophils Absolute: 0.1 10*3/uL (ref 0.0–0.1)
Basophils Relative: 0 %
Eosinophils Absolute: 0.3 10*3/uL (ref 0.0–0.5)
Eosinophils Relative: 1 %
HCT: 35.7 % — ABNORMAL LOW (ref 36.0–46.0)
Hemoglobin: 11.1 g/dL — ABNORMAL LOW (ref 12.0–15.0)
Immature Granulocytes: 2 %
Lymphocytes Relative: 13 %
Lymphs Abs: 2.5 10*3/uL (ref 0.7–4.0)
MCH: 30.4 pg (ref 26.0–34.0)
MCHC: 31.1 g/dL (ref 30.0–36.0)
MCV: 97.8 fL (ref 80.0–100.0)
Monocytes Absolute: 0.5 10*3/uL (ref 0.1–1.0)
Monocytes Relative: 3 %
Neutro Abs: 15.5 10*3/uL — ABNORMAL HIGH (ref 1.7–7.7)
Neutrophils Relative %: 81 %
Platelets: 97 10*3/uL — ABNORMAL LOW (ref 150–400)
RBC: 3.65 MIL/uL — ABNORMAL LOW (ref 3.87–5.11)
RDW: 13.6 % (ref 11.5–15.5)
WBC Morphology: INCREASED
WBC: 19.1 10*3/uL — ABNORMAL HIGH (ref 4.0–10.5)
nRBC: 0.1 % (ref 0.0–0.2)

## 2019-10-06 LAB — COMPREHENSIVE METABOLIC PANEL
ALT: 24 U/L (ref 0–44)
AST: 17 U/L (ref 15–41)
Albumin: 2.8 g/dL — ABNORMAL LOW (ref 3.5–5.0)
Alkaline Phosphatase: 176 U/L — ABNORMAL HIGH (ref 38–126)
Anion gap: 34 — ABNORMAL HIGH (ref 5–15)
BUN: 53 mg/dL — ABNORMAL HIGH (ref 6–20)
CO2: 18 mmol/L — ABNORMAL LOW (ref 22–32)
Calcium: 10.7 mg/dL — ABNORMAL HIGH (ref 8.9–10.3)
Chloride: 109 mmol/L (ref 98–111)
Creatinine, Ser: 2.08 mg/dL — ABNORMAL HIGH (ref 0.44–1.00)
GFR calc Af Amer: 31 mL/min — ABNORMAL LOW (ref >60)
GFR calc non Af Amer: 26 mL/min — ABNORMAL LOW (ref >60)
Glucose, Bld: 973 mg/dL (ref 70–99)
Potassium: 4.9 mmol/L (ref 3.5–5.1)
Sodium: 161 mmol/L (ref 135–145)
Total Bilirubin: 2.9 mg/dL — ABNORMAL HIGH (ref 0.3–1.2)
Total Protein: 7.8 g/dL (ref 6.5–8.1)

## 2019-10-06 LAB — HEPARIN LEVEL (UNFRACTIONATED)
Heparin Unfractionated: 0.48 IU/mL (ref 0.30–0.70)
Heparin Unfractionated: 0.28 IU/mL — ABNORMAL LOW (ref 0.30–0.70)

## 2019-10-06 LAB — VANCOMYCIN, RANDOM: Vancomycin Rm: 13

## 2019-10-06 LAB — POTASSIUM: Potassium: 4.3 mmol/L (ref 3.5–5.1)

## 2019-10-06 SURGERY — FASCIOTOMY CLOSURE
Anesthesia: General | Site: Leg Lower | Laterality: Right

## 2019-10-06 MED ORDER — DEXAMETHASONE SODIUM PHOSPHATE 10 MG/ML IJ SOLN
INTRAMUSCULAR | Status: DC | PRN
  Administered 2019-10-06: 10 mg via INTRAVENOUS

## 2019-10-06 MED ORDER — INSULIN DETEMIR 100 UNIT/ML ~~LOC~~ SOLN
25.0000 [IU] | Freq: Two times a day (BID) | SUBCUTANEOUS | Status: DC
  Administered 2019-10-06: 25 [IU] via SUBCUTANEOUS
  Filled 2019-10-06 (×2): qty 0.25

## 2019-10-06 MED ORDER — FENTANYL CITRATE (PF) 100 MCG/2ML IJ SOLN
50.0000 ug | INTRAMUSCULAR | Status: DC | PRN
  Administered 2019-10-06 – 2019-10-07 (×7): 100 ug via INTRAVENOUS
  Administered 2019-10-07: 50 ug via INTRAVENOUS
  Administered 2019-10-07 – 2019-10-08 (×9): 100 ug via INTRAVENOUS
  Filled 2019-10-06 (×17): qty 2

## 2019-10-06 MED ORDER — INSULIN ASPART 100 UNIT/ML ~~LOC~~ SOLN
2.0000 [IU] | SUBCUTANEOUS | Status: DC
  Administered 2019-10-06: 6 [IU] via SUBCUTANEOUS
  Administered 2019-10-07 (×3): 4 [IU] via SUBCUTANEOUS
  Administered 2019-10-07: 6 [IU] via SUBCUTANEOUS
  Administered 2019-10-07: 4 [IU] via SUBCUTANEOUS
  Administered 2019-10-07: 2 [IU] via SUBCUTANEOUS
  Administered 2019-10-08: 6 [IU] via SUBCUTANEOUS
  Administered 2019-10-08 – 2019-10-09 (×4): 4 [IU] via SUBCUTANEOUS
  Administered 2019-10-09: 2 [IU] via SUBCUTANEOUS
  Administered 2019-10-09 (×2): 6 [IU] via SUBCUTANEOUS
  Administered 2019-10-09 (×2): 4 [IU] via SUBCUTANEOUS
  Administered 2019-10-10: 2 [IU] via SUBCUTANEOUS
  Administered 2019-10-10: 6 [IU] via SUBCUTANEOUS

## 2019-10-06 MED ORDER — POTASSIUM CHLORIDE 10 MEQ/50ML IV SOLN
10.0000 meq | INTRAVENOUS | Status: DC
  Administered 2019-10-06 (×3): 10 meq via INTRAVENOUS
  Filled 2019-10-06 (×4): qty 50

## 2019-10-06 MED ORDER — 0.9 % SODIUM CHLORIDE (POUR BTL) OPTIME
TOPICAL | Status: DC | PRN
  Administered 2019-10-06: 1000 mL

## 2019-10-06 MED ORDER — LIDOCAINE 2% (20 MG/ML) 5 ML SYRINGE
INTRAMUSCULAR | Status: DC | PRN
  Administered 2019-10-06: 50 mg via INTRAVENOUS

## 2019-10-06 MED ORDER — ALBUTEROL SULFATE HFA 108 (90 BASE) MCG/ACT IN AERS
INHALATION_SPRAY | RESPIRATORY_TRACT | Status: DC | PRN
  Administered 2019-10-06: 4 via RESPIRATORY_TRACT

## 2019-10-06 MED ORDER — FENTANYL CITRATE (PF) 250 MCG/5ML IJ SOLN
INTRAMUSCULAR | Status: AC
  Filled 2019-10-06: qty 5

## 2019-10-06 MED ORDER — DEXAMETHASONE SODIUM PHOSPHATE 10 MG/ML IJ SOLN
INTRAMUSCULAR | Status: AC
  Filled 2019-10-06: qty 1

## 2019-10-06 MED ORDER — LACTATED RINGERS IV SOLN: INTRAVENOUS | Status: DC | PRN

## 2019-10-06 MED ORDER — SUGAMMADEX SODIUM 500 MG/5ML IV SOLN
INTRAVENOUS | Status: DC | PRN
  Administered 2019-10-06: 225 mg via INTRAVENOUS

## 2019-10-06 MED ORDER — ONDANSETRON HCL 4 MG/2ML IJ SOLN
INTRAMUSCULAR | Status: AC
  Filled 2019-10-06: qty 2

## 2019-10-06 MED ORDER — PHENYLEPHRINE HCL-NACL 10-0.9 MG/250ML-% IV SOLN
INTRAVENOUS | Status: DC | PRN
  Administered 2019-10-06: 50 ug/min via INTRAVENOUS

## 2019-10-06 MED ORDER — DEXTROSE 5 % IV SOLN: INTRAVENOUS | Status: DC

## 2019-10-06 MED ORDER — PROPOFOL 10 MG/ML IV BOLUS
INTRAVENOUS | Status: AC
  Filled 2019-10-06: qty 20

## 2019-10-06 MED ORDER — INSULIN DETEMIR 100 UNIT/ML ~~LOC~~ SOLN
0.1000 [IU]/kg | Freq: Two times a day (BID) | SUBCUTANEOUS | Status: DC
  Administered 2019-10-06 – 2019-10-08 (×4): 11 [IU] via SUBCUTANEOUS
  Filled 2019-10-06 (×5): qty 0.11

## 2019-10-06 MED ORDER — ROCURONIUM BROMIDE 10 MG/ML (PF) SYRINGE
PREFILLED_SYRINGE | INTRAVENOUS | Status: AC
  Filled 2019-10-06: qty 20

## 2019-10-06 MED ORDER — PHENYLEPHRINE 40 MCG/ML (10ML) SYRINGE FOR IV PUSH (FOR BLOOD PRESSURE SUPPORT)
PREFILLED_SYRINGE | INTRAVENOUS | Status: DC | PRN
  Administered 2019-10-06 (×2): 120 ug via INTRAVENOUS
  Administered 2019-10-06 (×2): 80 ug via INTRAVENOUS

## 2019-10-06 MED ORDER — SUGAMMADEX SODIUM 500 MG/5ML IV SOLN
INTRAVENOUS | Status: AC
  Filled 2019-10-06: qty 5

## 2019-10-06 MED ORDER — EPHEDRINE 5 MG/ML INJ
INTRAVENOUS | Status: AC
  Filled 2019-10-06: qty 20

## 2019-10-06 MED ORDER — ALBUTEROL SULFATE HFA 108 (90 BASE) MCG/ACT IN AERS
INHALATION_SPRAY | RESPIRATORY_TRACT | Status: AC
  Filled 2019-10-06: qty 6.7

## 2019-10-06 MED ORDER — HEPARIN (PORCINE) 25000 UT/250ML-% IV SOLN: 1400.0000 [IU]/h | INTRAVENOUS | Status: DC

## 2019-10-06 MED ORDER — LACTATED RINGERS IV SOLN: INTRAVENOUS | Status: DC

## 2019-10-06 MED ORDER — MIDAZOLAM HCL 2 MG/2ML IJ SOLN
INTRAMUSCULAR | Status: AC
  Filled 2019-10-06: qty 2

## 2019-10-06 MED ORDER — PROPOFOL 10 MG/ML IV BOLUS
INTRAVENOUS | Status: DC | PRN
  Administered 2019-10-06: 80 mg via INTRAVENOUS

## 2019-10-06 MED ORDER — VANCOMYCIN HCL 750 MG/150ML IV SOLN
750.0000 mg | Freq: Two times a day (BID) | INTRAVENOUS | Status: AC
  Administered 2019-10-06 – 2019-10-11 (×10): 750 mg via INTRAVENOUS
  Filled 2019-10-06 (×12): qty 150

## 2019-10-06 MED ORDER — ROCURONIUM BROMIDE 10 MG/ML (PF) SYRINGE
PREFILLED_SYRINGE | INTRAVENOUS | Status: DC | PRN
  Administered 2019-10-06: 50 mg via INTRAVENOUS

## 2019-10-06 MED ORDER — POTASSIUM CHLORIDE 10 MEQ/50ML IV SOLN
10.0000 meq | INTRAVENOUS | Status: DC
  Filled 2019-10-06: qty 50

## 2019-10-06 MED ORDER — PHENYLEPHRINE 40 MCG/ML (10ML) SYRINGE FOR IV PUSH (FOR BLOOD PRESSURE SUPPORT)
PREFILLED_SYRINGE | INTRAVENOUS | Status: AC
  Filled 2019-10-06: qty 20

## 2019-10-06 MED ORDER — FENTANYL CITRATE (PF) 250 MCG/5ML IJ SOLN
INTRAMUSCULAR | Status: DC | PRN
  Administered 2019-10-06: 50 ug via INTRAVENOUS
  Administered 2019-10-06: 100 ug via INTRAVENOUS

## 2019-10-06 MED ORDER — HEPARIN (PORCINE) 25000 UT/250ML-% IV SOLN
1650.0000 [IU]/h | INTRAVENOUS | Status: DC
  Administered 2019-10-06: 1400 [IU]/h via INTRAVENOUS
  Administered 2019-10-07 – 2019-10-09 (×3): 1500 [IU]/h via INTRAVENOUS
  Filled 2019-10-06 (×8): qty 250

## 2019-10-06 MED ORDER — ONDANSETRON HCL 4 MG/2ML IJ SOLN
INTRAMUSCULAR | Status: DC | PRN
  Administered 2019-10-06: 4 mg via INTRAVENOUS

## 2019-10-06 MED ORDER — LIDOCAINE 2% (20 MG/ML) 5 ML SYRINGE
INTRAMUSCULAR | Status: AC
  Filled 2019-10-06: qty 5

## 2019-10-06 SURGICAL SUPPLY — 33 items
BNDG ELASTIC 4X5.8 VLCR STR LF (GAUZE/BANDAGES/DRESSINGS) IMPLANT
BNDG ELASTIC 6X5.8 VLCR STR LF (GAUZE/BANDAGES/DRESSINGS) IMPLANT
BNDG GAUZE ELAST 4 BULKY (GAUZE/BANDAGES/DRESSINGS) IMPLANT
CANISTER SUCT 3000ML PPV (MISCELLANEOUS) ×2 IMPLANT
COVER WAND RF STERILE (DRAPES) ×2 IMPLANT
DRSG OPSITE 4X5.5 SM (GAUZE/BANDAGES/DRESSINGS) ×1 IMPLANT
DRSG VAC ATS SM SENSATRAC (GAUZE/BANDAGES/DRESSINGS) ×1 IMPLANT
ELECT REM PT RETURN 9FT ADLT (ELECTROSURGICAL) ×2
ELECTRODE REM PT RTRN 9FT ADLT (ELECTROSURGICAL) ×1 IMPLANT
GAUZE SPONGE 4X4 12PLY STRL (GAUZE/BANDAGES/DRESSINGS) ×2 IMPLANT
GLOVE BIO SURGEON STRL SZ7.5 (GLOVE) ×3 IMPLANT
GLOVE BIOGEL PI IND STRL 7.5 (GLOVE) IMPLANT
GLOVE BIOGEL PI INDICATOR 7.5 (GLOVE) ×1
GLOVE SURG SS PI 7.5 STRL IVOR (GLOVE) ×2 IMPLANT
GOWN STRL REUS W/ TWL LRG LVL3 (GOWN DISPOSABLE) ×3 IMPLANT
GOWN STRL REUS W/TWL LRG LVL3 (GOWN DISPOSABLE) ×3
KIT BASIN OR (CUSTOM PROCEDURE TRAY) ×2 IMPLANT
KIT TURNOVER KIT B (KITS) ×2 IMPLANT
NS IRRIG 1000ML POUR BTL (IV SOLUTION) ×2 IMPLANT
PACK GENERAL/GYN (CUSTOM PROCEDURE TRAY) ×2 IMPLANT
PACK UNIVERSAL I (CUSTOM PROCEDURE TRAY) ×2 IMPLANT
PAD ARMBOARD 7.5X6 YLW CONV (MISCELLANEOUS) ×2 IMPLANT
SPONGE LAP 18X18 RF (DISPOSABLE) ×1 IMPLANT
STAPLER VISISTAT 35W (STAPLE) ×2 IMPLANT
SUT ETHILON 3 0 PS 1 (SUTURE) ×3 IMPLANT
SUT VIC AB 2-0 CT1 27 (SUTURE) ×1
SUT VIC AB 2-0 CT1 TAPERPNT 27 (SUTURE) IMPLANT
SUT VIC AB 2-0 CTX 36 (SUTURE) IMPLANT
SUT VIC AB 3-0 SH 27 (SUTURE)
SUT VIC AB 3-0 SH 27X BRD (SUTURE) IMPLANT
SUT VICRYL 4-0 PS2 18IN ABS (SUTURE) ×6 IMPLANT
TOWEL GREEN STERILE (TOWEL DISPOSABLE) ×2 IMPLANT
WATER STERILE IRR 1000ML POUR (IV SOLUTION) ×2 IMPLANT

## 2019-10-06 NOTE — Progress Notes (Signed)
VAST responded to IV consult for multiple PIV's in order to dc central line. Assessed bilateral lower arms utilizing ultrasound; pt has limited appropriate veins for USGIV placement. Due to current medications and fluids pt is receiving, VAST recommends leaving central line in place at this time.

## 2019-10-06 NOTE — Anesthesia Procedure Notes (Signed)
Procedure Name: Intubation Date/Time: 10/06/2019 11:57 AM Performed by: Colin Benton, CRNA Pre-anesthesia Checklist: Patient identified, Emergency Drugs available, Suction available and Patient being monitored Patient Re-evaluated:Patient Re-evaluated prior to induction Oxygen Delivery Method: Circle System Utilized Preoxygenation: Pre-oxygenation with 100% oxygen Induction Type: IV induction Ventilation: Mask ventilation without difficulty Laryngoscope Size: Mac and 3 Grade View: Grade I Tube type: Oral Tube size: 7.0 mm Number of attempts: 1 Airway Equipment and Method: Stylet and Oral airway Placement Confirmation: ETT inserted through vocal cords under direct vision,  positive ETCO2 and breath sounds checked- equal and bilateral Secured at: 22 cm Tube secured with: Tape Dental Injury: Teeth and Oropharynx as per pre-operative assessment

## 2019-10-06 NOTE — Anesthesia Preprocedure Evaluation (Addendum)
Anesthesia Evaluation  Patient identified by MRN, date of birth, ID band Patient awake    Reviewed: Allergy & Precautions, NPO status , Patient's Chart, lab work & pertinent test results  History of Anesthesia Complications Negative for: history of anesthetic complications  Airway Mallampati: I  TM Distance: >3 FB Neck ROM: Full    Dental  (+) Edentulous Upper, Edentulous Lower   Pulmonary Current Smoker,    Pulmonary exam normal        Cardiovascular negative cardio ROS Normal cardiovascular exam Rhythm:Regular Rate:Normal  IMPRESSIONS    1. Left ventricular ejection fraction, by estimation, is 60 to 65%. The  left ventricle has normal function. The left ventricle has no regional  wall motion abnormalities. There is mild left ventricular hypertrophy.  Left ventricular diastolic parameters  are consistent with Grade I diastolic dysfunction (impaired relaxation).  2. Right ventricular systolic function is normal. The right ventricular  size is normal. Tricuspid regurgitation signal is inadequate for assessing  PA pressure.  3. The mitral valve is normal in structure and function. No evidence of  mitral valve regurgitation. No evidence of mitral stenosis.  4. The aortic valve is tricuspid. Aortic valve regurgitation is not  visualized. No aortic stenosis is present.  5. The inferior vena cava is normal in size with greater than 50%  respiratory variability, suggesting right atrial pressure of 3 mmHg.  6. Technically difficult study with poor acoustic windows.    Neuro/Psych PSYCHIATRIC DISORDERS Anxiety Depression Encephalopathic this admission felt due to sepsis negative neurological ROS     GI/Hepatic negative GI ROS, Neg liver ROS,   Endo/Other  negative endocrine ROSdiabetes  Renal/GU negative Renal ROS  negative genitourinary   Musculoskeletal negative musculoskeletal ROS (+)   Abdominal    Peds negative pediatric ROS (+)  Hematology negative hematology ROS (+)   Anesthesia Other Findings   Reproductive/Obstetrics negative OB ROS                            Anesthesia Physical Anesthesia Plan  ASA: III  Anesthesia Plan: General   Post-op Pain Management:    Induction: Intravenous  PONV Risk Score and Plan: 2 and Ondansetron, Dexamethasone and Treatment may vary due to age or medical condition  Airway Management Planned: Oral ETT  Additional Equipment:   Intra-op Plan:   Post-operative Plan: Possible Post-op intubation/ventilation  Informed Consent: I have reviewed the patients History and Physical, chart, labs and discussed the procedure including the risks, benefits and alternatives for the proposed anesthesia with the patient or authorized representative who has indicated his/her understanding and acceptance.     Dental advisory given  Plan Discussed with: CRNA, Anesthesiologist and Surgeon  Anesthesia Plan Comments:        Anesthesia Quick Evaluation

## 2019-10-06 NOTE — Progress Notes (Signed)
Pharmacy Antibiotic Note  [name] is a 54 y.o. female admitted on 10/02/2019 with cellulitis.  Pharmacy has been consulted for Vancomycin dosing. Recent I&D of groin abscess. Currently still draining and a culture has been sent. She also had an ischemic leg which required emergent thrombectomy and 4 fasciotomies. She is returning from closure of these wounds this afternoon.   Her Scr has improved greatly since she was first started on vanc and she is back to baseline at this time. We will change her q24h regimen to a q12h and follow up with levels. Expected AUC of 431 Scr 0.81.   Plan: Vancomycin 750 mg IV q12h  Continue Ceftriaxone 2g IV q24h  Weight: 231 lb 11.3 oz (105.1 kg)  Temp (24hrs), Avg:98.5 F (36.9 C), Min:97.2 F (36.2 C), Max:99.5 F (37.5 C)  Recent Labs  Lab 10/02/19 2241 10/02/19 2241 10/03/19 0405 10/03/19 0405 10/03/19 0430 10/03/19 1255 10/04/19 0428 10/04/19 0428 10/04/19 1230 10/04/19 2104 10/05/19 0219 10/05/19 0350 10/05/19 1252 10/05/19 2010 10/06/19 0403 10/06/19 0902  WBC 21.0*   < > 21.4*  --  22.8*  --  18.0*  --   --   --   --  22.2*  --   --  19.1*  --   CREATININE 2.08*   < > 1.68*   < > 1.26*   < > 0.93   < > 1.01*   < > 0.72  --  0.81 0.71 0.81 0.66  LATICACIDVEN 2.4*  --  2.5*  --   --   --   --   --  2.6*  --   --   --   --   --   --   --   VANCORANDOM  --   --   --   --   --   --   --   --   --   --   --   --   --   --  13  --    < > = values in this interval not displayed.    Estimated Creatinine Clearance: 98.5 mL/min (by C-G formula based on SCr of 0.66 mg/dL).    Allergies  Allergen Reactions  . Codeine   . Guaifenesin Hives   Jettie Pagan, PharmD PGY2 Infectious Disease Pharmacy Resident

## 2019-10-06 NOTE — Progress Notes (Signed)
[facility] ADULT ICU REPLACEMENT PROTOCOL FOR AM LAB REPLACEMENT ONLY  The patient does apply for the [facility] Adult ICU Electrolyte Replacment Protocol based on the criteria listed below:   1. Is GFR >/= 40 ml/min? Yes.    Patient's GFR today is >60 2. Is urine output >/= 0.5 ml/kg/hr for the last 6 hours? Yes.   Patient's UOP is 1.1 ml/kg/hr 3. Is BUN < 60 mg/dL? Yes.    Patient's BUN today is 6 4. Abnormal electrolyte(s): K-3.2 5. Ordered repletion with: per protocol6. If a panic level lab has been reported, has the CCM MD in charge been notified? Yes.  .   Physician:  Dr. Heywood Bene, Dixon Boos 10/06/2019 6:03 AM

## 2019-10-06 NOTE — Progress Notes (Signed)
eLink Physician-Brief Progress Note Patient Name: [name] DOB: Apr 07, 1966 MRN: [id number]   Date of Service  10/06/2019  HPI/Events of Note  Pt s/p DKA which has resolved, Pt is now on a diet and standard scale phase 1 glycemic control but blood sugar 251 mg %  eICU Interventions  Levemir basal coverage added per endotool recommendations, Blood sugar checks changed to [facility] and HS        Khoen Genet U Rion Catala 10/06/2019, 8:07 PM

## 2019-10-06 NOTE — Progress Notes (Signed)
2 Days Post-Op    CC: AMS/sepsis  Subjective: Patient is extubated and on her face mask this morning.  The erythema of her midline pannus/abdominal wall have pretty much completely resolved.  The open labial site was draining a fairly large volume of purulent material when we examined it.  We did get cultures of this today.  It is a creamy white thick purulent fluid.  Objective: Vital signs in last 24 hours: Temp:  [98.2 F (36.8 C)-99.5 F (37.5 C)] 98.4 F (36.9 C) (02/19 0700) Pulse Rate:  [57-87] 77 (02/19 1000) Resp:  [16-30] 17 (02/19 1000) BP: (86-127)/(51-72) 105/57 (02/19 1000) SpO2:  [86 %-100 %] 96 % (02/19 1000) FiO2 (%):  [100 %] 100 % (02/18 2052) Last BM Date: 10/04/19  Intake/Output from previous day: 02/18 0701 - 02/19 0700 In: 3537.4 [I.V.:3195.5; IV Piggyback:341.9] Out: 4003.5 [Urine:3903.5; Drains:100] Intake/Output this shift: Total I/O In: 408.7 [I.V.:306.3; IV Piggyback:102.4] Out: 162.5 [Urine:162.5]  General appearance: alert, cooperative and no distress Resp: Extubated and on facemask satting well.  Voice is clear and she is quite coherent. Cardio: Regular rate and rhythm mild tachycardia. GI: soft, non-tender; bowel sounds normal; no masses,  no organomegaly and Pictures below show the abdominal wall as it is today.  Cellulitis is completely resolved. The lower picture shows the open labial I&D site.  When we examined this it was putting out a fair volume of purulent creamy colored fluid.  The tract goes about 5 to 6 cm cephalad.  We did culture it today.      Lab Results:  Recent Labs    10/05/19 0350 10/06/19 0403  WBC 22.2* 19.1*  HGB 11.6* 11.1*  HCT 38.3 35.7*  PLT 129* 97*    BMET Recent Labs    10/06/19 0403 10/06/19 0902  NA 150* 147*  K 3.2* 6.0*  CL 114* 113*  CO2 27 27  GLUCOSE 156* 112*  BUN 6 7  CREATININE 0.81 0.66  CALCIUM 8.1* 8.0*   PT/INR Recent Labs    10/04/19 0428  LABPROT 18.3*  INR 1.5*     Recent Labs  Lab 10/02/19 2241 10/04/19 1230  AST 17 59*  ALT 24 25  ALKPHOS 176* 124  BILITOT 2.9* 0.8  PROT 7.8 5.6*  ALBUMIN 2.8* 2.0*     Lipase  No results found for: LIPASE   Medications: . arformoterol  15 mcg Nebulization BID  . budesonide (PULMICORT) nebulizer solution  0.5 mg Nebulization BID  . Chlorhexidine Gluconate Cloth  6 each Topical Daily  . insulin aspart  2-6 Units Subcutaneous Q4H  . insulin detemir  25 Units Subcutaneous Q12H  . mouth rinse  15 mL Mouth Rinse BID  . pantoprazole (PROTONIX) IV  40 mg Intravenous Q24H    Assessment/Plan Altered mental status/syncope Uncontrolled type 2 diabetes Hyper osmolar nonketotic hyperglycemia Delirium Hypernatremia Acute kidney disease-creatinine 1.68 Severe dehydration Acute RLE ishcemia  - RLE thrombectomy with 4 compartment fasciotomies, 10/04/19 Dr. Lemar Livings Yeast UTI  Sepsis Perineal infection with I&D in ED 09/28/2019 Abdominal wall cellulitis Leukocytosis  FEN: IV fluids/n.p.o. ID: Maxipime 2/15 x 1; Diflucan x 12/16; Rocephin 2/16>>day4; vancomycin 2/15>> day 4 TMH:[id number] drip Follow-up: TBD  Plan: From our standpoint she is looking much better.  The drainage that is coming out of the labial I&D was is creamy white purulent stuff it looks like Candida; she does have a yeast UTI.  She has had 1 dose of Diflucan.  She looks much better on the  vancomycin and Rocephin.  Pharmacy is going to watch for the culture and Gram stain.  At this point local wound care for the open site, and appropriate antibiotics.     LOS: 3 days    Laxmi Choung 10/06/2019 Please see Amion

## 2019-10-06 NOTE — Anesthesia Postprocedure Evaluation (Signed)
Anesthesia Post Note  Patient: Cecile Gillispie Ninh  Procedure(s) Performed: FASCIOTOMY CLOSURE Right Lateral Lower leg. (Right Leg Lower) Application Of Wound Vac (Right Leg Lower)     Patient location during evaluation: PACU Anesthesia Type: General Level of consciousness: awake and alert Pain management: pain level controlled Vital Signs Assessment: post-procedure vital signs reviewed and stable Respiratory status: spontaneous breathing, nonlabored ventilation, respiratory function stable and non-rebreather facemask Cardiovascular status: blood pressure returned to baseline and stable Postop Assessment: no apparent nausea or vomiting Anesthetic complications: no    Last Vitals:  Vitals:   10/06/19 1315 10/06/19 1330  BP: (!) 131/101 (!) 123/58  Pulse: 87 76  Resp: (!) 29 (!) 24  Temp: (!) 36.2 C   SpO2: (!) 88% 93%    Last Pain:  Vitals:   10/06/19 1315  TempSrc:   PainSc: 0-No pain                 Kennieth Rad

## 2019-10-06 NOTE — Transfer of Care (Signed)
Immediate Anesthesia Transfer of Care Note  Patient: [name]  Procedure(s) Performed: FASCIOTOMY CLOSURE Right Lateral Lower leg. (Right Leg Lower) Application Of Wound Vac (Right Leg Lower)  Patient Location: PACU  Anesthesia Type:General  Level of Consciousness: awake, alert , oriented and patient cooperative  Airway & Oxygen Therapy: Patient Spontanous Breathing, non-rebreather face mask and at 15LPM.  Patient arrived to pre-op on 15LPM non-rebreather so patient back at baseline.  Post-op Assessment: Report given to RN and Post -op Vital signs reviewed and stable  Post vital signs: Reviewed and stable  Last Vitals:  Vitals Value Taken Time  BP 131/101 10/06/19 1316  Temp    Pulse 81 10/06/19 1321  Resp 22 10/06/19 1321  SpO2 91 % 10/06/19 1321  Vitals shown include unvalidated device data.  Last Pain:  Vitals:   10/06/19 1008  TempSrc:   PainSc: Asleep      Patients Stated Pain Goal: 5 (10/06/19 1610)  Complications: No apparent anesthesia complications

## 2019-10-06 NOTE — Progress Notes (Signed)
NAME:  [name], MRN:  [id number], DOB:  02-May-1966, LOS: 3 ADMISSION DATE:  10/02/2019, CONSULTATION DATE:  2/16 REFERRING MD:  Dr. Sunnie Nielsen, CHIEF COMPLAINT:  Hypernatremia, encephalopathy   History of present illness   54 year old female with PMH as below, which is significant for DM and asthma. She was recently 2/10 evaluated in [facility] long emergency department with complaints of perineal abscess.  This was drained and packed in the emergency department as the patient refused admission to the hospital at that time.  Then, in the evening hours of 2/15 she presented to [facility] emergency department after being found down by her son at home.  It is unknown how long she was down.  Upon EMS arrival she was noted to be confused, Tachypneic, and hypoxemic to 88% on room air.  Upon arrival to the emergency department she was noted to have profoundly elevated levels of serum glucose and sodium.  She was treated for DKA versus HHS with IV fluid administration and insulin infusion.  CT scan of the abdomen was done to further evaluate the extent of the perineal abscess.  This showed gas in the right labia with extension of infection to the abdominal wall and rectus muscle.  She has been evaluated by general surgery in the emergency department who recommend IV antibiotics alone at this time.  Because of ongoing encephalopathy PCCM was asked to evaluate for admission to ICU.   Past Medical History   has a past medical history of Allergy, Anxiety, Asthma, Depression, and Diabetes mellitus without complication (HCC).   Significant Hospital Events   2/15 admit  2/16 urine 2/17 to OR. Right leg pain. For  Right lower extremity thromboembolectomyExposure right popliteal and anterior tibial, peroneal, posterior tibial arteries  4 compartment fasciotomies right lower extremity  Placement of negative pressure dressing to 4 compartment fasciotomies. 2/19 back to OR for wound vac change  Consults:   Surgery  Procedures:  2/10 I?D perineal abscess.  2/17 1.  Exposure right common femoral, sfa and profunda femoris arteries 2.  Right lower extremity thromboembolectomy 3.  Exposure right popliteal and anterior tibial, peroneal, posterior tibial arteries 4.  4 compartment fasciotomies right lower extremity 5.  Placement of negative pressure dressing to 4 compartment fasciotomies.  Significant Diagnostic Tests:  CT abdomen/pelvis 2/15 > Soft tissue gas in the right labia and perineum. Labial induration on CT 5 days ago has improved. There is new subcutaneous induration and inflammatory changes involving the right anterior abdominal wall with ill-defined fluid measuring approximately 6.1 x 2.2 cm.  Micro Data:  Flu/Covid negative on admission Blood 2/15 >  Antimicrobials:  Clindamycin 2/10 > 2/15 Cefepime 2/15 Flagyl 2/15  Vancomycin 2/15 >>> Ceftriaxone 2/15 >>>  Interim history/subjective:  Still reports RLQ and right leg pain   Objective   Blood pressure (Abnormal) 115/51, pulse 81, temperature 98.4 F (36.9 C), temperature source Axillary, resp. rate (Abnormal) 23, weight 105.1 kg, SpO2 96 %.    FiO2 (%):  [55 %-100 %] 100 %   Intake/Output Summary (Last 24 hours) at 10/06/2019 0826 Last data filed at 10/06/2019 0800 Gross per 24 hour  Intake 3552.29 ml  Output 3850 ml  Net -297.71 ml   Filed Weights   10/02/19 2222 10/04/19 0500  Weight: 112 kg 105.1 kg    Examination:  General this is a 54 year old female who is resting in bed. She is not in acute distress this am but continues to c/o primarily RLQ  pain from abd HENT NCAT no JVD. MMM pulm diffuse wheeze and scattered rhonchi. Currently on 100% NRB. resp not labored w/ this  Card RRR no MRG abd soft. Tender to palp over RLQ. + bowel sounds. Old incisional site in RLQ Ext RLE wound vac in place. Her LLE is erythremic.   Neuro awake. Oriented. Moves all ext  GU cl yellow   Resolved Hospital Problem list    Yeast UTI-->rx'd  Shock->mix of sepsis and sedation   Assessment & Plan:   Diabetic ketoacidosis, severe hypernatremia DM: poorly controlled historically, A1c 15 on admission Glucose fairly well controlled currently  Plan Cont basal and SSI  Adjusted lantus given IVF change to D5W  Acute Hypoxic respiratory failure -still requiring high FIO2 via NRB mask s/p extubation.  PCXR personally reviewed: low volume. Hazy bibasilar changes. R>L.  Looks like may be mix of edema and effusion. Also  ?atx. Was not present on CT scan Plan Supplemental oxygen IS Pulse ox eval for diuresis after water balance a little better  Sepsis secondary to cellulitis and perineal abscess. Abscess was drained 2/10. Has been on clindamycin since 2/10. CT abdomen shows gas in the R labia and extension of infection into soft tissue of the lower abdomen.  -all cultures treated to date  -wbc trending down; still has sig RLQ pain and induration at abd fold Plan Cont CTX and vanc-->stop date TBD; surgery says no surgery indicated. Will need to re-image again  Trend cbc  Right femoral clot with RLE compartment syndrome, rhabdo - s/p embolectomy and fasciotomies 2/17, plan for OR tomorrow tentatively Plan Heparin per vasc surg/pharmacy For OR today for on-going wound management   Severe fluid and electrolyte imbalance: hypernatremia, hypokalemia, hyperchloremia  -water def ~3.5 LITERS Plan Change IVFs to D5W at 70cc/hr Replace K Repeat chem q 12  Dysphagia Plan Re-eval post op  Acute metabolic encephalopathy:  In the setting of DKA, sepsis-->improving. Currently Na not helping Plan rx pain Correct water imbalance  Supportive care    Best practice:  Diet: NPO Pain/Anxiety/Delirium protocol (if indicated):wean after extubation VAP protocol (if indicated): NA DVT prophylaxis: heparin gtt GI prophylaxis: PPI for now Glucose control: see above Mobility: BR Code Status: FULL Family  Communication: Updated yesterday, will try to reach out today Disposition:  ICU   My cct 34 minutes.,   Erick Colace ACNP-BC [place] Pager # [phone] OR # [phone] if no answer

## 2019-10-06 NOTE — Progress Notes (Addendum)
ANTICOAGULATION CONSULT NOTE - Follow Up Consult  Pharmacy Consult for heparin Indication: DVT  Labs: Recent Labs    10/04/19 0428 10/04/19 1230 10/04/19 1830 10/04/19 2104 10/05/19 0350 10/05/19 0941 10/05/19 1825 10/05/19 2010 10/06/19 0150 10/06/19 0403 10/06/19 0828 10/06/19 0902 10/06/19 1555 10/06/19 1925 10/06/19 2300  HGB 15.6*   < > 11.5*   < > 11.6*  --   --   --   --  11.1*  --   --   --   --   --   HCT 50.2*   < > 37.3  --  38.3  --   --   --   --  35.7*  --   --   --   --   --   PLT 178  --   --   --  129*  --   --   --   --  97*  --   --   --   --   --   LABPROT 18.3*  --   --   --   --   --   --   --   --   --   --   --   --   --   --   INR 1.5*  --   --   --   --   --   --   --   --   --   --   --   --   --   --   HEPARINUNFRC  --   --   --   --   --    < > 0.39  --   --  0.48  --   --   --   --  0.28*  CREATININE 0.93   < >  --    < >  --    < >  --    < >  --  0.81  --  0.66 0.70  --   --   CKTOTAL  --    < > 2,890*   < >  --    < >  --    < >   < >  --  3,320*  --  2,742* 2,111*  --    < > = values in this interval not displayed.    Assessment/Plan:  54yo female slightly subtherapeutic on heparin after resumed though this lab was drawn just 5h after resumed and likely needs more time to accumulate. Will continue gtt at current rate and confirm with am labs.   Vernard Gambles, PharmD, BCPS  10/06/2019,11:59 PM   Addendum: Heparin level now below goal at 0.18.  No infusion pauses or issue per RN.  Will increase heparin gtt slightly to 1500 units/hr and check level in 6hr.  VB 10/07/2019 4:17 AM

## 2019-10-06 NOTE — Op Note (Signed)
    NAME: [name]    MRN: [id number] DOB: November 06, 1965    DATE OF OPERATION: 10/06/2019  PREOP DIAGNOSIS:    Status post 4 compartment fasciotomy right leg  POSTOP DIAGNOSIS:    Same  PROCEDURE:    Closure of lateral fasciotomy site Partial closure of medial fasciotomy site and placement of VAC  SURGEON: Di Kindle. Edilia Bo, MD  ASSIST: None  ANESTHESIA: General  EBL: Minimal  INDICATIONS:    [name] is a 54 y.o. female who presented with an ischemic right lower extremity and required 4 compartment fasciotomy.  I was asked to try to close these incisions today.  FINDINGS:   I was able to close the lateral incision.  I was able to close 60 to 70% of the medial incision.  A VAC was placed on the lower aspect of the medial incision.  This wound measured 11 cm in length by 3-1/2 cm in width  TECHNIQUE:   The patient was taken to the operating room and received a general anesthetic.  The right leg was prepped and draped in usual sterile fashion.  Attention was first turned to the lateral aspect of the leg.  The wound was irrigated with saline and hemostasis obtained using electrocautery.  I then closed the deep layer with a running 3-0 Vicryl.  I closed the skin with staples.  Attention was then turned to the medial leg.  The wound was irrigated with saline.  Hemostasis was obtained using electrocautery.  I closed the deep layer with 2-0 Vicryl at the superior aspect.  I made a second layer of 3-0 Vicryl and then the skin was closed with staples at the proximal aspect of the incision.  Distally there was too much tension to complete the closure.  The remaining wound measured 11 cm in length and 3-1/2 cm in width.  A VAC dressing was applied here.  There was a good seal.  Patient tolerated procedure well and will be transferred back to the medical ICU in stable condition.  Waverly Ferrari, MD, FACS [facility]  DATE OF DICTATION:    10/06/2019

## 2019-10-06 NOTE — Interval H&P Note (Signed)
History and Physical Interval Note:  10/06/2019 11:34 AM  [name]  has presented today for surgery, with the diagnosis of post fasciotomy.  The various methods of treatment have been discussed with the patient and family. After consideration of risks, benefits and other options for treatment, the patient has consented to  Procedure(s): FASCIOTOMY CLOSURE (Right) as a surgical intervention.  The patient's history has been reviewed, patient examined, no change in status, stable for surgery.  I have reviewed the patient's chart and labs.  Questions were answered to the patient's satisfaction.     Waverly Ferrari

## 2019-10-07 ENCOUNTER — Inpatient Hospital Stay (HOSPITAL_COMMUNITY): Payer: Self-pay

## 2019-10-07 LAB — CBC WITH DIFFERENTIAL/PLATELET
Abs Immature Granulocytes: 0.22 10*3/uL — ABNORMAL HIGH (ref 0.00–0.07)
Basophils Absolute: 0.1 10*3/uL (ref 0.0–0.1)
Basophils Relative: 0 %
Eosinophils Absolute: 0 10*3/uL (ref 0.0–0.5)
Eosinophils Relative: 0 %
HCT: 35.7 % — ABNORMAL LOW (ref 36.0–46.0)
Hemoglobin: 11.1 g/dL — ABNORMAL LOW (ref 12.0–15.0)
Immature Granulocytes: 1 %
Lymphocytes Relative: 8 %
Lymphs Abs: 1.2 10*3/uL (ref 0.7–4.0)
MCH: 30 pg (ref 26.0–34.0)
MCHC: 31.1 g/dL (ref 30.0–36.0)
MCV: 96.5 fL (ref 80.0–100.0)
Monocytes Absolute: 0.4 10*3/uL (ref 0.1–1.0)
Monocytes Relative: 3 %
Neutro Abs: 13.5 10*3/uL — ABNORMAL HIGH (ref 1.7–7.7)
Neutrophils Relative %: 88 %
Platelets: 88 10*3/uL — ABNORMAL LOW (ref 150–400)
RBC: 3.7 MIL/uL — ABNORMAL LOW (ref 3.87–5.11)
RDW: 13.2 % (ref 11.5–15.5)
WBC: 15.4 10*3/uL — ABNORMAL HIGH (ref 4.0–10.5)
nRBC: 0 % (ref 0.0–0.2)

## 2019-10-07 LAB — GLUCOSE, CAPILLARY
Glucose-Capillary: 125 mg/dL — ABNORMAL HIGH (ref 70–99)
Glucose-Capillary: 156 mg/dL — ABNORMAL HIGH (ref 70–99)
Glucose-Capillary: 159 mg/dL — ABNORMAL HIGH (ref 70–99)
Glucose-Capillary: 171 mg/dL — ABNORMAL HIGH (ref 70–99)
Glucose-Capillary: 184 mg/dL — ABNORMAL HIGH (ref 70–99)
Glucose-Capillary: 199 mg/dL — ABNORMAL HIGH (ref 70–99)

## 2019-10-07 LAB — CK: Total CK: 1118 U/L — ABNORMAL HIGH (ref 38–234)

## 2019-10-07 LAB — BASIC METABOLIC PANEL
Anion gap: 10 (ref 5–15)
Anion gap: 9 (ref 5–15)
BUN: 5 mg/dL — ABNORMAL LOW (ref 6–20)
BUN: 7 mg/dL (ref 6–20)
CO2: 27 mmol/L (ref 22–32)
CO2: 28 mmol/L (ref 22–32)
Calcium: 8.2 mg/dL — ABNORMAL LOW (ref 8.9–10.3)
Calcium: 8.5 mg/dL — ABNORMAL LOW (ref 8.9–10.3)
Chloride: 110 mmol/L (ref 98–111)
Chloride: 110 mmol/L (ref 98–111)
Creatinine, Ser: 0.66 mg/dL (ref 0.44–1.00)
Creatinine, Ser: 0.74 mg/dL (ref 0.44–1.00)
GFR calc Af Amer: >60 mL/min (ref >60)
GFR calc Af Amer: >60 mL/min (ref >60)
GFR calc non Af Amer: >60 mL/min (ref >60)
GFR calc non Af Amer: >60 mL/min (ref >60)
Glucose, Bld: 168 mg/dL — ABNORMAL HIGH (ref 70–99)
Glucose, Bld: 189 mg/dL — ABNORMAL HIGH (ref 70–99)
Potassium: 3.8 mmol/L (ref 3.5–5.1)
Potassium: 3.6 mmol/L (ref 3.5–5.1)
Sodium: 147 mmol/L — ABNORMAL HIGH (ref 135–145)
Sodium: 147 mmol/L — ABNORMAL HIGH (ref 135–145)

## 2019-10-07 LAB — CULTURE, BLOOD (ROUTINE X 2)
Culture: NO GROWTH
Culture: NO GROWTH
Special Requests: ADEQUATE

## 2019-10-07 LAB — HEPARIN LEVEL (UNFRACTIONATED)
Heparin Unfractionated: 0.18 IU/mL — ABNORMAL LOW (ref 0.30–0.70)
Heparin Unfractionated: 0.37 IU/mL (ref 0.30–0.70)
Heparin Unfractionated: 0.43 IU/mL (ref 0.30–0.70)

## 2019-10-07 MED ORDER — FLUCONAZOLE IN SODIUM CHLORIDE 200-0.9 MG/100ML-% IV SOLN
200.0000 mg | Freq: Once | INTRAVENOUS | Status: AC
  Administered 2019-10-07: 200 mg via INTRAVENOUS
  Filled 2019-10-07: qty 100

## 2019-10-07 MED ORDER — DEXTROSE 5 % IV SOLN
500.0000 mg | Freq: Once | INTRAVENOUS | Status: AC
  Administered 2019-10-07: 500 mg via INTRAVENOUS
  Filled 2019-10-07: qty 500

## 2019-10-07 MED ORDER — OXYCODONE HCL 5 MG PO TABS
5.0000 mg | ORAL_TABLET | ORAL | Status: DC | PRN
  Administered 2019-10-07 – 2019-10-08 (×4): 10 mg via ORAL
  Administered 2019-10-08: 5 mg via ORAL
  Administered 2019-10-09 (×2): 10 mg via ORAL
  Filled 2019-10-07 (×2): qty 2
  Filled 2019-10-07: qty 1
  Filled 2019-10-07 (×4): qty 2

## 2019-10-07 MED ORDER — POTASSIUM CHLORIDE 20 MEQ/15ML (10%) PO SOLN
40.0000 meq | Freq: Once | ORAL | Status: AC
  Administered 2019-10-07: 40 meq via ORAL
  Filled 2019-10-07: qty 30

## 2019-10-07 MED ORDER — OXYCODONE HCL 5 MG PO TABS: 10.0000 mg | ORAL_TABLET | ORAL | Status: DC | PRN

## 2019-10-07 NOTE — Progress Notes (Signed)
NAME:  ANNELISA RYBACK, MRN:  [id number], DOB:  12/08/1965, LOS: 4 ADMISSION DATE:  10/02/2019, CONSULTATION DATE:  2/16 REFERRING MD:  Dr. Tyrell Antonio, CHIEF COMPLAINT:  Hypernatremia, encephalopathy   History of present illness   54 year old female with PMH as below, which is significant for DM and asthma. She was recently 2/10 evaluated in [facility] long emergency department with complaints of perineal abscess.  This was drained and packed in the emergency department as the patient refused admission to the hospital at that time.  Then, in the evening hours of 2/15 she presented to [facility] emergency department after being found down by her son at home.  It is unknown how long she was down.  Upon EMS arrival she was noted to be confused, Tachypneic, and hypoxemic to 88% on room air.  Upon arrival to the emergency department she was noted to have profoundly elevated levels of serum glucose and sodium.  She was treated for DKA versus HHS with IV fluid administration and insulin infusion.  CT scan of the abdomen was done to further evaluate the extent of the perineal abscess.  This showed gas in the right labia with extension of infection to the abdominal wall and rectus muscle.  She has been evaluated by general surgery in the emergency department who recommend IV antibiotics alone at this time.  Because of ongoing encephalopathy PCCM was asked to evaluate for admission to ICU.   Past Medical History   has a past medical history of Allergy, Anxiety, Asthma, Depression, and Diabetes mellitus without complication ([place]).   Significant Hospital Events   2/15 admit  2/16 urine 2/17 to OR. Right leg pain. For  Right lower extremity thromboembolectomyExposure right popliteal and anterior tibial, peroneal, posterior tibial arteries  4 compartment fasciotomies right lower extremity  Placement of negative pressure dressing to 4 compartment fasciotomies. 2/19 back to OR for wound vac change  Consults:  Surgery   Procedures:  2/10 I?D perineal abscess.  2/17 1.  Exposure right common femoral, sfa and profunda femoris arteries 2.  Right lower extremity thromboembolectomy 3.  Exposure right popliteal and anterior tibial, peroneal, posterior tibial arteries 4.  4 compartment fasciotomies right lower extremity 5.  Placement of negative pressure dressing to 4 compartment fasciotomies.  Significant Diagnostic Tests:  CT abdomen/pelvis 2/15 > Soft tissue gas in the right labia and perineum. Labial induration on CT 5 days ago has improved. There is new subcutaneous induration and inflammatory changes involving the right anterior abdominal wall with ill-defined fluid measuring approximately 6.1 x 2.2 cm.  Micro Data:  Flu/Covid negative on admission Blood 2/15 >  Antimicrobials:  Clindamycin 2/10 > 2/15 Cefepime 2/15 Flagyl 2/15  Vancomycin 2/15 >>> Ceftriaxone 2/15 >>>  Interim history/subjective:  Still reports RLQ and right leg pain   Objective   Blood pressure (!) 106/56, pulse 68, temperature 97.8 F (36.6 C), temperature source Oral, resp. rate 16, weight 105.1 kg, SpO2 95 %.    FiO2 (%):  [100 %] 100 %   Intake/Output Summary (Last 24 hours) at 10/07/2019 0819 Last data filed at 10/07/2019 0700 Gross per 24 hour  Intake 5634.73 ml  Output 2170 ml  Net 3464.73 ml   Filed Weights   10/02/19 2222 10/04/19 0500  Weight: 112 kg 105.1 kg    Examination:  GEN: middle age woman in NAD HEENT: MMM, trachea midline, NGT in place CV: RRR, ext warm PULM: Less wheezing today GI: Soft, +BS EXT: No edema, RLE wound  vac in place NEURO: Moves all 4 ext to command PSYCH: More and more oriented each day SKIN: vaginal incision site with mostly serous output, abdominal redness continues to improve   Resolved Hospital Problem list   Yeast UTI-->rx'd  Shock->mix of sepsis and sedation   Assessment & Plan:   Diabetic ketoacidosis, severe hypernatremia DM: poorly controlled historically,  A1c 15 on admission Glucose fairly well controlled currently  Plan Cont basal and SSI   Acute Hypoxic respiratory failure -I think related to de-recruitment and crystalloid administration- atelectasis and edema Plan Supplemental oxygen IS Stop IVF Dose of diuril Continue nebs, seem to be helping  Sepsis secondary to cellulitis and perineal abscess. Abscess was drained 2/10. Has been on clindamycin since 2/10. CT abdomen shows gas in the R labia and extension of infection into soft tissue of the lower abdomen.  -all cultures treated to date  -wbc trending down; still has sig RLQ pain and induration at abd fold Plan Ceftriaxone and vanc, duration TBD, wound culture sent 2/19, will follow that up Another dose of fluconazole given ongoing evidence of yeast infection Local wound care  Right femoral clot with RLE compartment syndrome, rhabdo - s/p embolectomy and fasciotomies 2/17, partial closure 2/19, to return 2/21  Heparin per vasc surg/pharmacy Continue wound vac OR tomorrow for potential close of remaining incisions  Severe fluid and electrolyte imbalance: hypernatremia, hypokalemia, hyperchloremia  -water def ~3.5 LITERS Plan Replace K Liberalize diet Encourage PO water Dose of diuril  Dysphagia Plan Mechanical soft Remove feeding tube  Keep in ICU 1 more day given high O2 requirements, tomorrow after OR Myrla Halsted MD PCCM

## 2019-10-07 NOTE — Progress Notes (Addendum)
ANTICOAGULATION CONSULT NOTE - Follow Up Consult  Pharmacy Consult for Heparin Indication: DVT/Ischemic leg  Allergies  Allergen Reactions  . Codeine   . Guaifenesin Hives    Patient Measurements: Weight: 231 lb 11.3 oz (105.1 kg) Heparin Dosing Weight: 83 kg  Vital Signs: Temp: 97.6 F (36.4 C) (02/20 1155) Temp Source: Oral (02/20 1155) BP: 105/51 (02/20 1400) Pulse Rate: 77 (02/20 1400)  Labs: Recent Labs    10/05/19 0350 10/05/19 0941 10/06/19 0403 10/06/19 0403 10/06/19 0828 10/06/19 0902 10/06/19 1555 10/06/19 1925 10/06/19 2300 10/07/19 0304 10/07/19 0305 10/07/19 1035  HGB 11.6*  --  11.1*  --   --   --   --   --   --  11.1*  --   --   HCT 38.3  --  35.7*  --   --   --   --   --   --  35.7*  --   --   PLT 129*  --  97*  --   --   --   --   --   --  88*  --   --   HEPARINUNFRC  --    < > 0.48   < >  --   --   --   --  0.28*  --  0.18* 0.37  CREATININE  --    < > 0.81  --    < > 0.66 0.70  --   --  0.66  --   --   CKTOTAL  --    < >  --   --    < >  --  2,742* 2,111*  --  1,118*  --   --    < > = values in this interval not displayed.    Estimated Creatinine Clearance: 98.5 mL/min (by C-G formula based on SCr of 0.66 mg/dL).   Assessment: 54 year old female on IV Heparin for ischemic leg, R femoral clot with RLE compartment syndrome - now s/p RLE thromboembolectomy and 4 compartment fasciotomies 2/17, partial closure 2/19, with plan to return to the OR 2/21  Heparin level therapeutic at 0.37 after rate increased (targeting lower goal with recent procedure). Mild anemia - stable, plt trending down to 88 (321 on 2/15).   Goal of Therapy:  Heparin level 0.3 to 0.5 units/ml due to recent surgery Monitor platelets by anticoagulation protocol: Yes   Plan:  Continue heparin at 1500 units/hr 6h heparin level to confirm Monitor daily heparin level and CBC, s/sx bleeding - watch platelet trend closely (MD aware) Plan return to OR 2/21   Babs Bertin,  PharmD, BCPS Please check AMION for all [facility] contact numbers Clinical Pharmacist 10/07/2019 2:38 PM    17:00 pm: confirm level therapeutic, no change in rate Thank you Okey Regal, PharmD

## 2019-10-07 NOTE — Progress Notes (Signed)
   VASCULAR SURGERY ASSESSMENT & PLAN:   POD 3 S/P RIGHT LOWER EXTREMITY THROMBOEMBOLECTOMY AND 4 COMPARTMENT FASCIOTOMY: She has good Doppler signals in the right foot.  POD 1  S/P CLOSURE OF FASCIOTOMIES AND PLACEMENT OF VAC: The lateral incision was completely closed.  All but 11 cm of the medial incision was closed.  For VAC change on Monday.  ANTICOAGULATION: The patient is on IV heparin.  PHYSICAL THERAPY: Okay to begin physical therapy from a vascular standpoint.  ID: The patient is on Rocephin and vancomycin.   SUBJECTIVE:   No complaints this morning.  PHYSICAL EXAM:   Vitals:   10/07/19 0317 10/07/19 0402 10/07/19 0500 10/07/19 0600  BP:  (!) 104/59 109/63 102/63  Pulse:  67 69 70  Resp:  19 14 17   Temp: 97.9 F (36.6 C)     TempSrc: Axillary     SpO2:  97% 97% 93%  Weight:       The VAC has a good seal. She has a brisk anterior tibial dorsalis pedis signal on the right and also a monophasic posterior tibial signal. She can move her right foot a little better.  LABS:   Lab Results  Component Value Date   WBC 15.4 (H) 10/07/2019   HGB 11.1 (L) 10/07/2019   HCT 35.7 (L) 10/07/2019   MCV 96.5 10/07/2019   PLT 88 (L) 10/07/2019   Lab Results  Component Value Date   CREATININE 0.66 10/07/2019   CBG (last 3)  Recent Labs    10/06/19 1931 10/06/19 2352 10/07/19 0309  GLUCAP 251* 201* 159*    PROBLEM LIST:    Active Problems:   Mood disorder (HCC)   Type 2 diabetes mellitus without complication (HCC)   Abscess of deep perineal space   Sepsis (HCC)   Uncontrolled type 2 diabetes mellitus with hyperosmolar nonketotic hyperglycemia (HCC)   Delirium   Hypernatremia   CURRENT MEDS:   . arformoterol  15 mcg Nebulization BID  . budesonide (PULMICORT) nebulizer solution  0.5 mg Nebulization BID  . Chlorhexidine Gluconate Cloth  6 each Topical Daily  . insulin aspart  2-6 Units Subcutaneous Q4H  . insulin detemir  0.1 Units/kg Subcutaneous BID  .  mouth rinse  15 mL Mouth Rinse BID  . pantoprazole (PROTONIX) IV  40 mg Intravenous Q24H    10/09/19 Office: [phone] 10/07/2019

## 2019-10-08 ENCOUNTER — Other Ambulatory Visit: Payer: Self-pay

## 2019-10-08 LAB — GLUCOSE, CAPILLARY
Glucose-Capillary: 86 mg/dL (ref 70–99)
Glucose-Capillary: 106 mg/dL — ABNORMAL HIGH (ref 70–99)
Glucose-Capillary: 161 mg/dL — ABNORMAL HIGH (ref 70–99)
Glucose-Capillary: 191 mg/dL — ABNORMAL HIGH (ref 70–99)
Glucose-Capillary: 222 mg/dL — ABNORMAL HIGH (ref 70–99)

## 2019-10-08 LAB — BASIC METABOLIC PANEL
Anion gap: 8 (ref 5–15)
BUN: 11 mg/dL (ref 6–20)
CO2: 28 mmol/L (ref 22–32)
Calcium: 8.3 mg/dL — ABNORMAL LOW (ref 8.9–10.3)
Chloride: 110 mmol/L (ref 98–111)
Creatinine, Ser: 0.65 mg/dL (ref 0.44–1.00)
GFR calc Af Amer: >60 mL/min (ref >60)
GFR calc non Af Amer: >60 mL/min (ref >60)
Glucose, Bld: 96 mg/dL (ref 70–99)
Potassium: 3.4 mmol/L — ABNORMAL LOW (ref 3.5–5.1)
Sodium: 146 mmol/L — ABNORMAL HIGH (ref 135–145)

## 2019-10-08 LAB — CBC WITH DIFFERENTIAL/PLATELET
Abs Immature Granulocytes: 0.21 10*3/uL — ABNORMAL HIGH (ref 0.00–0.07)
Basophils Absolute: 0 10*3/uL (ref 0.0–0.1)
Basophils Relative: 0 %
Eosinophils Absolute: 0.2 10*3/uL (ref 0.0–0.5)
Eosinophils Relative: 2 %
HCT: 35.9 % — ABNORMAL LOW (ref 36.0–46.0)
Hemoglobin: 11.3 g/dL — ABNORMAL LOW (ref 12.0–15.0)
Immature Granulocytes: 2 %
Lymphocytes Relative: 24 %
Lymphs Abs: 3.3 10*3/uL (ref 0.7–4.0)
MCH: 30.5 pg (ref 26.0–34.0)
MCHC: 31.5 g/dL (ref 30.0–36.0)
MCV: 96.8 fL (ref 80.0–100.0)
Monocytes Absolute: 0.7 10*3/uL (ref 0.1–1.0)
Monocytes Relative: 5 %
Neutro Abs: 9.1 10*3/uL — ABNORMAL HIGH (ref 1.7–7.7)
Neutrophils Relative %: 67 %
Platelets: 109 10*3/uL — ABNORMAL LOW (ref 150–400)
RBC: 3.71 MIL/uL — ABNORMAL LOW (ref 3.87–5.11)
RDW: 13.2 % (ref 11.5–15.5)
WBC: 13.6 10*3/uL — ABNORMAL HIGH (ref 4.0–10.5)
nRBC: 0.1 % (ref 0.0–0.2)

## 2019-10-08 LAB — MAGNESIUM: Magnesium: 2 mg/dL (ref 1.7–2.4)

## 2019-10-08 LAB — HEPARIN LEVEL (UNFRACTIONATED): Heparin Unfractionated: 0.5 IU/mL (ref 0.30–0.70)

## 2019-10-08 LAB — PHOSPHORUS: Phosphorus: 3.1 mg/dL (ref 2.5–4.6)

## 2019-10-08 MED ORDER — DEXTROSE 5 % IV SOLN
500.0000 mg | Freq: Once | INTRAVENOUS | Status: DC
  Filled 2019-10-08: qty 500

## 2019-10-08 MED ORDER — INSULIN DETEMIR 100 UNIT/ML ~~LOC~~ SOLN
6.0000 [IU] | Freq: Every day | SUBCUTANEOUS | Status: AC
  Administered 2019-10-08: 6 [IU] via SUBCUTANEOUS
  Filled 2019-10-08 (×2): qty 0.06

## 2019-10-08 MED ORDER — INSULIN DETEMIR 100 UNIT/ML ~~LOC~~ SOLN
0.1000 [IU]/kg | Freq: Two times a day (BID) | SUBCUTANEOUS | Status: DC
  Administered 2019-10-09 – 2019-10-10 (×3): 11 [IU] via SUBCUTANEOUS
  Filled 2019-10-08 (×4): qty 0.11

## 2019-10-08 MED ORDER — METOLAZONE 5 MG PO TABS
5.0000 mg | ORAL_TABLET | Freq: Once | ORAL | Status: AC
  Administered 2019-10-08: 5 mg via ORAL
  Filled 2019-10-08: qty 1

## 2019-10-08 MED ORDER — SERTRALINE HCL 50 MG PO TABS
150.0000 mg | ORAL_TABLET | Freq: Every day | ORAL | Status: DC
  Administered 2019-10-08 – 2019-10-12 (×5): 150 mg via ORAL
  Filled 2019-10-08 (×5): qty 1

## 2019-10-08 MED ORDER — PANTOPRAZOLE SODIUM 40 MG PO TBEC
40.0000 mg | DELAYED_RELEASE_TABLET | Freq: Every day | ORAL | Status: DC
  Administered 2019-10-09 – 2019-10-12 (×4): 40 mg via ORAL
  Filled 2019-10-08 (×4): qty 1

## 2019-10-08 MED ORDER — POTASSIUM CHLORIDE CRYS ER 20 MEQ PO TBCR
40.0000 meq | EXTENDED_RELEASE_TABLET | Freq: Two times a day (BID) | ORAL | Status: AC
  Administered 2019-10-08 (×2): 40 meq via ORAL
  Filled 2019-10-08 (×2): qty 2

## 2019-10-08 NOTE — Progress Notes (Signed)
PT Cancellation Note  Patient Details Name: [name] MRN: [id number] DOB: [birth year]   Cancelled Treatment:    Reason Eval/Treat Not Completed: Active bedrest order   Van Clines, PT  Acute Rehabilitation Services Pager [phone] Office [phone]    [name] 10/08/2019, 7:02 AM

## 2019-10-08 NOTE — Progress Notes (Signed)
   VASCULAR SURGERY ASSESSMENT & PLAN:   POD 4 S/P RIGHT LOWER EXTREMITY THROMBOEMBOLECTOMY AND 4 COMPARTMENT FASCIOTOMY: She has good Doppler signals in the right foot.  POD 2  S/P CLOSURE OF FASCIOTOMIES AND PLACEMENT OF VAC: The lateral incision was completely closed.  All but 11 cm of the medial incision was closed.  For [facility] change tomorrow.   ANTICOAGULATION: The patient is on IV heparin.  I will discuss plans for oral anticoagulation with Dr. Randie Heinz.  PHYSICAL THERAPY: Okay to begin physical therapy from a vascular standpoint.  This was ordered this morning.   SUBJECTIVE:   No complaints.  Doing very well.  PHYSICAL EXAM:   Vitals:   10/08/19 0400 10/08/19 0500 10/08/19 0600 10/08/19 0700  BP: 101/60 (!) 99/59 122/63   Pulse: 68 72 74   Resp: 19 13 17    Temp:    (!) 97.5 F (36.4 C)  TempSrc:    Oral  SpO2: 96% 98% 97%   Weight:      Height:       The right lateral fasciotomy incision is closed and looks fine.  The VAC on the medial lower incision has a good seal. The right foot is warm and well-perfused with good Doppler signals.  LABS:   Lab Results  Component Value Date   WBC 13.6 (H) 10/08/2019   HGB 11.3 (L) 10/08/2019   HCT 35.9 (L) 10/08/2019   MCV 96.8 10/08/2019   PLT 109 (L) 10/08/2019   CBG (last 3)  Recent Labs    10/07/19 2349 10/08/19 0349 10/08/19 0706  GLUCAP 156* 86 106*    PROBLEM LIST:    Active Problems:   Mood disorder (HCC)   Type 2 diabetes mellitus without complication (HCC)   Abscess of deep perineal space   Sepsis (HCC)   Uncontrolled type 2 diabetes mellitus with hyperosmolar nonketotic hyperglycemia (HCC)   Delirium   Hypernatremia   CURRENT MEDS:   . arformoterol  15 mcg Nebulization BID  . budesonide (PULMICORT) nebulizer solution  0.5 mg Nebulization BID  . Chlorhexidine Gluconate Cloth  6 each Topical Daily  . insulin aspart  2-6 Units Subcutaneous Q4H  . insulin detemir  0.1 Units/kg Subcutaneous BID  .  mouth rinse  15 mL Mouth Rinse BID  . pantoprazole (PROTONIX) IV  40 mg Intravenous Q24H    10/10/19 Office: [phone] 10/08/2019

## 2019-10-08 NOTE — Progress Notes (Signed)
NAME:  [name], MRN:  [id number], DOB:  1966/07/01, LOS: 5 ADMISSION DATE:  10/02/2019, CONSULTATION DATE:  2/16 REFERRING MD:  Dr. Tyrell Antonio, CHIEF COMPLAINT:  Hypernatremia, encephalopathy   History of present illness   54 year old female with PMH as below, which is significant for DM and asthma. She was recently 2/10 evaluated in [facility] emergency department with complaints of perineal abscess.  This was drained and packed in the emergency department as the patient refused admission to the hospital at that time.  Then, in the evening hours of 2/15 she presented to [facility] emergency department after being found down by her son at home.  It is unknown how long she was down.  Upon EMS arrival she was noted to be confused, Tachypneic, and hypoxemic to 88% on room air.  Upon arrival to the emergency department she was noted to have profoundly elevated levels of serum glucose and sodium.  She was treated for DKA versus HHS with IV fluid administration and insulin infusion.  CT scan of the abdomen was done to further evaluate the extent of the perineal abscess.  This showed gas in the right labia with extension of infection to the abdominal wall and rectus muscle.  She has been evaluated by general surgery in the emergency department who recommend IV antibiotics alone at this time.  Because of ongoing encephalopathy PCCM was asked to evaluate for admission to ICU.   Past Medical History   has a past medical history of Allergy, Anxiety, Asthma, Depression, and Diabetes mellitus without complication ([place]).   Significant Hospital Events   2/15 admit  2/16 urine 2/17 to OR. Right leg pain. For  Right lower extremity thromboembolectomyExposure right popliteal and anterior tibial, peroneal, posterior tibial arteries  4 compartment fasciotomies right lower extremity  Placement of negative pressure dressing to 4 compartment fasciotomies. 2/19 back to OR for wound vac change  Consults:  Surgery   Procedures:  2/10 I?D perineal abscess.  2/17 1.  Exposure right common femoral, sfa and profunda femoris arteries 2.  Right lower extremity thromboembolectomy 3.  Exposure right popliteal and anterior tibial, peroneal, posterior tibial arteries 4.  4 compartment fasciotomies right lower extremity 5.  Placement of negative pressure dressing to 4 compartment fasciotomies.  2/19 Closure of lateral fasciotomy site Partial closure of medial fasciotomy site and placement of VAC  Significant Diagnostic Tests:  CT abdomen/pelvis 2/15 > Soft tissue gas in the right labia and perineum. Labial induration on CT 5 days ago has improved. There is new subcutaneous induration and inflammatory changes involving the right anterior abdominal wall with ill-defined fluid measuring approximately 6.1 x 2.2 cm.  Micro Data:  Flu/Covid negative on admission Blood 2/15 >  Antimicrobials:  Clindamycin 2/10 > 2/15 Cefepime 2/15 Flagyl 2/15  Vancomycin 2/15 >>> Ceftriaxone 2/15 >>>  Interim history/subjective:  In better spirits today. Pain better controlled. Breathing easier.  Objective   Blood pressure (!) 112/93, pulse 72, temperature (!) 97.5 F (36.4 C), temperature source Oral, resp. rate 16, height 5\' 6"  (1.676 m), weight 103.2 kg, SpO2 94 %.        Intake/Output Summary (Last 24 hours) at 10/08/2019 0848 Last data filed at 10/08/2019 0800 Gross per 24 hour  Intake 1696.3 ml  Output 1950 ml  Net -253.7 ml   Filed Weights   10/02/19 2222 10/04/19 0500 10/08/19 0323  Weight: 112 kg 105.1 kg 103.2 kg    Examination:  GEN: middle age woman in NAD  HEENT: MMM, trachea midline, NGT in place CV: RRR, ext warm PULM: Clear, no accessory muscle use GI: Soft, +BS EXT: No edema, RLE wound vac in place, sites look clear, RLE pulse CDI NEURO: Moves all 4 ext to command PSYCH: AOx3, good insight SKIN: vaginal incision site with mostly serous output, abdominal redness continues to improve  (2/20)   Resolved Hospital Problem list   Yeast UTI-->rx'd  Shock->mix of sepsis and sedation   Assessment & Plan:   Diabetic ketoacidosis, severe hypernatremia DM: poorly controlled historically, A1c 15 on admission Glucose fairly well controlled currently  Plan Cont basal and SSI ; half dose tonight for OR tomorrow  Acute Hypoxic respiratory failure -I think related to de-recruitment and crystalloid administration- atelectasis and edema Plan Improved Another dose of diuril IS Wean O2, down to 3L Continue nebs, seem to be helping  Sepsis secondary to cellulitis and perineal abscess. Abscess was drained 2/10. Has been on clindamycin since 2/10. CT abdomen shows gas in the R labia and extension of infection into soft tissue of the lower abdomen.  -all cultures treated to date  -wbc trending down; infection improving Plan -Ceftriaxone and vanc, duration 10 days (stop date placed), wound culture sent 2/19, will follow that up -Another dose of fluconazole given on 2/20 ongoing evidence of yeast infection -Local wound care  Right femoral clot with RLE compartment syndrome, rhabdo - s/p embolectomy and fasciotomies 2/17, partial closure 2/19, to return 2/21  -Heparin per vasc surg/pharmacy; potential transition to NoAC -Continue wound vac -OR tomorrow for potential close of remaining incision  Severe fluid and electrolyte imbalance: hypernatremia, hypokalemia, hyperchloremia  Plan Replace K Liberalize diet Encourage PO water Dose of diuril  Dysphagia Plan Mechanical soft   Okay for floor, appreciate TR[facility] taking over care Myrla Halsted MD PCCM

## 2019-10-08 NOTE — Progress Notes (Addendum)
ANTICOAGULATION CONSULT NOTE - Follow Up Consult  Pharmacy Consult for Heparin Indication: DVT/Ischemic leg  Allergies  Allergen Reactions  . Codeine   . Guaifenesin Hives    Patient Measurements: Height: 5\' 6"  (167.6 cm) Weight: 227 lb 8.2 oz (103.2 kg) IBW/kg (Calculated) : 59.3 Heparin Dosing Weight: 83 kg  Vital Signs: Temp: 97.5 F (36.4 C) (02/21 0700) Temp Source: Oral (02/21 0700) BP: 112/93 (02/21 0800) Pulse Rate: 72 (02/21 0800)  Labs: Recent Labs     0000 10/06/19 0403 10/06/19 0828 10/06/19 1555 10/06/19 1925 10/06/19 2300 10/07/19 0304 10/07/19 0305 10/07/19 1035 10/07/19 1622 10/08/19 0351  HGB   < > 11.1*  --   --   --   --  11.1*  --   --   --  11.3*  HCT  --  35.7*  --   --   --   --  35.7*  --   --   --  35.9*  PLT  --  97*  --   --   --   --  88*  --   --   --  109*  HEPARINUNFRC  --  0.48  --   --   --    < >  --    < > 0.37 0.43 0.50  CREATININE  --  0.81   < > 0.70  --   --  0.66  --   --  0.74  --   CKTOTAL  --   --    < > 2,742* 2,111*  --  1,118*  --   --   --   --    < > = values in this interval not displayed.    Estimated Creatinine Clearance: 97.6 mL/min (by C-G formula based on SCr of 0.74 mg/dL).   Assessment: 54 year old female on IV Heparin for ischemic leg, R femoral clot with RLE compartment syndrome - now s/p RLE thromboembolectomy and 4 compartment fasciotomies 2/17, partial closure 2/19, with plan to return to the OR 2/22.  Heparin level remains therapeutic at 0.5. Mild anemia - stable, platelets trend back up to 109.   Goal of Therapy:  Heparin level 0.3 to 0.7 units/ml Monitor platelets by anticoagulation protocol: Yes   Plan:  Continue heparin at 1500 units/hr Monitor daily heparin level and CBC, s/sx bleeding Plan return to OR 2/22   3/22, PharmD, BCPS Please check AMION for all [facility] contact numbers Clinical Pharmacist 10/08/2019 8:50 AM

## 2019-10-09 LAB — CBC WITH DIFFERENTIAL/PLATELET
Abs Immature Granulocytes: 0.4 10*3/uL — ABNORMAL HIGH (ref 0.00–0.07)
Basophils Absolute: 0.1 10*3/uL (ref 0.0–0.1)
Basophils Relative: 1 %
Eosinophils Absolute: 0.2 10*3/uL (ref 0.0–0.5)
Eosinophils Relative: 1 %
HCT: 37.1 % (ref 36.0–46.0)
Hemoglobin: 12.1 g/dL (ref 12.0–15.0)
Immature Granulocytes: 3 %
Lymphocytes Relative: 18 %
Lymphs Abs: 2.4 10*3/uL (ref 0.7–4.0)
MCH: 30.6 pg (ref 26.0–34.0)
MCHC: 32.6 g/dL (ref 30.0–36.0)
MCV: 93.7 fL (ref 80.0–100.0)
Monocytes Absolute: 1 10*3/uL (ref 0.1–1.0)
Monocytes Relative: 7 %
Neutro Abs: 9.6 10*3/uL — ABNORMAL HIGH (ref 1.7–7.7)
Neutrophils Relative %: 70 %
Platelets: 115 10*3/uL — ABNORMAL LOW (ref 150–400)
RBC: 3.96 MIL/uL (ref 3.87–5.11)
RDW: 12.8 % (ref 11.5–15.5)
WBC: 13.6 10*3/uL — ABNORMAL HIGH (ref 4.0–10.5)
nRBC: 0.3 % — ABNORMAL HIGH (ref 0.0–0.2)

## 2019-10-09 LAB — GLUCOSE, CAPILLARY
Glucose-Capillary: 120 mg/dL — ABNORMAL HIGH (ref 70–99)
Glucose-Capillary: 140 mg/dL — ABNORMAL HIGH (ref 70–99)
Glucose-Capillary: 169 mg/dL — ABNORMAL HIGH (ref 70–99)
Glucose-Capillary: 175 mg/dL — ABNORMAL HIGH (ref 70–99)
Glucose-Capillary: 176 mg/dL — ABNORMAL HIGH (ref 70–99)
Glucose-Capillary: 204 mg/dL — ABNORMAL HIGH (ref 70–99)
Glucose-Capillary: 206 mg/dL — ABNORMAL HIGH (ref 70–99)
Glucose-Capillary: 213 mg/dL — ABNORMAL HIGH (ref 70–99)

## 2019-10-09 LAB — BASIC METABOLIC PANEL
Anion gap: 12 (ref 5–15)
BUN: 8 mg/dL (ref 6–20)
CO2: 26 mmol/L (ref 22–32)
Calcium: 8.6 mg/dL — ABNORMAL LOW (ref 8.9–10.3)
Chloride: 104 mmol/L (ref 98–111)
Creatinine, Ser: 0.68 mg/dL (ref 0.44–1.00)
GFR calc Af Amer: >60 mL/min (ref >60)
GFR calc non Af Amer: >60 mL/min (ref >60)
Glucose, Bld: 165 mg/dL — ABNORMAL HIGH (ref 70–99)
Potassium: 3.6 mmol/L (ref 3.5–5.1)
Sodium: 142 mmol/L (ref 135–145)

## 2019-10-09 LAB — HEPARIN LEVEL (UNFRACTIONATED)
Heparin Unfractionated: 0.24 IU/mL — ABNORMAL LOW (ref 0.30–0.70)
Heparin Unfractionated: 0.39 IU/mL (ref 0.30–0.70)

## 2019-10-09 LAB — MAGNESIUM: Magnesium: 1.8 mg/dL (ref 1.7–2.4)

## 2019-10-09 LAB — PHOSPHORUS: Phosphorus: 3.8 mg/dL (ref 2.5–4.6)

## 2019-10-09 MED ORDER — LIVING WELL WITH DIABETES BOOK
Freq: Once | Status: DC
  Filled 2019-10-09: qty 1

## 2019-10-09 MED ORDER — INSULIN STARTER KIT- PEN NEEDLES (ENGLISH)
1.0000 | Freq: Once | Status: DC
  Filled 2019-10-09: qty 1

## 2019-10-09 NOTE — Progress Notes (Signed)
PROGRESS NOTE    [name]  KPT:[id number] DOB: 1966-07-17 DOA: 10/02/2019 PCP: Patient, No Pcp Per    Brief Narrative:  54 year old female with PMH of type 2 diabetes mellitus, asthma who was recently seen on 2/10 in [facility] ED for perineal abscess that was drained and packed but the patient declined admission.  She was brought to [facility] ED on 2/15 after being found down by her son at home with unknown downtime.  Upon EMS arrival, she was confused, tachypneic, hypoxic to 88% on room air and found to have blood glucose elevated in the 900s and hypernatremic on presentation to the ED.  CT abdomen pelvis showed gas in the right labia with extension of infection to the abdominal wall and rectus muscle for which surgery was consulted and recommended IV antibiotics alone with no surgical intervention.  She was started on IV insulin and antibiotics.  Initially admitted to the hospitalist service and subsequently transferred to the ICU for worsening mentation which ultimately required intubation on 2/17 for airway protection.  She was found to have profoundly ischemic right lower extremity on 2/17 for which she was started on heparin drip and taken to the OR for right lower extremity thrombectomy and fasciotomies by vascular surgery emergently.  Went back to the OR on 2/19 for wound VAC change.  Extubated 2/18 to HFNC and ultimately weaned to oxygen via nasal cannula 2-3 L.  Transitioned off IV insulin with mentation much improved.  TRH assumed care on 10/09/2019.   Assessment & Plan:   Active Problems:   Mood disorder (HCC)   Type 2 diabetes mellitus without complication (HCC)   Abscess of deep perineal space   Sepsis ([place])   Uncontrolled type 2 diabetes mellitus with hyperosmolar nonketotic hyperglycemia (HCC)   Delirium   Hypernatremia  Sepsis secondary to perineal abscess/abdominal wall cellulitis - S/p I&D on 2/10, no cultures taken - On clindamycin from 2/10-2/15 as outpatient - CT  abdomen pelvis on 2/15 showed gas in the right labia and extension of infection in the soft tissue of lower abdomen - Surgery on board-recommends IV antibiotics with no surgical intervention -Blood cultures 2/15 no growth in 5 days - On ceftriaxone, vancomycin to complete a total of 10 days (end date 2/24).  - Deep wound cultures on 2/19 grew Klebsiella pneumoniae resistant to ampicillin, ampicillin/sulbactam-otherwise sensitive   - Will continue vancomycin for now given her complicated hospital course. - Wound care  Acute hypoxic respiratory failure - Intubated on 2/17 for airway protection, extubated 2/18 to HFNC - Subsequent oxygen requirements thought to be in setting of derecruitment and volume overload - Since has been weaned to 2 to 3 L oxygen via nasal cannula - Continue nebulizers - Incentive spirometer - Urine output ~6400 cc in 24 hours with diuresis.  Will hold off on diuresis today.  Type 2 diabetes mellitus, poorly controlled Presented with DKA/severe hypernatremia which has since resolved - Hemoglobin A1c 15 on admission - On basal insulin, SSI.  Adjust dose as needed  Right femoral artery clot with RLE compartment syndrome, rhabdomyolysis - S/p embolectomy/fasciotomies 2/17, partial closure 2/19 - Anticoagulation per vascular surgery, currently on heparin drip - Wound VAC in place  Mood disorder - Continue sertraline   Urine culture from 2/16 growing > 100,000 CFU yeast from in and out cath sample Received 150 mg fluconazole per tube on 2/16 and 200 mg IV fluconazole on 2/20 Given labial wound, will keep Foley catheter in place but switch to a  new one if it has not been done so since 2/16  DVT prophylaxis: QA:SUORVIF drip Code Status: Full code  Family Communication:  Disposition Plan: Pending medical stability, admitted from home.  TOC on board.  Likely discharge home with home health services.   Consultants:   General surgery, vascular surgery, transferred  from PCCM  Procedures:  2/17: RLE thromboembolectomy, 4 fasciotomies  2/19: Closure of lateral fasciotomy site, partial closure of medial fasciotomy site, placement of VAC  Antimicrobials:  Anti-infectives (From admission, onward)   Start     Dose/Rate Route Frequency Ordered Stop   10/07/19 0900  fluconazole (DIFLUCAN) IVPB 200 mg     200 mg 100 mL/hr over 60 Minutes Intravenous  Once 10/07/19 0858 10/07/19 1134   10/06/19 1800  vancomycin (VANCOREADY) IVPB 750 mg/150 mL     750 mg 150 mL/hr over 60 Minutes Intravenous Every 12 hours 10/06/19 1409 10/11/19 2359   10/03/19 2200  vancomycin (VANCOCIN) IVPB 1000 mg/200 mL premix  Status:  Discontinued     1,000 mg 200 mL/hr over 60 Minutes Intravenous Every 24 hours 10/03/19 0430 10/06/19 1408   10/03/19 1800  fluconazole (DIFLUCAN) 40 MG/ML suspension 152 mg     150 mg Per Tube  Once 10/03/19 1645 10/03/19 1831   10/03/19 0600  cefTRIAXone (ROCEPHIN) 2 g in sodium chloride 0.9 % 100 mL IVPB     2 g 200 mL/hr over 30 Minutes Intravenous Every 24 hours 10/03/19 0404 10/11/19 2359   10/03/19 0415  vancomycin (VANCOCIN) IVPB 1000 mg/200 mL premix  Status:  Discontinued     1,000 mg 200 mL/hr over 60 Minutes Intravenous  Once 10/03/19 0404 10/03/19 0405   10/02/19 2315  ceFEPIme (MAXIPIME) 2 g in sodium chloride 0.9 % 100 mL IVPB     2 g 200 mL/hr over 30 Minutes Intravenous  Once 10/02/19 2306 10/03/19 0002   10/02/19 2315  metroNIDAZOLE (FLAGYL) IVPB 500 mg     500 mg 100 mL/hr over 60 Minutes Intravenous  Once 10/02/19 2306 10/03/19 0100   10/02/19 2315  vancomycin (VANCOCIN) IVPB 1000 mg/200 mL premix  Status:  Discontinued     1,000 mg 200 mL/hr over 60 Minutes Intravenous  Once 10/02/19 2306 10/02/19 2310   10/02/19 2315  vancomycin (VANCOREADY) IVPB 2000 mg/400 mL     2,000 mg 200 mL/hr over 120 Minutes Intravenous  Once 10/02/19 2310 10/03/19 0244          Subjective: Doing fine.  Sitting on the chair.  Wants to go  back to bed.  No complaints.  Objective: Vitals:   10/08/19 2143 10/09/19 0538 10/09/19 0752 10/09/19 1100  BP: 97/71 (!) 130/59    Pulse: 83 92    Resp: '18 18 20   ' Temp: 98.6 F (37 C) 99.1 F (37.3 C)    TempSrc: Oral Oral    SpO2: 93% 91%  93%  Weight:      Height:        Intake/Output Summary (Last 24 hours) at 10/09/2019 1301 Last data filed at 10/09/2019 0945 Gross per 24 hour  Intake 1123.85 ml  Output 6000 ml  Net -4876.15 ml   Filed Weights   10/02/19 2222 10/04/19 0500 10/08/19 0323  Weight: 112 kg 105.1 kg 103.2 kg    Examination:  General exam: Appears calm and comfortable  Respiratory system: Clear to auscultation. Respiratory effort normal. Cardiovascular system: S1 & S2 heard, RRR. No JVD, murmurs.  RLE wound VAC in place  gastrointestinal system: Abdomen is nondistended, tender right lower abdomen, erythema much improved based on prior images Central nervous system: Alert and oriented.  Somewhat slow to respond but answers questions appropriately Extremities: Using bilateral upper extremities voluntarily, bilateral pedal pulses palpable Skin: No rashes, lesions or ulcers Psychiatry: Depressed   Data Reviewed: I have personally reviewed following labs and imaging studies  CBC: Recent Labs  Lab 10/05/19 0350 10/06/19 0403 10/07/19 0304 10/08/19 0351 10/09/19 0314  WBC 22.2* 19.1* 15.4* 13.6* 13.6*  NEUTROABS 19.8* 15.5* 13.5* 9.1* 9.6*  HGB 11.6* 11.1* 11.1* 11.3* 12.1  HCT 38.3 35.7* 35.7* 35.9* 37.1  MCV 99.2 97.8 96.5 96.8 93.7  PLT 129* 97* 88* 109* 349*   Basic Metabolic Panel: Recent Labs  Lab 10/04/19 0428 10/04/19 1230 10/06/19 1555 10/07/19 0304 10/07/19 1622 10/08/19 0351 10/09/19 0314  NA 163*   < > 148* 147* 147* 146* 142  K 3.3*   < > 4.1 3.8 3.6 3.4* 3.6  CL 120*   < > 113* 110 110 110 104  CO2 28   < > '27 27 28 28 26  ' GLUCOSE 215*   < > 212* 168* 189* 96 165*  BUN 26*   < > 8 5* '7 11 8  ' CREATININE 0.93   < > 0.70 0.66  0.74 0.65 0.68  CALCIUM 8.7*   < > 8.1* 8.2* 8.5* 8.3* 8.6*  MG 2.1  --   --   --   --  2.0 1.8  PHOS  --   --   --   --   --  3.1 3.8   < > = values in this interval not displayed.   GFR: Estimated Creatinine Clearance: 97.6 mL/min (by C-G formula based on SCr of 0.68 mg/dL). Liver Function Tests: Recent Labs  Lab 10/02/19 2241 10/04/19 1230  AST 17 59*  ALT 24 25  ALKPHOS 176* 124  BILITOT 2.9* 0.8  PROT 7.8 5.6*  ALBUMIN 2.8* 2.0*   No results for input(s): LIPASE, AMYLASE in the last 168 hours. Recent Labs  Lab 10/02/19 2241  AMMONIA 32   Coagulation Profile: Recent Labs  Lab 10/04/19 0428  INR 1.5*   Cardiac Enzymes: Recent Labs  Lab 10/06/19 0150 10/06/19 0828 10/06/19 1555 10/06/19 1925 10/07/19 0304  CKTOTAL 3,658* 3,320* 2,742* 2,111* 1,118*   BNP (last 3 results) No results for input(s): PROBNP in the last 8760 hours. HbA1C: No results for input(s): HGBA1C in the last 72 hours. CBG: Recent Labs  Lab 10/09/19 0008 10/09/19 0442 10/09/19 0733 10/09/19 1224 10/09/19 1225  GLUCAP 175* 169* 140* 204* 206*   Lipid Profile: No results for input(s): CHOL, HDL, LDLCALC, TRIG, CHOLHDL, LDLDIRECT in the last 72 hours. Thyroid Function Tests: No results for input(s): TSH, T4TOTAL, FREET4, T3FREE, THYROIDAB in the last 72 hours. Anemia Panel: No results for input(s): VITAMINB12, FOLATE, FERRITIN, TIBC, IRON, RETICCTPCT in the last 72 hours. Sepsis Labs: Recent Labs  Lab 10/02/19 2241 10/03/19 0405 10/03/19 0430 10/04/19 1230  PROCALCITON  --   --  0.89  --   LATICACIDVEN 2.4* 2.5*  --  2.6*    Recent Results (from the past 240 hour(s))  Blood culture (routine x 2)     Status: None   Collection Time: 10/02/19 10:41 PM   Specimen: BLOOD  Result Value Ref Range Status   Specimen Description BLOOD SITE NOT SPECIFIED  Final   Special Requests   Final    BOTTLES DRAWN AEROBIC AND ANAEROBIC Blood  Culture adequate volume Performed at [facility], [place] [address] [place] [address] [place], [place] [address]    Culture NO GROWTH 5 DAYS  Final   Report Status 10/07/2019 FINAL  Final  Respiratory Panel by RT PCR (Flu A&B, Covid) - Nasopharyngeal Swab     Status: None   Collection Time: 10/02/19 10:46 PM   Specimen: Nasopharyngeal Swab  Result Value Ref Range Status   SARS Coronavirus 2 by RT PCR NEGATIVE NEGATIVE Final    Comment: (NOTE) SARS-CoV-2 target nucleic acids are NOT DETECTED. The SARS-CoV-2 RNA is generally detectable in upper respiratoy specimens during the acute phase of infection. The lowest concentration of SARS-CoV-2 viral copies this assay can detect is 131 copies/mL. A negative result does not preclude SARS-Cov-2 infection and should not be used as the sole basis for treatment or other patient management decisions. A negative result may occur with  improper specimen collection/handling, submission of specimen other than nasopharyngeal swab, presence of viral mutation(s) within the areas targeted by this assay, and inadequate number of viral copies (<131 copies/mL). A negative result must be combined with clinical observations, patient history, and epidemiological information. The expected result is Negative. Fact Sheet for Patients:  PinkCheek.be Fact Sheet for Healthcare Providers:  GravelBags.it This test is not yet ap proved or cleared by the [place] FDA and  has been authorized for detection and/or diagnosis of SARS-CoV-2 by FDA under an Emergency Use Authorization (EUA). This EUA will remain  in effect (meaning this test can be used) for the duration of the COVID-19 declaration under Section 564(b)(1) of the Act, 21 U.S.C. section 360bbb-3(b)(1), unless the authorization is terminated or revoked sooner.    Influenza A by PCR NEGATIVE NEGATIVE Final   Influenza B by PCR NEGATIVE NEGATIVE Final    Comment: (NOTE) The Xpert Xpress SARS-CoV-2/FLU/RSV  assay is intended as an aid in  the diagnosis of influenza from Nasopharyngeal swab specimens and  should not be used as a sole basis for treatment. Nasal washings and  aspirates are unacceptable for Xpert Xpress SARS-CoV-2/FLU/RSV  testing. Fact Sheet for Patients: PinkCheek.be Fact Sheet for Healthcare Providers: GravelBags.it This test is not yet approved or cleared by the [place] FDA and  has been authorized for detection and/or diagnosis of SARS-CoV-2 by  FDA under an Emergency Use Authorization (EUA). This EUA will remain  in effect (meaning this test can be used) for the duration of the  Covid-19 declaration under Section 564(b)(1) of the Act, 21  U.S.C. section 360bbb-3(b)(1), unless the authorization is  terminated or revoked. Performed at [facility], [place] [address], [place], [place] [address]   Blood culture (routine x 2)     Status: None   Collection Time: 10/02/19 11:21 PM   Specimen: BLOOD LEFT HAND  Result Value Ref Range Status   Specimen Description BLOOD LEFT HAND  Final   Special Requests   Final    BOTTLES DRAWN AEROBIC AND ANAEROBIC Blood Culture results may not be optimal due to an inadequate volume of blood received in culture bottles Performed at [place] [address], [place], [place] [address]    Culture NO GROWTH 5 DAYS  Final   Report Status 10/07/2019 FINAL  Final  Urine culture     Status: Abnormal   Collection Time: 10/03/19  1:39 AM   Specimen: In/Out Cath Urine  Result Value Ref Range Status   Specimen Description IN/OUT CATH URINE  Final   Special  Requests   Final    NONE Performed at [facility], [place] [address] [place], [place] [address]    Culture >=100,000 COLONIES/mL YEAST (A)  Final   Report Status 10/04/2019 FINAL  Final  MRSA PCR Screening     Status: None   Collection Time: 10/03/19  8:42 PM   Specimen: Nasopharyngeal  Result Value Ref Range  Status   MRSA by PCR NEGATIVE NEGATIVE Final    Comment:        The GeneXpert MRSA Assay (FDA approved for NASAL specimens only), is one component of a comprehensive MRSA colonization surveillance program. It is not intended to diagnose MRSA infection nor to guide or monitor treatment for MRSA infections. Performed at [facility], [place] [address] [place], [place] [address]   Surgical pcr screen     Status: None   Collection Time: 10/06/19  8:42 AM   Specimen: Nasal Mucosa; Nasal Swab  Result Value Ref Range Status   MRSA, PCR NEGATIVE NEGATIVE Final   Staphylococcus aureus NEGATIVE NEGATIVE Final    Comment: (NOTE) The Xpert SA Assay (FDA approved for NASAL specimens in patients 66 years of age and older), is one component of a comprehensive surveillance program. It is not intended to diagnose infection nor to guide or monitor treatment. Performed at [facility], [place] [address] [place], [place] [address]   Aerobic/Anaerobic Culture (surgical/deep wound)     Status: None (Preliminary result)   Collection Time: 10/06/19 11:02 AM   Specimen: Labia  Result Value Ref Range Status   Specimen Description LABIA  Final   Special Requests FLUID PER RN, RUN TEST REGARDLESS  Final   Gram Stain   Final    ABUNDANT WBC PRESENT,BOTH PMN AND MONONUCLEAR FEW GRAM POSITIVE COCCI FEW GRAM VARIABLE ROD    Culture   Final    FEW VIRIDANS STREPTOCOCCUS RARE KLEBSIELLA PNEUMONIAE NO ANAEROBES ISOLATED; CULTURE IN PROGRESS FOR 5 DAYS CULTURE REINCUBATED FOR BETTER GROWTH Performed at [facility], [place] [address] [place], [place] [address]    Report Status PENDING  Incomplete   Organism ID, Bacteria KLEBSIELLA PNEUMONIAE  Final      Susceptibility   Klebsiella pneumoniae - MIC*    AMPICILLIN >=32 RESISTANT Resistant     CEFAZOLIN <=4 SENSITIVE Sensitive     CEFEPIME <=0.12 SENSITIVE Sensitive     CEFTAZIDIME <=1 SENSITIVE Sensitive     CEFTRIAXONE <=0.25 SENSITIVE  Sensitive     CIPROFLOXACIN <=0.25 SENSITIVE Sensitive     GENTAMICIN <=1 SENSITIVE Sensitive     IMIPENEM <=0.25 SENSITIVE Sensitive     TRIMETH/SULFA <=20 SENSITIVE Sensitive     AMPICILLIN/SULBACTAM 16 INTERMEDIATE Intermediate     PIP/TAZO 8 SENSITIVE Sensitive     * RARE KLEBSIELLA PNEUMONIAE         Radiology Studies: No results found.      Scheduled Meds: . arformoterol  15 mcg Nebulization BID  . budesonide (PULMICORT) nebulizer solution  0.5 mg Nebulization BID  . Chlorhexidine Gluconate Cloth  6 each Topical Daily  . insulin aspart  2-6 Units Subcutaneous Q4H  . insulin detemir  0.1 Units/kg Subcutaneous BID  . insulin starter kit- pen needles  1 kit Other Once  . living well with diabetes book   Does not apply Once  . mouth rinse  15 mL Mouth Rinse BID  . pantoprazole  40 mg Oral Daily  . sertraline  150 mg Oral Daily  Continuous Infusions: . sodium chloride Stopped (10/08/19 1028)  . cefTRIAXone (ROCEPHIN)  IV 2 g (10/09/19 0537)  . heparin 1,650 Units/hr (10/09/19 0446)  . vancomycin 750 mg (10/09/19 0615)     LOS: 6 days    Time spent: Spent more than 30 minutes in coordinating care for this patient including bedside patient care.  Lucky Cowboy, MD [facility] If 7PM-7AM, please contact night-coverage 10/09/2019, 1:01 PM   This document was prepared using Dragon voice recognition software and may contain some unintended transcription errors.

## 2019-10-09 NOTE — Progress Notes (Signed)
Inpatient Diabetes Program Recommendations  AACE/ADA: New Consensus Statement on Inpatient Glycemic Control (2015)  Target Ranges:  Prepandial:   less than 140 mg/dL      Peak postprandial:   less than 180 mg/dL (1-2 hours)      Critically ill patients:  140 - 180 mg/dL   Lab Results  Component Value Date   GLUCAP 206 (H) 10/09/2019   HGBA1C 15.2 (H) 10/04/2019    Review of Glycemic Control Results for [name], [name] (MRN [id number]) as of 10/09/2019 15:11  Ref. Range 10/09/2019 00:08 10/09/2019 04:42 10/09/2019 07:33 10/09/2019 12:24 10/09/2019 12:25  Glucose-Capillary Latest Ref Range: 70 - 99 mg/dL 175 (H) 169 (H) 140 (H) 204 (H) 206 (H)   Diabetes history: DM 2 Outpatient Diabetes medications: Metformin 1000 mg bid (note taking) Current orders for Inpatient glycemic control:  Levemir 11 units bid, Novolog 2-6 units q 4 hours  Inpatient Diabetes Program Recommendations:    A1C indicates need for insulin at d/c.  Discussed with patient.  She is currently uninsured, so will likely need 70/30 regimen.  Educated patient on insulin pen use at home.  Reviewed contents of insulin flexpen starter kit.  Reviewed all steps if insulin pen including attachment of needle, 2-unit air shot, dialing up dose, giving injection, removing needle, disposal of sharps, storage of unused insulin, disposal of insulin etc.  Patient able to provide successful return demonstration.  Also reviewed troubleshooting with insulin pen.  MD to give patient Rxs for insulin pens and insulin pen needles.She states she is interested in Insulin pen at d/c.  She can get 70/30  Pens from [facility] for 45$ for 5 pens.  Will also get her a meter for checking her blood sugars.  Discussed why this is so important and she verbalized understanding.  Also discussed signs/symptoms of both high and low blood sugars.  Taught patient treatment of hypoglycemia as well.  She will need reinforcement of this- Discussed with RN to begin teaching insulin  admin. With vial and syringe as well. Will follow-up on 2/23-  -At D/c consider 70/30 15 units bid (Novolin-reli-on from [place]).   Thanks  Adah Perl, RN, BC-ADM Inpatient Diabetes Coordinator Pager [phone] (8a-5p)

## 2019-10-09 NOTE — Progress Notes (Addendum)
  Progress Note    10/09/2019 8:08 AM 3 Days Post-Op  Subjective:  No complaints this morning   Vitals:   10/09/19 0538 10/09/19 0752  BP: (!) 130/59   Pulse: 92   Resp: 18 20  Temp: 99.1 F (37.3 C)   SpO2: 91%    Physical Exam: Lungs:  Non labored Incisions:  R groin incision c/d/i' R popliteal incision partially closed; vac with good seal; lateral fasciotomy c/d/i Extremities:  Feet symmetrically warm with motor intact Neurologic: A&O  CBC    Component Value Date/Time   WBC 13.6 (H) 10/09/2019 0314   RBC 3.96 10/09/2019 0314   HGB 12.1 10/09/2019 0314   HCT 37.1 10/09/2019 0314   PLT 115 (L) 10/09/2019 0314   MCV 93.7 10/09/2019 0314   MCV 89.0 04/02/2015 1507   MCH 30.6 10/09/2019 0314   MCHC 32.6 10/09/2019 0314   RDW 12.8 10/09/2019 0314   LYMPHSABS 2.4 10/09/2019 0314   MONOABS 1.0 10/09/2019 0314   EOSABS 0.2 10/09/2019 0314   BASOSABS 0.1 10/09/2019 0314    BMET    Component Value Date/Time   NA 142 10/09/2019 0314   K 3.6 10/09/2019 0314   CL 104 10/09/2019 0314   CO2 26 10/09/2019 0314   GLUCOSE 165 (H) 10/09/2019 0314   BUN 8 10/09/2019 0314   CREATININE 0.68 10/09/2019 0314   CREATININE 0.81 04/02/2015 1452   CALCIUM 8.6 (L) 10/09/2019 0314   GFRNONAA >60 10/09/2019 0314   GFRAA >60 10/09/2019 0314    INR    Component Value Date/Time   INR 1.5 (H) 10/04/2019 0428     Intake/Output Summary (Last 24 hours) at 10/09/2019 7619 Last data filed at 10/09/2019 0615 Gross per 24 hour  Intake 952.94 ml  Output 6450 ml  Net -5497.06 ml     Assessment/Plan:  54 y.o. female is s/p RLE thrombectomy with 4 compartment fasciotomy; subsequent lateral incision closure and partial medial incision closure 3 Days Post-Op   R foot warm and well perfused Dry dressing changes R groin daily; no tape Vac change today medial fasciotomy; WOC nurse consulted Continue IV heparin; Dr. Randie Heinz considering oral anticoagulation   Emilie Rutter,  PA-C [facility] [phone] 10/09/2019 8:08 AM  I have independently interviewed and examined patient and agree with PA assessment and plan above. Can transition to po anticoagulation. Will need home wound vac.  Stephnie Parlier C. Randie Heinz, MD [facility] [place] Office: [phone] Pager: [phone]

## 2019-10-09 NOTE — Progress Notes (Signed)
RN educated and walked patient through process of administering insulin. Pt able to successfully and safely administer insulin by herself.

## 2019-10-09 NOTE — TOC Initial Note (Addendum)
Transition of Care [facility]) - Initial/Assessment Note    Patient Details  Name: [name] MRN: [id number] Date of Birth: 1965-11-04  Transition of Care [facility]) CM/SW Contact:    Marilu Favre, RN Phone Number: 10/09/2019, 12:39 PM  Clinical Narrative:                 Confirmed face sheet information.    PCP is [facility]  Patient from home with son (54 year old), when son is at work patient's sister assists.   Ordered walker with [employer].   Discussed home wound VAC and HHRN/PT with patient. Left VAC paperwork in chart , PA aware and will sign.   Left message for Cassie at [facility] home [facility] and Linus Orn with [employer].   Changed pharmacy to transitions of care pharmacy , will continue to follow for medication needs also.   Joseph to Blanchard Mane with [facility] regarding home health RN. [facility] unable to accept due to staffing.  Spoke to Maybee at [facility], she will need an estimate of how much patient's son makes for charity application. Discussed with patient and her sister at bedside. Patient will ask her son this evening and NCM will follow up in the morning.   Explained if patient does not qualify for charity wound VAC , she could discuss payment plan with [employer]. If she discharges with wet to dry dressing changes her and her sister will have to learn wound care because [facility] will not be able to make daily visits. Aslo, [facility] will be calling to ask financial questions to see if patient qualifies for charity home health.  Expected Discharge Plan: [place]     Patient Goals and CMS Choice Patient states their goals for this hospitalization and ongoing recovery are:: to go home CMS Medicare.gov Compare Post Acute Care list provided to:: Patient Choice offered to / list presented to : Patient  Expected Discharge Plan and Services Expected Discharge Plan: [place]   Discharge Planning Services: CM Consult Post  Acute Care Choice: [place] arrangements for the past 2 months: Single Family Home                 DME Arranged: Walker rolling DME Agency: [employer] Date DME Agency Contacted: 10/09/19 Time DME Agency Contacted: 1238 Representative spoke with at DME Agency: [facility] HH Arranged: RN, PT [place] Agency: [facility] [place] Date [place]: 10/09/19 Time [place]: 1239 Representative spoke with at [place]: Left message for Cassie awaiting call back  Prior Living Arrangements/Services Living arrangements for the past 2 months: [place] Lives with:: Adult Children Patient language and need for interpreter reviewed:: Yes        Need for Family Participation in Patient Care: Yes (Comment) Care giver support system in place?: Yes (comment)   Criminal Activity/Legal Involvement Pertinent to Current Situation/Hospitalization: No - Comment as needed  Activities of Daily Living Home Assistive Devices/Equipment: None ADL Screening (condition at time of admission) Patient's cognitive ability adequate to safely complete daily activities?: Yes Is the patient deaf or have difficulty hearing?: No Does the patient have difficulty seeing, even when wearing glasses/contacts?: No Does the patient have difficulty concentrating, remembering, or making decisions?: No Patient able to express need for assistance with ADLs?: Yes Does the patient have difficulty dressing or bathing?: No Independently performs ADLs?: No Communication: Needs assistance Is this a change from baseline?: Change from baseline, expected  to last >3 days Dressing (OT): Needs assistance Is this a change from baseline?: Change from baseline, expected to last >3 days Grooming: Needs assistance Is this a change from baseline?: Change from baseline, expected to last >3 days Feeding: Needs assistance Is this a change from baseline?: Change from baseline, expected to last >3 days Bathing: Needs  assistance Is this a change from baseline?: Change from baseline, expected to last >3 days Toileting: Needs assistance Is this a change from baseline?: Change from baseline, expected to last >3days In/Out Bed: Needs assistance Is this a change from baseline?: Change from baseline, expected to last >3 days Walks in Home: Needs assistance Is this a change from baseline?: Change from baseline, expected to last >3 days Does the patient have difficulty walking or climbing stairs?: Yes Weakness of Legs: Both Weakness of Arms/Hands: Both  Permission Sought/Granted Permission sought to share information with : Case Manager Permission granted to share information with : No              Emotional Assessment Appearance:: Appears stated age Attitude/Demeanor/Rapport: Engaged Affect (typically observed): Accepting Orientation: : Oriented to Self, Oriented to Place, Oriented to  Time, Oriented to Situation Alcohol / Substance Use: Not Applicable Psych Involvement: No (comment)  Admission diagnosis:  Cellulitis of buttock [L03.317] Hypernatremia [E87.0] High anion gap metabolic acidosis [E87.2] AKI (acute kidney injury) (HCC) [N17.9] Uncontrolled type 2 diabetes mellitus with hyperosmolar nonketotic hyperglycemia (HCC) [E11.00] Hyperosmolar hyperglycemic state (HHS) (HCC) [E11.00, E11.65] Patient Active Problem List   Diagnosis Date Noted  . Abscess of deep perineal space 10/03/2019  . Sepsis (HCC) 10/03/2019  . Uncontrolled type 2 diabetes mellitus with hyperosmolar nonketotic hyperglycemia (HCC) 10/03/2019  . Delirium 10/03/2019  . Hypernatremia 10/03/2019  . AKI (acute kidney injury) (HCC)   . High anion gap metabolic acidosis   . Type 2 diabetes mellitus without complication (HCC) 04/03/2015  . Elevated cholesterol with elevated triglycerides 04/03/2015  . Chronic bronchitis (HCC) 12/31/2014  . Smoking 01/22/2013  . Mood disorder (HCC) 06/12/2012  . Bronchospasm 06/12/2012    PCP:  Patient, No Pcp Per Pharmacy:   [facility] [address] ([address]), [place] - [address] [place] ([place]) [place] [address] Phone: [phone] Fax: [phone]  [id number] [place], [place] - [address] [place] [address] Phone: [phone] Fax: [phone]  [id number] - [place], [place] - [address] [place] [address] Phone: [phone] Fax: [phone]     Social Determinants of Health (SDOH) Interventions    Readmission Risk Interventions No flowsheet data found.

## 2019-10-09 NOTE — Evaluation (Signed)
Physical Therapy Evaluation Patient Details Name: [name] MRN: [id number] DOB: 07-Sep-1965 Today's Date: 10/09/2019   History of Present Illness  54 yo admitted after found down 2/15 with sepsis, delirium, perineal abscess (with recent admit 2/10 at [facility] who declined inpatient care). 2/17 ischemic RLE with compartment syndrome. Pt s/p thrombolectomy and fasciotomies. PMHx: DM, asthma, perineal abscess, depression  Clinical Impression  Pt pleasant and reports having not been out of bed since admission. Pt able to transition to EOB and walk a few feet to chair. SPO2 reading low throughout session from 85-88% on both hands with and without O2, RN aware and stated she was reading low 90s earlier. Pt with decreased strength, transfers and gait who will benefit from acute therapy to maximize mobility, strength and function. Pt lives with son who works but reports sister will stay with her while he is working and that both would be available to learn any wound care needed. Pt encouraged to be OOB daily and will continue to follow.     Follow Up Recommendations Home health PT    Equipment Recommendations  Rolling walker with 5" wheels    Recommendations for Other Services OT consult     Precautions / Restrictions Precautions Precautions: Fall Precaution Comments: VAC      Mobility  Bed Mobility Overal bed mobility: Needs Assistance Bed Mobility: Supine to Sit     Supine to sit: Supervision     General bed mobility comments: increased time with use of rail  Transfers Overall transfer level: Needs assistance   Transfers: Sit to/from Stand Sit to Stand: Min guard         General transfer comment: cues for hand placement and safety  Ambulation/Gait Ambulation/Gait assistance: Min guard Gait Distance (Feet): 5 Feet Assistive device: Rolling walker (2 wheeled) Gait Pattern/deviations: Step-to pattern;Decreased stride length;Decreased stance time - right   Gait velocity  interpretation: >2.62 ft/sec, indicative of community ambulatory General Gait Details: cues for posture and sequence with pt limited by SpO2 dropping to 88%on RA however once seated with 3L no change in pulse ox question accuracy. pt reports she was unable to walk further due to pain and fatigue as well.  Stairs            Wheelchair Mobility    Modified Rankin (Stroke Patients Only)       Balance Overall balance assessment: Needs assistance   Sitting balance-Leahy Scale: Good     Standing balance support: Bilateral upper extremity supported Standing balance-Leahy Scale: Fair                               Pertinent Vitals/Pain Pain Assessment: 0-10 Pain Score: 3  Pain Location: RLE Pain Descriptors / Indicators: Aching;Sore Pain Intervention(s): Limited activity within patient's tolerance;Monitored during session;Repositioned    Home Living Family/patient expects to be discharged to:: Private residence Living Arrangements: Children Available Help at Discharge: Family;Available PRN/intermittently Type of Home: House Home Access: Level entry     Home Layout: One level Home Equipment: Cane - single point;Grab bars - tub/shower      Prior Function Level of Independence: Independent         Comments: son assists with homemaking     Hand Dominance        Extremity/Trunk Assessment   Upper Extremity Assessment Upper Extremity Assessment: Generalized weakness    Lower Extremity Assessment Lower Extremity Assessment: Generalized weakness    Cervical /  Trunk Assessment Cervical / Trunk Assessment: Normal  Communication   Communication: No difficulties  Cognition Arousal/Alertness: Awake/alert Behavior During Therapy: WFL for tasks assessed/performed Overall Cognitive Status: Within Functional Limits for tasks assessed                                        General Comments      Exercises     Assessment/Plan     PT Assessment Patient needs continued PT services  PT Problem List Decreased strength;Decreased mobility;Decreased activity tolerance;Decreased balance;Decreased knowledge of use of DME;Decreased skin integrity;Cardiopulmonary status limiting activity       PT Treatment Interventions DME instruction;Gait training;Stair training;Functional mobility training;Therapeutic activities;Patient/family education;Therapeutic exercise    PT Goals (Current goals can be found in the Care Plan section)  Acute Rehab PT Goals Patient Stated Goal: return home PT Goal Formulation: With patient Time For Goal Achievement: 10/23/19 Potential to Achieve Goals: Good    Frequency Min 3X/week   Barriers to discharge        Co-evaluation               AM-PAC PT "6 Clicks" Mobility  Outcome Measure Help needed turning from your back to your side while in a flat bed without using bedrails?: A Little Help needed moving from lying on your back to sitting on the side of a flat bed without using bedrails?: A Little Help needed moving to and from a bed to a chair (including a wheelchair)?: A Little Help needed standing up from a chair using your arms (e.g., wheelchair or bedside chair)?: A Little Help needed to walk in hospital room?: A Little Help needed climbing 3-5 steps with a railing? : A Little 6 Click Score: 18    End of Session Equipment Utilized During Treatment: Gait belt Activity Tolerance: Patient tolerated treatment well Patient left: in chair;with call bell/phone within reach;with nursing/sitter in room Nurse Communication: Mobility status PT Visit Diagnosis: Other abnormalities of gait and mobility (R26.89);Muscle weakness (generalized) (M62.81)    Time: 4580-9983 PT Time Calculation (min) (ACUTE ONLY): 25 min   Charges:   PT Evaluation $PT Eval Moderate Complexity: 1 Mod          Tadeo Besecker P, PT Acute Rehabilitation Services Pager: [phone] Office: [phone]   Ramel Tobon  B Jennalynn Rivard 10/09/2019, 11:59 AM

## 2019-10-09 NOTE — Progress Notes (Signed)
ANTICOAGULATION CONSULT NOTE - Follow Up Consult  Pharmacy Consult for Heparin Indication: DVT/Ischemic leg  Allergies  Allergen Reactions  . Codeine   . Guaifenesin Hives    Patient Measurements: Height: 5\' 6"  (167.6 cm) Weight: 227 lb 8.2 oz (103.2 kg) IBW/kg (Calculated) : 59.3 Heparin Dosing Weight: 83 kg  Vital Signs: Temp: 99.1 F (37.3 C) (02/22 0538) Temp Source: Oral (02/22 0538) BP: 130/59 (02/22 0538) Pulse Rate: 92 (02/22 0538)  Labs: Recent Labs     0000 10/06/19 1555 10/06/19 1925 10/06/19 2300 10/07/19 0304 10/07/19 0305 10/07/19 1622 10/07/19 1622 10/08/19 0351 10/09/19 0314 10/09/19 1017  HGB  --   --   --   --  11.1*   < >  --   --  11.3* 12.1  --   HCT  --   --   --   --  35.7*  --   --   --  35.9* 37.1  --   PLT  --   --   --   --  88*  --   --   --  109* 115*  --   HEPARINUNFRC  --   --   --    < >  --    < > 0.43   < > 0.50 0.24* 0.39  CREATININE   < > 0.70  --   --  0.66   < > 0.74  --  0.65 0.68  --   CKTOTAL  --  2,742* 2,111*  --  1,118*  --   --   --   --   --   --    < > = values in this interval not displayed.    Estimated Creatinine Clearance: 97.6 mL/min (by C-G formula based on SCr of 0.68 mg/dL).   Assessment: 54 year old female on IV Heparin for ischemic leg, R femoral clot with RLE compartment syndrome - now s/p RLE thromboembolectomy and 4 compartment fasciotomies 2/17, partial closure 2/19, with plan to return to the OR 2/22.  Heparin level is therapeutic at 0.39 on heparin 1650 units/hr. Mild anemia - stable, Hgb improved,  platelets trend back up to 115k. No bleeding reported.   Goal of Therapy:  Heparin level 0.3 to 0.7 units/ml Monitor platelets by anticoagulation protocol: Yes   Plan:  Continue heparin at 1650 units/hr Monitor daily heparin level and CBC, s/sx bleeding Plan return to OR 2/22   3/22, RPh Please check AMION for all [facility] contact numbers Clinical Pharmacist 10/09/2019 1:28 PM

## 2019-10-09 NOTE — Consult Note (Signed)
WOC Nurse Consult Note: Patient receiving care in [facility] 6N16. Reason for Consult: RLE fasciotomy VAC change Wound type: surgical Pressure Injury POA: Yes/No/NA Measurement: 11.6 cm x 3.8 cm x 2 cm Wound bed: Beefy red Drainage (amount, consistency, odor) sanginous in cannister Periwound:intact. Staples superior to wound bed protected with Mepitel dressing Dressing procedure/placement/frequency: MWF VAC change by WOC team.  One piece of black foam removed, one piece of black foam placed. Immediate seal obtained. Patient tolerated dressing change without difficulty. Helmut Muster, RN, MSN, CWOCN, CNS-BC, pager [phone]

## 2019-10-09 NOTE — Social Work (Signed)
CSW acknowledging consult for SNF placement. Will follow for therapy recommendations needed to best determine disposition/for insurance authorization. At this time pt noted to be uninsured which poses a barrier to any placement at this time.    Octavio Graves, MSW, LCSW [facility] Clinical Social Work

## 2019-10-10 DIAGNOSIS — I749 Embolism and thrombosis of unspecified artery: Secondary | ICD-10-CM

## 2019-10-10 DIAGNOSIS — M79A21 Nontraumatic compartment syndrome of right lower extremity: Secondary | ICD-10-CM

## 2019-10-10 LAB — BASIC METABOLIC PANEL
Anion gap: 12 (ref 5–15)
BUN: 8 mg/dL (ref 6–20)
CO2: 29 mmol/L (ref 22–32)
Calcium: 8.6 mg/dL — ABNORMAL LOW (ref 8.9–10.3)
Chloride: 100 mmol/L (ref 98–111)
Creatinine, Ser: 0.7 mg/dL (ref 0.44–1.00)
GFR calc Af Amer: >60 mL/min (ref >60)
GFR calc non Af Amer: >60 mL/min (ref >60)
Glucose, Bld: 128 mg/dL — ABNORMAL HIGH (ref 70–99)
Potassium: 3.8 mmol/L (ref 3.5–5.1)
Sodium: 141 mmol/L (ref 135–145)

## 2019-10-10 LAB — CBC WITH DIFFERENTIAL/PLATELET
Abs Immature Granulocytes: 0.38 10*3/uL — ABNORMAL HIGH (ref 0.00–0.07)
Basophils Absolute: 0.1 10*3/uL (ref 0.0–0.1)
Basophils Relative: 1 %
Eosinophils Absolute: 0.1 10*3/uL (ref 0.0–0.5)
Eosinophils Relative: 1 %
HCT: 36.5 % (ref 36.0–46.0)
Hemoglobin: 11.9 g/dL — ABNORMAL LOW (ref 12.0–15.0)
Immature Granulocytes: 3 %
Lymphocytes Relative: 24 %
Lymphs Abs: 2.8 10*3/uL (ref 0.7–4.0)
MCH: 30.4 pg (ref 26.0–34.0)
MCHC: 32.6 g/dL (ref 30.0–36.0)
MCV: 93.1 fL (ref 80.0–100.0)
Monocytes Absolute: 0.8 10*3/uL (ref 0.1–1.0)
Monocytes Relative: 7 %
Neutro Abs: 7.7 10*3/uL (ref 1.7–7.7)
Neutrophils Relative %: 64 %
Platelets: 145 10*3/uL — ABNORMAL LOW (ref 150–400)
RBC: 3.92 MIL/uL (ref 3.87–5.11)
RDW: 12.9 % (ref 11.5–15.5)
WBC: 11.9 10*3/uL — ABNORMAL HIGH (ref 4.0–10.5)
nRBC: 0.8 % — ABNORMAL HIGH (ref 0.0–0.2)

## 2019-10-10 LAB — GLUCOSE, CAPILLARY
Glucose-Capillary: 126 mg/dL — ABNORMAL HIGH (ref 70–99)
Glucose-Capillary: 119 mg/dL — ABNORMAL HIGH (ref 70–99)
Glucose-Capillary: 225 mg/dL — ABNORMAL HIGH (ref 70–99)
Glucose-Capillary: 201 mg/dL — ABNORMAL HIGH (ref 70–99)
Glucose-Capillary: 176 mg/dL — ABNORMAL HIGH (ref 70–99)

## 2019-10-10 LAB — AEROBIC/ANAEROBIC CULTURE W GRAM STAIN (SURGICAL/DEEP WOUND)

## 2019-10-10 LAB — MAGNESIUM: Magnesium: 1.7 mg/dL (ref 1.7–2.4)

## 2019-10-10 LAB — PHOSPHORUS: Phosphorus: 4.2 mg/dL (ref 2.5–4.6)

## 2019-10-10 LAB — HEPARIN LEVEL (UNFRACTIONATED): Heparin Unfractionated: 0.37 IU/mL (ref 0.30–0.70)

## 2019-10-10 MED ORDER — INSULIN ASPART 100 UNIT/ML ~~LOC~~ SOLN
0.0000 [IU] | Freq: Three times a day (TID) | SUBCUTANEOUS | Status: DC
  Administered 2019-10-10 – 2019-10-11 (×2): 5 [IU] via SUBCUTANEOUS
  Administered 2019-10-11: 3 [IU] via SUBCUTANEOUS
  Administered 2019-10-11: 2 [IU] via SUBCUTANEOUS
  Administered 2019-10-12: 5 [IU] via SUBCUTANEOUS
  Administered 2019-10-12: 3 [IU] via SUBCUTANEOUS
  Administered 2019-10-12: 2 [IU] via SUBCUTANEOUS

## 2019-10-10 MED ORDER — INSULIN DETEMIR 100 UNIT/ML ~~LOC~~ SOLN
12.0000 [IU] | Freq: Two times a day (BID) | SUBCUTANEOUS | Status: DC
  Filled 2019-10-10: qty 0.12

## 2019-10-10 MED ORDER — APIXABAN 5 MG PO TABS
10.0000 mg | ORAL_TABLET | Freq: Two times a day (BID) | ORAL | Status: DC
  Administered 2019-10-10 – 2019-10-12 (×5): 10 mg via ORAL
  Filled 2019-10-10 (×5): qty 2

## 2019-10-10 MED ORDER — INSULIN ASPART PROT & ASPART (70-30 MIX) 100 UNIT/ML ~~LOC~~ SUSP
10.0000 [IU] | Freq: Two times a day (BID) | SUBCUTANEOUS | Status: DC
  Administered 2019-10-10 – 2019-10-12 (×5): 10 [IU] via SUBCUTANEOUS
  Filled 2019-10-10: qty 10

## 2019-10-10 MED ORDER — APIXABAN 5 MG PO TABS: 5.0000 mg | ORAL_TABLET | Freq: Two times a day (BID) | ORAL | Status: DC

## 2019-10-10 MED ORDER — MAGNESIUM SULFATE 2 GM/50ML IV SOLN
2.0000 g | Freq: Once | INTRAVENOUS | Status: AC
  Administered 2019-10-10: 2 g via INTRAVENOUS
  Filled 2019-10-10: qty 50

## 2019-10-10 MED ORDER — INSULIN ASPART 100 UNIT/ML ~~LOC~~ SOLN: 0.0000 [IU] | Freq: Every day | SUBCUTANEOUS | Status: DC

## 2019-10-10 MED ORDER — FUROSEMIDE 10 MG/ML IJ SOLN
40.0000 mg | Freq: Once | INTRAMUSCULAR | Status: AC
  Administered 2019-10-10: 40 mg via INTRAVENOUS
  Filled 2019-10-10: qty 4

## 2019-10-10 MED ORDER — FUROSEMIDE 10 MG/ML IJ SOLN
INTRAMUSCULAR | Status: AC
  Filled 2019-10-10: qty 2

## 2019-10-10 NOTE — Progress Notes (Signed)
Transitions of Care Pharmacist Note  [name] is a 54 y.o. female that has been diagnosed with DVT/Ischemic leg and will be prescribed Eliquis (apixaban) at discharge.   Patient Education: I provided the following education on Apixaban to the patient: How to take the medication Described what the medication is Signs of bleeding Signs/symptoms of VTE and stroke  Answered their questions  Discharge Medications Plan: The patient wants to have their discharge medications filled by the Transitions of Care pharmacy rather than their usual pharmacy.  The discharge orders pharmacy has been changed to the Transitions of Care pharmacy, the patient will receive a phone call regarding co-pay, and their medications will be delivered by the Transitions of Care pharmacy.    Thank you,   Fabio Neighbors, PharmD PGY1 Ambulatory Care Resident [facility] # [phone]  October 10, 2019

## 2019-10-10 NOTE — Progress Notes (Signed)
ANTICOAGULATION CONSULT NOTE - Follow Up Consult  Pharmacy Consult for Heparin >> Apixaban Indication: arterial clot / ischemic leg s/p OR  Allergies  Allergen Reactions  . Codeine   . Guaifenesin Hives    Patient Measurements: Height: 5\' 6"  (167.6 cm) Weight: 227 lb 8.2 oz (103.2 kg) IBW/kg (Calculated) : 59.3 Heparin Dosing Weight: 83 kg  Vital Signs: Temp: 98.5 F (36.9 C) (02/23 0433) Temp Source: Oral (02/23 0433) BP: 108/66 (02/23 0433) Pulse Rate: 76 (02/23 0726)  Labs: Recent Labs    10/08/19 0351 10/08/19 0351 10/09/19 0314 10/09/19 1017 10/10/19 0137  HGB 11.3*   < > 12.1  --  11.9*  HCT 35.9*  --  37.1  --  36.5  PLT 109*  --  115*  --  145*  HEPARINUNFRC 0.50   < > 0.24* 0.39 0.37  CREATININE 0.65  --  0.68  --  0.70   < > = values in this interval not displayed.    Estimated Creatinine Clearance: 97.6 mL/min (by C-G formula based on SCr of 0.7 mg/dL).   Assessment: 54 year old female initiated on heparin 2/17 for ischemic leg, R femoral clot with RLE compartment syndrome - now s/p RLE thromboembolectomy and 4 compartment fasciotomies 2/17, partial closure 2/19, and wound VAC placement.  OK per surgery to start oral anticoagulation.  Hgb stable 11s, PLTC trending up.  Renal function stable with SCr < 1.   Goal of Therapy:  Therapeutic Anticoagulation Monitor platelets by anticoagulation protocol: Yes   Plan:  D/C heparin and associated labs. Start Apixaban 10mg  PO BID x 7 days, then 5mg  BID Follow-up for s/sx of bleeding.  3/19, Pharm.D., BCPS Clinical Pharmacist  **Pharmacist phone directory can be found on [email or web address] listed under [facility].  10/10/2019 8:58 AM

## 2019-10-10 NOTE — TOC Progression Note (Addendum)
Transition of Care [facility]) - Progression Note    Patient Details  Name: [name] MRN: [id number] Date of Birth: 1965/11/29  [facility]) CM/SW Contact  Nadene Rubins Adria Devon, RN Phone Number: 10/10/2019, 10:39 AM  Clinical Narrative:    Followed up with patient at bedside regarding [employer] charity application. Patient confirms she makes $400 a week, however, she now tells NCM she lives alone and not with son.   Tracey at [facility] aware. Aplication faxed .  Tiffany with [facility] accepted referral for [facility] and HHPT this am , however, called back and is unable to accept due to staffing. Spoke with TOC Leadership Verdon Cummins and Kandace Blitz. Was instructed to call other agencies and offer a LOG for nursing.   [facility], [facility], [facility] , [facility], [facility], [facility] and [facility] unable to accept referral. Awaiting call back from [facility] with [facility]. Vikki Ports with [facility] declined referral.    Called [facility] [phone], fax [phone]  they are willing to do letter of guarantee. One nursing visit is $125.00 for 2 hours or less. Will discuss with TOC Leadership .    Patient started on Eliquis , provided 30 day free card. Provided assistance program. Explained both to patient, patient voiced understanding.   Scheduled follow up appointment with Dr Denyse Amass for Tuesday October 17, 2019 at 1100 am. Patient to take assistance application for Eliquis with her. Patient voiced understanding.  Expected Discharge Plan: Home w Home Health Services    Expected Discharge Plan and Services Expected Discharge Plan: Home w Home Health Services   Discharge Planning Services: CM Consult Post Acute Care Choice: Home Health Living arrangements for the past 2 months: Single Family Home                 DME Arranged: Walker rolling DME Agency: [employer] Date DME Agency Contacted: 10/09/19 Time DME Agency Contacted: 1238 Representative  spoke with at DME Agency: [facility] HH Arranged: RN, PT HH Agency: [facility] Date HH Agency Contacted: 10/09/19 Time HH Agency Contacted: 1239 Representative spoke with at [facility] Agency: Left message for Cassie awaiting call back   Social Determinants of Health (SDOH) Interventions    Readmission Risk Interventions No flowsheet data found.

## 2019-10-10 NOTE — Plan of Care (Signed)

## 2019-10-10 NOTE — Progress Notes (Signed)
Inpatient Diabetes Program Recommendations  AACE/ADA: New Consensus Statement on Inpatient Glycemic Control (2015)  Target Ranges:  Prepandial:   less than 140 mg/dL      Peak postprandial:   less than 180 mg/dL (1-2 hours)      Critically ill patients:  140 - 180 mg/dL   Lab Results  Component Value Date   GLUCAP 225 (H) 10/10/2019   HGBA1C 15.2 (H) 10/04/2019    Review of Glycemic Control Results for [name], [name] (MRN [id number]) as of 10/10/2019 14:22  Ref. Range 10/09/2019 20:04 10/09/2019 23:24 10/10/2019 04:38 10/10/2019 07:43 10/10/2019 11:53  Glucose-Capillary Latest Ref Range: 70 - 99 mg/dL 846 (H) 659 (H) 935 (H) 119 (H) 225 (H)  Diabetes history: DM 2 Outpatient Diabetes medications: Metformin 1000 mg bid (note taking) Current orders for Inpatient glycemic control:  Novolog 2-6 units q 4 hours Levemir 12 units q 12 hours  Inpatient Diabetes Program Recommendations:    Note patient is uninsured.  Gave patient a copy of Reli-on list from [facility] which includes pricing for 70/30 insulin pens, insulin pen needles, meters, etc.  Reviewed with patient again her A1C and normal blood sugar values.  Discussed Hypoglycemia and treatment.  Patient was able to teach back.  We also discussed 70/30 insulin and how it should be taken with meals.  Patient states that she is comfortable giving herself insulin injections.  She also reports strong family support.  Sent message to MD regarding 70/30 and cost.  At d/c, patient will need rx. For Novolin 70/30 insulin pen (#[id number]) and insulin pen needles (#[id number]).  Will need f/u with PCP ASAP.    Thanks,  Beryl Meager, RN, BC-ADM Inpatient Diabetes Coordinator Pager [phone] (8a-5p)

## 2019-10-10 NOTE — Progress Notes (Addendum)
Physical Therapy Treatment Patient Details Name: NIKOLETTA VARMA MRN: [id number] DOB: 30-Mar-1966 Today's Date: 10/10/2019    History of Present Illness 54 yo admitted after found down 2/15 with sepsis, delirium, perineal abscess (with recent admit 2/10 at [facility] who declined inpatient care). 2/17 ischemic RLE with compartment syndrome. Pt s/p thrombolectomy and fasciotomies. PMHx: DM, asthma, perineal abscess, depression    PT Comments    Pt with improved gait tolerance today able to progress to 100' with RW. Pt with perineal abscess drainage upon standing with RN notified of need for dressing change. SpO2 90% on 2L at rest with drop to 88% on 3L with activity with pt reporting fatigue with gait. Pt educated for HEP and again stated family will be able to provide 24hr assist for D/C.     Follow Up Recommendations  Home health PT     Equipment Recommendations  Rolling walker with 5" wheels    Recommendations for Other Services       Precautions / Restrictions Precautions Precautions: Fall Precaution Comments: VAC Restrictions Weight Bearing Restrictions: No    Mobility  Bed Mobility Overal bed mobility: Needs Assistance Bed Mobility: Supine to Sit     Supine to sit: Min assist     General bed mobility comments: min assist to move RLE off of bed with increased time and HOB elevated  Transfers Overall transfer level: Needs assistance   Transfers: Sit to/from Stand Sit to Stand: Min guard         General transfer comment: cues for hand placement and safety with assist for lines  Ambulation/Gait Ambulation/Gait assistance: Min guard Gait Distance (Feet): 100 Feet Assistive device: Rolling walker (2 wheeled) Gait Pattern/deviations: Step-to pattern;Decreased stride length;Decreased stance time - right   Gait velocity interpretation: 1.31 - 2.62 ft/sec, indicative of limited community ambulator General Gait Details: cues for sequence and safety. Pt with brief standing  rest half way during gait. Pt with sats 88% on 3L with gait   Stairs             Wheelchair Mobility    Modified Rankin (Stroke Patients Only)       Balance Overall balance assessment: Needs assistance   Sitting balance-Leahy Scale: Good     Standing balance support: Bilateral upper extremity supported Standing balance-Leahy Scale: Fair                              Cognition Arousal/Alertness: Awake/alert Behavior During Therapy: WFL for tasks assessed/performed Overall Cognitive Status: Within Functional Limits for tasks assessed                                        Exercises General Exercises - Lower Extremity Long Arc Quad: AROM;Both;Seated;20 reps Hip Flexion/Marching: AAROM;Right;Left;Seated;20 reps(AAROM on RLE)    General Comments        Pertinent Vitals/Pain Pain Assessment: No/denies pain    Home Living                      Prior Function            PT Goals (current goals can now be found in the care plan section) Progress towards PT goals: Progressing toward goals    Frequency    Min 3X/week      PT Plan Current plan remains appropriate  Co-evaluation              AM-PAC PT "6 Clicks" Mobility   Outcome Measure  Help needed turning from your back to your side while in a flat bed without using bedrails?: A Little Help needed moving from lying on your back to sitting on the side of a flat bed without using bedrails?: A Little Help needed moving to and from a bed to a chair (including a wheelchair)?: A Little Help needed standing up from a chair using your arms (e.g., wheelchair or bedside chair)?: A Little Help needed to walk in hospital room?: A Little Help needed climbing 3-5 steps with a railing? : A Little 6 Click Score: 18    End of Session Equipment Utilized During Treatment: Gait belt Activity Tolerance: Patient tolerated treatment well Patient left: in chair;with call  bell/phone within reach;with nursing/sitter in room;with chair alarm set Nurse Communication: Mobility status PT Visit Diagnosis: Other abnormalities of gait and mobility (R26.89);Muscle weakness (generalized) (M62.81)     Time: 1610-9604 PT Time Calculation (min) (ACUTE ONLY): 29 min  Charges:  $Gait Training: 8-22 mins $Therapeutic Exercise: 8-22 mins                     Tinzlee Craker P, PT Acute Rehabilitation Services Pager: [phone] Office: [place] 10/10/2019, 1:16 PM

## 2019-10-10 NOTE — Progress Notes (Addendum)
  Progress Note    10/10/2019 7:48 AM 4 Days Post-Op  Subjective:  No complaints.  afebrile  Vitals:   10/10/19 0726 10/10/19 0729  BP:    Pulse: 76   Resp: 18   Temp:    SpO2: 90% 90%    Physical Exam: General:  No distress Lungs:  Non labored Incisions:  Right lateral fasciotomy incision clean with staples in tact; right medial wound with vac with good seal;  Right groin is clean and dry Extremities:  BLE warm and well perfused with motor and sensory in tact.    CBC    Component Value Date/Time   WBC 11.9 (H) 10/10/2019 0137   RBC 3.92 10/10/2019 0137   HGB 11.9 (L) 10/10/2019 0137   HCT 36.5 10/10/2019 0137   PLT 145 (L) 10/10/2019 0137   MCV 93.1 10/10/2019 0137   MCV 89.0 04/02/2015 1507   MCH 30.4 10/10/2019 0137   MCHC 32.6 10/10/2019 0137   RDW 12.9 10/10/2019 0137   LYMPHSABS 2.8 10/10/2019 0137   MONOABS 0.8 10/10/2019 0137   EOSABS 0.1 10/10/2019 0137   BASOSABS 0.1 10/10/2019 0137    BMET    Component Value Date/Time   NA 141 10/10/2019 0137   K 3.8 10/10/2019 0137   CL 100 10/10/2019 0137   CO2 29 10/10/2019 0137   GLUCOSE 128 (H) 10/10/2019 0137   BUN 8 10/10/2019 0137   CREATININE 0.70 10/10/2019 0137   CREATININE 0.81 04/02/2015 1452   CALCIUM 8.6 (L) 10/10/2019 0137   GFRNONAA >60 10/10/2019 0137   GFRAA >60 10/10/2019 0137    INR    Component Value Date/Time   INR 1.5 (H) 10/04/2019 0428     Intake/Output Summary (Last 24 hours) at 10/10/2019 0748 Last data filed at 10/10/2019 0434 Gross per 24 hour  Intake 963.01 ml  Output 2620 ml  Net -1656.99 ml     Assessment:  54 y.o. female is s/p:  1.  Exposure right common femoral, sfa and profunda femoris arteries 2.  Right lower extremity thromboembolectomy 3.  Exposure right popliteal and anterior tibial, peroneal, posterior tibial arteries 4.  4 compartment fasciotomies right lower extremity 5.  Placement of negative pressure dressing to 4 compartment fasciotomies. 6 Days  Post-Op And Closure of lateral fasciotomy site Partial closure of medial fasciotomy site and placement of VAC 4 Days Post-Op  Plan: -pt doing well with bilateral feet warm and well perfused with motor and sensory in tact.  Right lateral fasciotomy incision clean with staples in tact; right medial wound with vac with good seal;  Right groin is clean and dry-keep dry gauze in right groin to wick moisture.  -DVT prophylaxis:  Ok to start oral AC from vascular standpoint -will need wound vac at discharge as medial wound most likely won't close   Doreatha Massed, PA-C [facility] [phone] 10/10/2019 7:48 AM    I have interviewed and examined patient with PA and agree with assessment and plan above.   Cobi Aldape C. Randie Heinz, MD [facility] [place] Office: [phone] Pager: [phone]

## 2019-10-10 NOTE — Progress Notes (Signed)
PROGRESS NOTE    [name]  WUJ:[id number] DOB: 09-19-1965 DOA: 10/02/2019 PCP: Patient, No Pcp Per    Brief Narrative:  54 year old female with PMH of type 2 diabetes mellitus, asthma who was recently seen on 2/10 in [facility] ED for perineal abscess that was drained and packed but the patient declined admission.  She was brought to [facility] ED on 2/15 after being found down by her son at home with unknown downtime.  Upon EMS arrival, she was confused, tachypneic, hypoxic to 88% on room air and found to have blood glucose elevated in the 900s and hypernatremic on presentation to the ED.  CT abdomen pelvis showed gas in the right labia with extension of infection to the abdominal wall and rectus muscle for which surgery was consulted and recommended IV antibiotics alone with no surgical intervention.  She was started on IV insulin and antibiotics.  Initially admitted to the hospitalist service and subsequently transferred to the ICU for worsening mentation which ultimately required intubation on 2/17 for airway protection.  She was found to have profoundly ischemic right lower extremity on 2/17 for which she was started on heparin drip and taken to the OR for right lower extremity thrombectomy and fasciotomies by vascular surgery emergently.  Went back to the OR on 2/19 for wound VAC change.  Extubated 2/18 to HFNC and ultimately weaned to oxygen via nasal cannula 2-3 L.  Transitioned off IV insulin with mentation much improved.  TRH assumed care on 10/09/2019.   Assessment & Plan:   Active Problems:   Mood disorder (HCC)   Type 2 diabetes mellitus without complication (HCC)   Abscess of deep perineal space   Sepsis ([place])   Uncontrolled type 2 diabetes mellitus with hyperosmolar nonketotic hyperglycemia (HCC)   Delirium   Hypernatremia  Sepsis secondary to perineal abscess/abdominal wall cellulitis - S/p I&D on 2/10, no cultures taken - On clindamycin from 2/10-2/15 as outpatient - CT  abdomen pelvis on 2/15 showed gas in the right labia and extension of infection in the soft tissue of lower abdomen - Surgery on board-recommends IV antibiotics with no surgical intervention -Blood cultures 2/15 no growth in 5 days - On ceftriaxone, vancomycin to complete a total of 10 days (end date 2/24).  - Deep wound cultures on 2/19 grew Klebsiella pneumoniae resistant to ampicillin, ampicillin/sulbactam-otherwise sensitive   - Will continue vancomycin for now given her complicated hospital course. - Wound care  Acute hypoxic respiratory failure - Intubated on 2/17 for airway protection, extubated 2/18 to HFNC - Subsequent oxygen requirements thought to be in setting of derecruitment and volume overload - Continue nebulizers - Incentive spirometer - Saturating 85% on 2 L oxygen via [place], oxygenation improved to 90% with 5 L on 2/23 - Diurese with lasix 40 mg IV X1. If oxygenation improves, will start scheduled diuretics  Type 2 diabetes mellitus, poorly controlled Presented with DKA/severe hypernatremia which has since resolved - Hemoglobin A1c 15 on admission - Per diabetes coordinator patient will be able to afford insulin 70/30 at discharge. Therefore, regimen changed to 70/30 while in the hospital. Switched from levemir 11 Units BID to 70/30 10 units BID. uptitrate dose as needed.  - continue correctional insulin   Right femoral artery clot with RLE compartment syndrome, rhabdomyolysis - S/p embolectomy/fasciotomies 2/17, partial closure 2/19 - Switched from heparin drip to eliquis (provided with 30 days free card by CM and provided with assistance program. Outpatient follow up also scheduled) - Wound VAC in  place - Vascular surgery on board   Mood disorder - Continue sertraline   Urine culture from 2/16 growing > 100,000 CFU yeast from in and out cath sample Received 150 mg fluconazole per tube on 2/16 and 200 mg IV fluconazole on 2/20 Given labial wound, will keep Foley  catheter in place but switch to a new one if it has not been done so since 2/16  DVT prophylaxis: YD:XAJOINO Code Status: Full code  Family Communication:  Disposition Plan: Pending medical stability, admitted from home.  TOC on board.  Likely discharge home with home health services in 2-3 days pending vascular sx clearance, completion of IV antibiotics and improvement in respiratory status    Consultants:   General surgery, vascular surgery, transferred from PCCM  Procedures:  2/17: RLE thromboembolectomy, 4 fasciotomies  2/19: Closure of lateral fasciotomy site, partial closure of medial fasciotomy site, placement of VAC  Antimicrobials:  Anti-infectives (From admission, onward)   Start     Dose/Rate Route Frequency Ordered Stop   10/07/19 0900  fluconazole (DIFLUCAN) IVPB 200 mg     200 mg 100 mL/hr over 60 Minutes Intravenous  Once 10/07/19 0858 10/07/19 1134   10/06/19 1800  vancomycin (VANCOREADY) IVPB 750 mg/150 mL     750 mg 150 mL/hr over 60 Minutes Intravenous Every 12 hours 10/06/19 1409 10/11/19 2359   10/03/19 2200  vancomycin (VANCOCIN) IVPB 1000 mg/200 mL premix  Status:  Discontinued     1,000 mg 200 mL/hr over 60 Minutes Intravenous Every 24 hours 10/03/19 0430 10/06/19 1408   10/03/19 1800  fluconazole (DIFLUCAN) 40 MG/ML suspension 152 mg     150 mg Per Tube  Once 10/03/19 1645 10/03/19 1831   10/03/19 0600  cefTRIAXone (ROCEPHIN) 2 g in sodium chloride 0.9 % 100 mL IVPB     2 g 200 mL/hr over 30 Minutes Intravenous Every 24 hours 10/03/19 0404 10/11/19 2359   10/03/19 0415  vancomycin (VANCOCIN) IVPB 1000 mg/200 mL premix  Status:  Discontinued     1,000 mg 200 mL/hr over 60 Minutes Intravenous  Once 10/03/19 0404 10/03/19 0405   10/02/19 2315  ceFEPIme (MAXIPIME) 2 g in sodium chloride 0.9 % 100 mL IVPB     2 g 200 mL/hr over 30 Minutes Intravenous  Once 10/02/19 2306 10/03/19 0002   10/02/19 2315  metroNIDAZOLE (FLAGYL) IVPB 500 mg     500 mg 100  mL/hr over 60 Minutes Intravenous  Once 10/02/19 2306 10/03/19 0100   10/02/19 2315  vancomycin (VANCOCIN) IVPB 1000 mg/200 mL premix  Status:  Discontinued     1,000 mg 200 mL/hr over 60 Minutes Intravenous  Once 10/02/19 2306 10/02/19 2310   10/02/19 2315  vancomycin (VANCOREADY) IVPB 2000 mg/400 mL     2,000 mg 200 mL/hr over 120 Minutes Intravenous  Once 10/02/19 2310 10/03/19 0244         Subjective: Doing fine.  Sitting on the chair.  Wants to go back to bed.  No complaints.  Objective: Vitals:   10/10/19 0433 10/10/19 0726 10/10/19 0729 10/10/19 1300  BP: 108/66   121/74  Pulse: 78 76  (!) 101  Resp: _0 Temp: 98.5 F (36.9 C)   98.7 F (37.1 C)  TempSrc: Oral   Oral  SpO2: 100% 90% 90% 91%  Weight:      Height:        Intake/Output Summary (Last 24 hours) at 10/10/2019 1652 Last data filed at 10/10/2019  1300 Gross per 24 hour  Intake 672.23 ml  Output 2620 ml  Net -1947.77 ml   Filed Weights   10/02/19 2222 10/04/19 0500 10/08/19 0323  Weight: 112 kg 105.1 kg 103.2 kg    Examination:  General exam: Appears calm and comfortable  Respiratory system: decreased breath sounds bilateral. Bibasilar crackles present. No accessory muscle use Cardiovascular system: S1 & S2 heard, RRR. No JVD, murmurs.  RLE wound VAC in place gastrointestinal system: Abdomen is nondistended, tender right lower abdomen, erythema much improved based on prior images Central nervous system: Alert and oriented.  Moves all extremities voluntarily  Extremities: bilateral pedal pulses palpable Skin: right leg surgical wound with staples, clean Psychiatry: Depressed   Data Reviewed: I have personally reviewed following labs and imaging studies  CBC: Recent Labs  Lab 10/06/19 0403 10/07/19 0304 10/08/19 0351 10/09/19 0314 10/10/19 0137  WBC 19.1* 15.4* 13.6* 13.6* 11.9*  NEUTROABS 15.5* 13.5* 9.1* 9.6* 7.7  HGB 11.1* 11.1* 11.3* 12.1 11.9*  HCT 35.7* 35.7* 35.9* 37.1 36.5    MCV 97.8 96.5 96.8 93.7 93.1  PLT 97* 88* 109* 115* 161*   Basic Metabolic Panel: Recent Labs  Lab 10/04/19 0428 10/04/19 1230 10/07/19 0304 10/07/19 1622 10/08/19 0351 10/09/19 0314 10/10/19 0137  NA 163*   < > 147* 147* 146* 142 141  K 3.3*   < > 3.8 3.6 3.4* 3.6 3.8  CL 120*   < > 110 110 110 104 100  CO2 28   < > _0 GLUCOSE 215*   < > 168* 189* 96 165* 128*  BUN 26*   < > 5* _1 CREATININE 0.93   < > 0.66 0.74 0.65 0.68 0.70  CALCIUM 8.7*   < > 8.2* 8.5* 8.3* 8.6* 8.6*  MG 2.1  --   --   --  2.0 1.8 1.7  PHOS  --   --   --   --  3.1 3.8 4.2   < > = values in this interval not displayed.   GFR: Estimated Creatinine Clearance: 97.6 mL/min (by C-G formula based on SCr of 0.7 mg/dL). Liver Function Tests: Recent Labs  Lab 10/04/19 1230  AST 59*  ALT 25  ALKPHOS 124  BILITOT 0.8  PROT 5.6*  ALBUMIN 2.0*   No results for input(s): LIPASE, AMYLASE in the last 168 hours. No results for input(s): AMMONIA in the last 168 hours. Coagulation Profile: Recent Labs  Lab 10/04/19 0428  INR 1.5*   Cardiac Enzymes: Recent Labs  Lab 10/06/19 0150 10/06/19 0828 10/06/19 1555 10/06/19 1925 10/07/19 0304  CKTOTAL 3,658* 3,320* 2,742* 2,111* 1,118*   BNP (last 3 results) No results for input(s): PROBNP in the last 8760 hours. HbA1C: No results for input(s): HGBA1C in the last 72 hours. CBG: Recent Labs  Lab 10/09/19 2324 10/10/19 0438 10/10/19 0743 10/10/19 1153 10/10/19 1551  GLUCAP 120* 126* 119* 225* 201*   Lipid Profile: No results for input(s): CHOL, HDL, LDLCALC, TRIG, CHOLHDL, LDLDIRECT in the last 72 hours. Thyroid Function Tests: No results for input(s): TSH, T4TOTAL, FREET4, T3FREE, THYROIDAB in the last 72 hours. Anemia Panel: No results for input(s): VITAMINB12, FOLATE, FERRITIN, TIBC, IRON, RETICCTPCT in the last 72 hours. Sepsis Labs: Recent Labs  Lab 10/04/19 1230  LATICACIDVEN 2.6*    Recent Results (from the past  240 hour(s))  Blood culture (routine x 2)     Status: None   Collection Time: 10/02/19  10:41 PM   Specimen: BLOOD  Result Value Ref Range Status   Specimen Description BLOOD SITE NOT SPECIFIED  Final   Special Requests   Final    BOTTLES DRAWN AEROBIC AND ANAEROBIC Blood Culture adequate volume Performed at [facility], [address], [place], [place] [address]    Culture NO GROWTH 5 DAYS  Final   Report Status 10/07/2019 FINAL  Final  Respiratory Panel by RT PCR (Flu A&B, Covid) - Nasopharyngeal Swab     Status: None   Collection Time: 10/02/19 10:46 PM   Specimen: Nasopharyngeal Swab  Result Value Ref Range Status   SARS Coronavirus 2 by RT PCR NEGATIVE NEGATIVE Final    Comment: (NOTE) SARS-CoV-2 target nucleic acids are NOT DETECTED. The SARS-CoV-2 RNA is generally detectable in upper respiratoy specimens during the acute phase of infection. The lowest concentration of SARS-CoV-2 viral copies this assay can detect is 131 copies/mL. A negative result does not preclude SARS-Cov-2 infection and should not be used as the sole basis for treatment or other patient management decisions. A negative result may occur with  improper specimen collection/handling, submission of specimen other than nasopharyngeal swab, presence of viral mutation(s) within the areas targeted by this assay, and inadequate number of viral copies (<131 copies/mL). A negative result must be combined with clinical observations, patient history, and epidemiological information. The expected result is Negative. Fact Sheet for Patients:  PinkCheek.be Fact Sheet for Healthcare Providers:  GravelBags.it This test is not yet ap proved or cleared by the [place] FDA and  has been authorized for detection and/or diagnosis of SARS-CoV-2 by FDA under an Emergency Use Authorization (EUA). This EUA will remain  in effect (meaning this test can be used)  for the duration of the COVID-19 declaration under Section 564(b)(1) of the Act, 21 U.S.C. section 360bbb-3(b)(1), unless the authorization is terminated or revoked sooner.    Influenza A by PCR NEGATIVE NEGATIVE Final   Influenza B by PCR NEGATIVE NEGATIVE Final    Comment: (NOTE) The Xpert Xpress SARS-CoV-2/FLU/RSV assay is intended as an aid in  the diagnosis of influenza from Nasopharyngeal swab specimens and  should not be used as a sole basis for treatment. Nasal washings and  aspirates are unacceptable for Xpert Xpress SARS-CoV-2/FLU/RSV  testing. Fact Sheet for Patients: PinkCheek.be Fact Sheet for Healthcare Providers: GravelBags.it This test is not yet approved or cleared by the [place] FDA and  has been authorized for detection and/or diagnosis of SARS-CoV-2 by  FDA under an Emergency Use Authorization (EUA). This EUA will remain  in effect (meaning this test can be used) for the duration of the  Covid-19 declaration under Section 564(b)(1) of the Act, 21  U.S.C. section 360bbb-3(b)(1), unless the authorization is  terminated or revoked. Performed at [facility], [place] [address] [place],  [address]   Blood culture (routine x 2)     Status: None   Collection Time: 10/02/19 11:21 PM   Specimen: BLOOD LEFT HAND  Result Value Ref Range Status   Specimen Description BLOOD LEFT HAND  Final   Special Requests   Final    BOTTLES DRAWN AEROBIC AND ANAEROBIC Blood Culture results may not be optimal due to an inadequate volume of blood received in culture bottles Performed at [place] [address], [place],  [address]    Culture NO GROWTH 5 DAYS  Final   Report Status 10/07/2019 FINAL  Final  Urine culture  Status: Abnormal   Collection Time: 10/03/19  1:39 AM   Specimen: In/Out Cath Urine  Result Value Ref Range Status   Specimen Description IN/OUT CATH URINE  Final    Special Requests   Final    NONE Performed at [facility], [address], [place], [place] [address]    Culture >=100,000 COLONIES/mL YEAST (A)  Final   Report Status 10/04/2019 FINAL  Final  MRSA PCR Screening     Status: None   Collection Time: 10/03/19  8:42 PM   Specimen: Nasopharyngeal  Result Value Ref Range Status   MRSA by PCR NEGATIVE NEGATIVE Final    Comment:        The GeneXpert MRSA Assay (FDA approved for NASAL specimens only), is one component of a comprehensive MRSA colonization surveillance program. It is not intended to diagnose MRSA infection nor to guide or monitor treatment for MRSA infections. Performed at [facility], [place] [address], [place], [place] [address]   Surgical pcr screen     Status: None   Collection Time: 10/06/19  8:42 AM   Specimen: Nasal Mucosa; Nasal Swab  Result Value Ref Range Status   MRSA, PCR NEGATIVE NEGATIVE Final   Staphylococcus aureus NEGATIVE NEGATIVE Final    Comment: (NOTE) The Xpert SA Assay (FDA approved for NASAL specimens in patients 67 years of age and older), is one component of a comprehensive surveillance program. It is not intended to diagnose infection nor to guide or monitor treatment. Performed at [facility], [place] [address], [place], [place] [address]   Aerobic/Anaerobic Culture (surgical/deep wound)     Status: None   Collection Time: 10/06/19 11:02 AM   Specimen: Labia  Result Value Ref Range Status   Specimen Description LABIA  Final   Special Requests FLUID PER RN, RUN TEST REGARDLESS  Final   Gram Stain   Final    ABUNDANT WBC PRESENT,BOTH PMN AND MONONUCLEAR FEW GRAM POSITIVE COCCI FEW GRAM VARIABLE ROD    Culture   Final    FEW VIRIDANS STREPTOCOCCUS RARE KLEBSIELLA PNEUMONIAE FEW PREVOTELLA BIVIA BETA LACTAMASE POSITIVE Performed at [facility], [place] [address], [place], [place] [address]    Report Status 10/10/2019 FINAL  Final   Organism ID, Bacteria  KLEBSIELLA PNEUMONIAE  Final      Susceptibility   Klebsiella pneumoniae - MIC*    AMPICILLIN >=32 RESISTANT Resistant     CEFAZOLIN <=4 SENSITIVE Sensitive     CEFEPIME <=0.12 SENSITIVE Sensitive     CEFTAZIDIME <=1 SENSITIVE Sensitive     CEFTRIAXONE <=0.25 SENSITIVE Sensitive     CIPROFLOXACIN <=0.25 SENSITIVE Sensitive     GENTAMICIN <=1 SENSITIVE Sensitive     IMIPENEM <=0.25 SENSITIVE Sensitive     TRIMETH/SULFA <=20 SENSITIVE Sensitive     AMPICILLIN/SULBACTAM 16 INTERMEDIATE Intermediate     PIP/TAZO 8 SENSITIVE Sensitive     * RARE KLEBSIELLA PNEUMONIAE         Radiology Studies: No results found.      Scheduled Meds:  apixaban  10 mg Oral BID   Followed by   Derrill Memo ON 10/17/2019] apixaban  5 mg Oral BID   arformoterol  15 mcg Nebulization BID   budesonide (PULMICORT) nebulizer solution  0.5 mg Nebulization BID   Chlorhexidine Gluconate Cloth  6 each Topical Daily   furosemide       insulin aspart  0-15 Units Subcutaneous TID WC   insulin aspart  0-5  Units Subcutaneous QHS   insulin aspart protamine- aspart  10 Units Subcutaneous BID WC   insulin starter kit- pen needles  1 kit Other Once   living well with diabetes book   Does not apply Once   mouth rinse  15 mL Mouth Rinse BID   pantoprazole  40 mg Oral Daily   sertraline  150 mg Oral Daily   Continuous Infusions:  sodium chloride Stopped (10/08/19 1028)   cefTRIAXone (ROCEPHIN)  IV 2 g (10/10/19 0518)   vancomycin 750 mg (10/10/19 0637)     LOS: 7 days    Time spent: Spent more than 30 minutes in coordinating care for this patient including bedside patient care.  Lucky Cowboy, MD [facility] If 7PM-7AM, please contact night-coverage 10/10/2019, 4:52 PM   This document was prepared using Dragon voice recognition software and may contain some unintended transcription errors.

## 2019-10-11 DIAGNOSIS — E119 Type 2 diabetes mellitus without complications: Secondary | ICD-10-CM

## 2019-10-11 LAB — BASIC METABOLIC PANEL
Anion gap: 12 (ref 5–15)
BUN: 10 mg/dL (ref 6–20)
CO2: 29 mmol/L (ref 22–32)
Calcium: 8.5 mg/dL — ABNORMAL LOW (ref 8.9–10.3)
Chloride: 99 mmol/L (ref 98–111)
Creatinine, Ser: 0.74 mg/dL (ref 0.44–1.00)
GFR calc Af Amer: >60 mL/min (ref >60)
GFR calc non Af Amer: >60 mL/min (ref >60)
Glucose, Bld: 147 mg/dL — ABNORMAL HIGH (ref 70–99)
Potassium: 3.4 mmol/L — ABNORMAL LOW (ref 3.5–5.1)
Sodium: 140 mmol/L (ref 135–145)

## 2019-10-11 LAB — MAGNESIUM: Magnesium: 2.1 mg/dL (ref 1.7–2.4)

## 2019-10-11 LAB — GLUCOSE, CAPILLARY
Glucose-Capillary: 147 mg/dL — ABNORMAL HIGH (ref 70–99)
Glucose-Capillary: 231 mg/dL — ABNORMAL HIGH (ref 70–99)
Glucose-Capillary: 157 mg/dL — ABNORMAL HIGH (ref 70–99)
Glucose-Capillary: 153 mg/dL — ABNORMAL HIGH (ref 70–99)

## 2019-10-11 LAB — PHOSPHORUS: Phosphorus: 4.3 mg/dL (ref 2.5–4.6)

## 2019-10-11 MED ORDER — POTASSIUM CHLORIDE CRYS ER 20 MEQ PO TBCR
40.0000 meq | EXTENDED_RELEASE_TABLET | Freq: Two times a day (BID) | ORAL | Status: AC
  Administered 2019-10-11 (×2): 40 meq via ORAL
  Filled 2019-10-11 (×2): qty 2

## 2019-10-11 MED ORDER — FUROSEMIDE 10 MG/ML IJ SOLN
40.0000 mg | Freq: Once | INTRAMUSCULAR | Status: AC
  Administered 2019-10-11: 40 mg via INTRAVENOUS
  Filled 2019-10-11: qty 4

## 2019-10-11 NOTE — Progress Notes (Addendum)
  Progress Note    10/11/2019 9:24 AM 5 Days Post-Op  Subjective:  No complaints this morning   Vitals:   10/10/19 2133 10/11/19 0432  BP:  131/69  Pulse:  74  Resp:  18  Temp:  98.5 F (36.9 C)  SpO2: 93% 94%   Physical Exam: General:well nourished, well appearing, not in any discomfort Lungs:  Non labored, nasal cannula in place Incisions:  Right groin incision is clean, dry and intact. No swelling or hematoma. Right lateral fasciotomy incision clean and dry with staples intact, right medial wound proximal incision clean and dry with staples intact, distal portion of medial wound with wound VAC in place. Good seal. 200 cc serous output. Extremities:  2+ femoral pulses, DP palpable bilaterally. BLE well perfused. BLE warm with motor and sensory intact. Non tender. No swelling or edema Abdomen:  Soft, non tender Neurologic: alert and oriented  CBC    Component Value Date/Time   WBC 11.9 (H) 10/10/2019 0137   RBC 3.92 10/10/2019 0137   HGB 11.9 (L) 10/10/2019 0137   HCT 36.5 10/10/2019 0137   PLT 145 (L) 10/10/2019 0137   MCV 93.1 10/10/2019 0137   MCV 89.0 04/02/2015 1507   MCH 30.4 10/10/2019 0137   MCHC 32.6 10/10/2019 0137   RDW 12.9 10/10/2019 0137   LYMPHSABS 2.8 10/10/2019 0137   MONOABS 0.8 10/10/2019 0137   EOSABS 0.1 10/10/2019 0137   BASOSABS 0.1 10/10/2019 0137    BMET    Component Value Date/Time   NA 140 10/11/2019 0203   K 3.4 (L) 10/11/2019 0203   CL 99 10/11/2019 0203   CO2 29 10/11/2019 0203   GLUCOSE 147 (H) 10/11/2019 0203   BUN 10 10/11/2019 0203   CREATININE 0.74 10/11/2019 0203   CREATININE 0.81 04/02/2015 1452   CALCIUM 8.5 (L) 10/11/2019 0203   GFRNONAA >60 10/11/2019 0203   GFRAA >60 10/11/2019 0203    INR    Component Value Date/Time   INR 1.5 (H) 10/04/2019 0428     Intake/Output Summary (Last 24 hours) at 10/11/2019 0924 Last data filed at 10/11/2019 0538 Gross per 24 hour  Intake 850 ml  Output 4200 ml  Net -3350 ml       Assessment/Plan:  54 y.o. female is s/p  1.Exposure right common femoral, sfaand profunda femoris arteries 2.Right lower extremity thromboembolectomy 3.Exposure right popliteal and anterior tibial, peroneal, posterior tibial arteries 4.4 compartment fasciotomies right lower extremity 5. Placement of negative pressure dressing to 4 compartment fasciotomies. 6 Days Post-Op And Closure of lateral fasciotomy site Partial closure of medial fasciotomy site and placement of VAC 5 Days Post-Op  -Doing well with adequate perfusion of bilateral lower extremities, motor and sensory intact - continue PT - Wound VAC changed today by wound care with good seal. Right lateral fasciotomy incision clean and intact. Right groin incision clean and dry. - Restarted on her Apixiban  DVT prophylaxis:  Apixiban   Graceann Congress, PA-C [facility] [phone] 10/11/2019 9:24 AM   I have independently interviewed and examined patient and agree with PA assessment and plan above.   Wentworth Edelen C. Randie Heinz, MD [facility] [place] Office: [phone] Pager: [phone]

## 2019-10-11 NOTE — Plan of Care (Signed)

## 2019-10-11 NOTE — Progress Notes (Signed)
Physical Therapy Treatment Patient Details Name: [name] MRN: [id number] DOB: Jan 17, 1966 Today's Date: 10/11/2019    History of Present Illness 54 yo admitted after found down 2/15 with sepsis, delirium, perineal abscess (with recent admit 2/10 at [facility] who declined inpatient care). 2/17 ischemic RLE with compartment syndrome. Pt s/p thrombolectomy and fasciotomies. PMHx: DM, asthma, perineal abscess, depression    PT Comments    Patient continues to be agreeable to work with PT to progress mobility. She required 6L/min for O2 during gait and maintained SpO2 >90%. Pt on 5L at rest saturating at 95%. Pt required ~3 standing rest breaks during gait with cues for pursed lip breathing. HEP reviewed and pt instructed in additional repeated sit<>stands for LE strengthening. Pt with perineal abscess drainage and dressing falling out with sit<>stand transfers. RN notified of need for dressing change.  Pt will continue to benefit from skilled PT interventions to progress mobility and from follow up HHPT to continue progressing functional mobility/independence towards PLOF.    Follow Up Recommendations  Home health PT     Equipment Recommendations  Rolling walker with 5" wheels    Recommendations for Other Services       Precautions / Restrictions Precautions Precautions: Fall Precaution Comments: VAC Restrictions Weight Bearing Restrictions: No    Mobility  Bed Mobility Overal bed mobility: Needs Assistance Bed Mobility: Supine to Sit;Sit to Supine     Supine to sit: Min guard Sit to supine: HOB elevated;Min assist   General bed mobility comments: pt able to move Rt LE off bed without assist, increased time required and HOB elevated. Pt requried min assist to bring Rt LE onto bed.  Transfers Overall transfer level: Needs assistance   Transfers: Sit to/from Stand Sit to Stand: Min guard         General transfer comment: cues for hand placement and safety with assist for  lines  Ambulation/Gait Ambulation/Gait assistance: Min guard Gait Distance (Feet): 100 Feet Assistive device: Rolling walker (2 wheeled) Gait Pattern/deviations: Step-to pattern;Decreased stride length;Decreased stance time - right Gait velocity: decreased   General Gait Details: verbal cues for safe sequencing and proximity with RW, pt taking ~3 standing rest breaks this session, pt on 6L/min during mobility and remained >90% HR reached 113 bpm.   Stairs             Wheelchair Mobility    Modified Rankin (Stroke Patients Only)       Balance Overall balance assessment: Needs assistance   Sitting balance-Leahy Scale: Good     Standing balance support: Bilateral upper extremity supported Standing balance-Leahy Scale: Fair           Cognition Arousal/Alertness: Awake/alert Behavior During Therapy: WFL for tasks assessed/performed Overall Cognitive Status: Within Functional Limits for tasks assessed         Exercises General Exercises - Lower Extremity Long Arc Quad: AROM;Both;Seated;20 reps Hip Flexion/Marching: Seated;20 reps;AROM;Both(pt with less ROM on Rt LE) Other Exercises Other Exercises: 1x 5 reps Sit<>Stand from EOB: pt using bil UE for power up.    General Comments        Pertinent Vitals/Pain Pain Score: 0-No pain Pain Location: RLE Pain Intervention(s): Limited activity within patient's tolerance;Monitored during session           PT Goals (current goals can now be found in the care plan section) Acute Rehab PT Goals Patient Stated Goal: return home PT Goal Formulation: With patient Time For Goal Achievement: 10/23/19 Potential to Achieve Goals: Good  Progress towards PT goals: Progressing toward goals    Frequency    Min 3X/week      PT Plan Current plan remains appropriate       AM-PAC PT "6 Clicks" Mobility   Outcome Measure  Help needed turning from your back to your side while in a flat bed without using bedrails?:  None Help needed moving from lying on your back to sitting on the side of a flat bed without using bedrails?: A Little Help needed moving to and from a bed to a chair (including a wheelchair)?: A Little Help needed standing up from a chair using your arms (e.g., wheelchair or bedside chair)?: A Little Help needed to walk in hospital room?: A Little Help needed climbing 3-5 steps with a railing? : A Little 6 Click Score: 19    End of Session Equipment Utilized During Treatment: Gait belt Activity Tolerance: Patient tolerated treatment well Patient left: with call bell/phone within reach;in bed;with bed alarm set Nurse Communication: Mobility status PT Visit Diagnosis: Other abnormalities of gait and mobility (R26.89);Muscle weakness (generalized) (M62.81)     Time: 5188-4166 PT Time Calculation (min) (ACUTE ONLY): 34 min  Charges:  $Gait Training: 8-22 mins $Therapeutic Exercise: 8-22 mins                     Wynn Maudlin, DPT Physical Therapist with [facility] [phone]  10/11/2019 5:53 PM

## 2019-10-11 NOTE — Plan of Care (Signed)
Pt alert and oriented x4 able to express needs denies pain. On 3L o2 no acute distress noted. Appetite fair. Wound vac to right leg changed by wound nurse. Home wound VAC arrived and in pt room. Standby assist for mobility. Demonstrates proper use of call belll, bed low skid resistant socks presently on patient. Will continue to monitor

## 2019-10-11 NOTE — Consult Note (Signed)
WOC Nurse wound follow up Patient receiving care in [facility] 6N16. Wound type: Surgical wound Measurement: measured 2/22 Wound bed: beefy red, less edematous today than 2/22.  Also, present is a pearly white tendon or ligament at the superior edge of the wound Drainage (amount, consistency, odor)  serosanginous in cannister Periwound: intact, as are the staples, continued to be protected with a piece of Mepitel Dressing procedure/placement/frequency: MWF VAC dressing by WOC team.  Helmut Muster, RN, MSN, CWOCN, CNS-BC, pager [phone]

## 2019-10-11 NOTE — Progress Notes (Signed)
PROGRESS NOTE    [name]  MLY:[id number] DOB: 1965-09-18 DOA: 10/02/2019 PCP: Patient, No Pcp Per   Brief Narrative:  54 year old female with PMH of type 2 diabetes mellitus, asthma who was recently seen on 2/10 in [facility] ED for perineal abscess that was drained and packed but the patient declined admission.  She was brought to [facility] ED on 2/15 after being found down by her son at home with unknown downtime.  Upon EMS arrival, she was confused, tachypneic, hypoxic to 88% on room air and found to have blood glucose elevated in the 900s and hypernatremic on presentation to the ED.  CT abdomen pelvis showed gas in the right labia with extension of infection to the abdominal wall and rectus muscle for which surgery was consulted and recommended IV antibiotics alone with no surgical intervention.  She was started on IV insulin and antibiotics.  Initially admitted to the hospitalist service and subsequently transferred to the ICU for worsening mentation which ultimately required intubation on 2/17 for airway protection.  She was found to have profoundly ischemic right lower extremity on 2/17 for which she was started on heparin drip and taken to the OR for right lower extremity thrombectomy and fasciotomies by vascular surgery emergently.  Went back to the OR on 2/19 for wound VAC change.  Extubated 2/18 to HFNC and ultimately weaned to oxygen via nasal cannula 2-3 L.  Transitioned off IV insulin with mentation much improved.  TRH assumed care on 10/09/2019  Interim History Continues to remain on oxygen and vascular surgery is following and restarted her on apixaban today.  We will have the patient ambulate home O2 screen.  Her wound VAC was changed today  Assessment & Plan:   Active Problems:   Mood disorder (HCC)   Type 2 diabetes mellitus without complication (HCC)   Abscess of deep perineal space   Sepsis ([place])   Uncontrolled type 2 diabetes mellitus with hyperosmolar nonketotic  hyperglycemia (HCC)   Delirium   Hypernatremia  Sepsis secondary to perineal abscess/abdominal wall cellulitis - S/p I&D on 2/10, no cultures taken - On clindamycin from 2/10-2/15 as outpatient - CT abdomen pelvis on 2/15 showed gas in the right labia and extension of infection in the soft tissue of lower abdomen - Surgery on board-recommends IV antibiotics with no surgical intervention -Blood cultures 2/15 no growth in 5 days - On ceftriaxone, vancomycin to complete a total of 10 days (end date 2/24).  - Deep wound cultures on 2/19 grew Klebsiella pneumoniae resistant to ampicillin, ampicillin/sulbactam-otherwise sensitive   - Wound care -Antibiotics are to stop today today as per last of vancomycin  Acute hypoxic respiratory failure - Intubated on 2/17 for airway protection, extubated 2/18 to HFNC - Subsequent oxygen requirements thought to be in setting of derecruitment and volume overload - Continue nebulizers - Incentive spirometer - Saturating 85% on 2 L oxygen via [place], oxygenation improved to 90% with 5 L on 2/23 and will need to start weaning - Diurese with lasix 40 mg IV X1. If oxygenation improves, will start scheduled diuretics -we will give another diuresis dose of 40 Lasix -She will need an ambulatory home O2 screen prior to discharge -Repeat chest x-ray in the a.m.  Type 2 diabetes mellitus, poorly controlled Presented with DKA/severe hypernatremia which has since resolved - Hemoglobin A1c 15 on admission - Per diabetes coordinator patient will be able to afford insulin 70/30 at discharge. Therefore, regimen changed to 70/30 while in the hospital. Switched  from levemir 11 Units BID to 70/30 10 units BID. uptitrate dose as needed.  -Continue sliding scale insulin moderate scale before meals and at bedtime -CBGs ranging from 147-231  Right femoral artery clot with RLE compartment syndrome, rhabdomyolysis - S/p embolectomy/fasciotomies 2/17, partial closure 2/19 -  Switched from heparin drip to eliquis (provided with 30 days free card by CM and provided with assistance program. Outpatient follow up also scheduled) -Wound VAC in place -Vascular surgery on board  -Patient was started back on Apixaban  -Pain Control   Mood disorder -Continue Sertraline 150 mg po Daily  Hypokalemia -Patient's K+ was 3.4 -Replete with po KCl 40 mEQ BID -Continue to Monitor and Replete as Necessary -Repeat as Necessary  Urine culture from 2/16 growing > 100,000 CFU yeast from in and out cath sample -Received 150 mg fluconazole per tube on 2/16 and 200 mg IV fluconazole on 2/20 -Given labial wound, will keep Foley catheter in place but switch to a new one if it has not been done so since 2/16  Obesity -Estimated body mass index is 36.72 kg/m as calculated from the following:   Height as of this encounter: 5' 6" (1.676 m).   Weight as of this encounter: 103.2 kg. -Weight Loss and Dietary Counseling given   DVT prophylaxis: Apixaban Code Status: FULL CODE Family Communication: No family present at bedside Disposition Plan: D/C Home with Home Health PT when Medically stable and O2 Improving   Consultants:   Vascular Surgery    Procedures:  Status post compartment fasciotomy right leg   Antimicrobials:  Anti-infectives (From admission, onward)   Start     Dose/Rate Route Frequency Ordered Stop   10/07/19 0900  fluconazole (DIFLUCAN) IVPB 200 mg     200 mg 100 mL/hr over 60 Minutes Intravenous  Once 10/07/19 0858 10/07/19 1134   10/06/19 1800  vancomycin (VANCOREADY) IVPB 750 mg/150 mL     750 mg 150 mL/hr over 60 Minutes Intravenous Every 12 hours 10/06/19 1409 10/11/19 2359   10/03/19 2200  vancomycin (VANCOCIN) IVPB 1000 mg/200 mL premix  Status:  Discontinued     1,000 mg 200 mL/hr over 60 Minutes Intravenous Every 24 hours 10/03/19 0430 10/06/19 1408   10/03/19 1800  fluconazole (DIFLUCAN) 40 MG/ML suspension 152 mg     150 mg Per Tube  Once  10/03/19 1645 10/03/19 1831   10/03/19 0600  cefTRIAXone (ROCEPHIN) 2 g in sodium chloride 0.9 % 100 mL IVPB     2 g 200 mL/hr over 30 Minutes Intravenous Every 24 hours 10/03/19 0404 10/11/19 0528   10/03/19 0415  vancomycin (VANCOCIN) IVPB 1000 mg/200 mL premix  Status:  Discontinued     1,000 mg 200 mL/hr over 60 Minutes Intravenous  Once 10/03/19 0404 10/03/19 0405   10/02/19 2315  ceFEPIme (MAXIPIME) 2 g in sodium chloride 0.9 % 100 mL IVPB     2 g 200 mL/hr over 30 Minutes Intravenous  Once 10/02/19 2306 10/03/19 0002   10/02/19 2315  metroNIDAZOLE (FLAGYL) IVPB 500 mg     500 mg 100 mL/hr over 60 Minutes Intravenous  Once 10/02/19 2306 10/03/19 0100   10/02/19 2315  vancomycin (VANCOCIN) IVPB 1000 mg/200 mL premix  Status:  Discontinued     1,000 mg 200 mL/hr over 60 Minutes Intravenous  Once 10/02/19 2306 10/02/19 2310   10/02/19 2315  vancomycin (VANCOREADY) IVPB 2000 mg/400 mL     2,000 mg 200 mL/hr over 120 Minutes Intravenous  Once 10/02/19   2310 10/03/19 0244     Subjective: And examined at bedside and she was feeling good today.  States her leg is not hurting as much.  No nausea or vomiting.  Still requiring oxygen still on 5 L.  No chest pain. no other concerns or points at this time.  Objective: Vitals:   10/10/19 2037 10/10/19 2133 10/11/19 0432 10/11/19 1417  BP: (!) 106/54  131/69 (!) 93/49  Pulse: 81  74 70  Resp: 18  18 16  Temp: 98.6 F (37 C)  98.5 F (36.9 C) 98.6 F (37 C)  TempSrc: Oral  Oral Oral  SpO2: 98% 93% 94% 96%  Weight:      Height:        Intake/Output Summary (Last 24 hours) at 10/11/2019 1851 Last data filed at 10/11/2019 0538 Gross per 24 hour  Intake 490 ml  Output 700 ml  Net -210 ml   Filed Weights   10/02/19 2222 10/04/19 0500 10/08/19 0323  Weight: 112 kg 105.1 kg 103.2 kg   Examination: Physical Exam:  Constitutional: WN/WD obese Caucasian female in NAD and appears calm  Eyes: Lids and conjunctivae normal, sclerae  anicteric  ENMT: External Ears, Nose appear normal. Grossly normal hearing.  Neck: Appears normal, supple, no cervical masses, normal ROM, no appreciable thyromegaly; no JVD Respiratory: Diminished to auscultation bilaterally with coarse breath sounds, no wheezing, rales, rhonchi or crackles. Normal respiratory effort and patient is not tachypenic. No accessory muscle use. Unlabored breathing  Cardiovascular: RRR, no murmurs / rubs / gallops. S1 and S2 auscultated.  Abdomen: Soft, non-tender, Distended 2/2 body habitus. Bowel sounds positive x4.  GU: Deferred. Musculoskeletal: No clubbing / cyanosis of digits/nails. Right Leg with Wound Vac and Incision that appears C/D/I Skin: No rashes, lesions, ulcers. No induration; Warm and dry.  Neurologic: CN 2-12 grossly intact with no focal deficits. Romberg sign cerebellar reflexes not assessed.  Psychiatric: Normal judgment and insight. Alert and oriented x 3. Normal mood and appropriate affect.   Data Reviewed: I have personally reviewed following labs and imaging studies  CBC: Recent Labs  Lab 10/06/19 0403 10/07/19 0304 10/08/19 0351 10/09/19 0314 10/10/19 0137  WBC 19.1* 15.4* 13.6* 13.6* 11.9*  NEUTROABS 15.5* 13.5* 9.1* 9.6* 7.7  HGB 11.1* 11.1* 11.3* 12.1 11.9*  HCT 35.7* 35.7* 35.9* 37.1 36.5  MCV 97.8 96.5 96.8 93.7 93.1  PLT 97* 88* 109* 115* 145*   Basic Metabolic Panel: Recent Labs  Lab 10/07/19 1622 10/08/19 0351 10/09/19 0314 10/10/19 0137 10/11/19 0203  NA 147* 146* 142 141 140  K 3.6 3.4* 3.6 3.8 3.4*  CL 110 110 104 100 99  CO2 28 28 26 29 29  GLUCOSE 189* 96 165* 128* 147*  BUN 7 11 8 8 10  CREATININE 0.74 0.65 0.68 0.70 0.74  CALCIUM 8.5* 8.3* 8.6* 8.6* 8.5*  MG  --  2.0 1.8 1.7 2.1  PHOS  --  3.1 3.8 4.2 4.3   GFR: Estimated Creatinine Clearance: 97.6 mL/min (by C-G formula based on SCr of 0.74 mg/dL). Liver Function Tests: No results for input(s): AST, ALT, ALKPHOS, BILITOT, PROT, ALBUMIN in the last  168 hours. No results for input(s): LIPASE, AMYLASE in the last 168 hours. No results for input(s): AMMONIA in the last 168 hours. Coagulation Profile: No results for input(s): INR, PROTIME in the last 168 hours. Cardiac Enzymes: Recent Labs  Lab 10/06/19 0150 10/06/19 0828 10/06/19 1555 10/06/19 1925 10/07/19 0304  CKTOTAL 3,658* 3,320* 2,742* 2,111* 1,118*     BNP (last 3 results) No results for input(s): PROBNP in the last 8760 hours. HbA1C: No results for input(s): HGBA1C in the last 72 hours. CBG: Recent Labs  Lab 10/10/19 1551 10/10/19 2033 10/11/19 0736 10/11/19 1214 10/11/19 1630  GLUCAP 201* 176* 147* 231* 157*   Lipid Profile: No results for input(s): CHOL, HDL, LDLCALC, TRIG, CHOLHDL, LDLDIRECT in the last 72 hours. Thyroid Function Tests: No results for input(s): TSH, T4TOTAL, FREET4, T3FREE, THYROIDAB in the last 72 hours. Anemia Panel: No results for input(s): VITAMINB12, FOLATE, FERRITIN, TIBC, IRON, RETICCTPCT in the last 72 hours. Sepsis Labs: No results for input(s): PROCALCITON, LATICACIDVEN in the last 168 hours.  Recent Results (from the past 240 hour(s))  Blood culture (routine x 2)     Status: None   Collection Time: 10/02/19 10:41 PM   Specimen: BLOOD  Result Value Ref Range Status   Specimen Description BLOOD SITE NOT SPECIFIED  Final   Special Requests   Final    BOTTLES DRAWN AEROBIC AND ANAEROBIC Blood Culture adequate volume Performed at [facility], [address], [place], [place] [address]    Culture NO GROWTH 5 DAYS  Final   Report Status 10/07/2019 FINAL  Final  Respiratory Panel by RT PCR (Flu A&B, Covid) - Nasopharyngeal Swab     Status: None   Collection Time: 10/02/19 10:46 PM   Specimen: Nasopharyngeal Swab  Result Value Ref Range Status   SARS Coronavirus 2 by RT PCR NEGATIVE NEGATIVE Final    Comment: (NOTE) SARS-CoV-2 target nucleic acids are NOT DETECTED. The SARS-CoV-2 RNA is generally detectable in upper  respiratoy specimens during the acute phase of infection. The lowest concentration of SARS-CoV-2 viral copies this assay can detect is 131 copies/mL. A negative result does not preclude SARS-Cov-2 infection and should not be used as the sole basis for treatment or other patient management decisions. A negative result may occur with  improper specimen collection/handling, submission of specimen other than nasopharyngeal swab, presence of viral mutation(s) within the areas targeted by this assay, and inadequate number of viral copies (<131 copies/mL). A negative result must be combined with clinical observations, patient history, and epidemiological information. The expected result is Negative. Fact Sheet for Patients:  PinkCheek.be Fact Sheet for Healthcare Providers:  GravelBags.it This test is not yet ap proved or cleared by the [place] FDA and  has been authorized for detection and/or diagnosis of SARS-CoV-2 by FDA under an Emergency Use Authorization (EUA). This EUA will remain  in effect (meaning this test can be used) for the duration of the COVID-19 declaration under Section 564(b)(1) of the Act, 21 U.S.C. section 360bbb-3(b)(1), unless the authorization is terminated or revoked sooner.    Influenza A by PCR NEGATIVE NEGATIVE Final   Influenza B by PCR NEGATIVE NEGATIVE Final    Comment: (NOTE) The Xpert Xpress SARS-CoV-2/FLU/RSV assay is intended as an aid in  the diagnosis of influenza from Nasopharyngeal swab specimens and  should not be used as a sole basis for treatment. Nasal washings and  aspirates are unacceptable for Xpert Xpress SARS-CoV-2/FLU/RSV  testing. Fact Sheet for Patients: PinkCheek.be Fact Sheet for Healthcare Providers: GravelBags.it This test is not yet approved or cleared by the [place] FDA and  has been authorized for  detection and/or diagnosis of SARS-CoV-2 by  FDA under an Emergency Use Authorization (EUA). This EUA will remain  in effect (meaning this test can be used) for the duration of the  Covid-19 declaration under Section  564(b)(1) of the Act, 21  U.S.C. section 360bbb-3(b)(1), unless the authorization is  terminated or revoked. Performed at [facility], [place] [address], [place], [place] [address]   Blood culture (routine x 2)     Status: None   Collection Time: 10/02/19 11:21 PM   Specimen: BLOOD LEFT HAND  Result Value Ref Range Status   Specimen Description BLOOD LEFT HAND  Final   Special Requests   Final    BOTTLES DRAWN AEROBIC AND ANAEROBIC Blood Culture results may not be optimal due to an inadequate volume of blood received in culture bottles Performed at [facility] [address], [place], [place] [address]    Culture NO GROWTH 5 DAYS  Final   Report Status 10/07/2019 FINAL  Final  Urine culture     Status: Abnormal   Collection Time: 10/03/19  1:39 AM   Specimen: In/Out Cath Urine  Result Value Ref Range Status   Specimen Description IN/OUT CATH URINE  Final   Special Requests   Final    NONE Performed at [facility], [place] [address] [place], [place] [address]    Culture >=100,000 COLONIES/mL YEAST (A)  Final   Report Status 10/04/2019 FINAL  Final  MRSA PCR Screening     Status: None   Collection Time: 10/03/19  8:42 PM   Specimen: Nasopharyngeal  Result Value Ref Range Status   MRSA by PCR NEGATIVE NEGATIVE Final    Comment:        The GeneXpert MRSA Assay (FDA approved for NASAL specimens only), is one component of a comprehensive MRSA colonization surveillance program. It is not intended to diagnose MRSA infection nor to guide or monitor treatment for MRSA infections. Performed at [facility], [place] [address], [place], [place] [address]   Surgical pcr screen     Status: None   Collection Time: 10/06/19  8:42 AM   Specimen: Nasal  Mucosa; Nasal Swab  Result Value Ref Range Status   MRSA, PCR NEGATIVE NEGATIVE Final   Staphylococcus aureus NEGATIVE NEGATIVE Final    Comment: (NOTE) The Xpert SA Assay (FDA approved for NASAL specimens in patients 49 years of age and older), is one component of a comprehensive surveillance program. It is not intended to diagnose infection nor to guide or monitor treatment. Performed at [facility], [place] [address], [place], [place] [address]   Aerobic/Anaerobic Culture (surgical/deep wound)     Status: None   Collection Time: 10/06/19 11:02 AM   Specimen: Labia  Result Value Ref Range Status   Specimen Description LABIA  Final   Special Requests FLUID PER RN, RUN TEST REGARDLESS  Final   Gram Stain   Final    ABUNDANT WBC PRESENT,BOTH PMN AND MONONUCLEAR FEW GRAM POSITIVE COCCI FEW GRAM VARIABLE ROD    Culture   Final    FEW VIRIDANS STREPTOCOCCUS RARE KLEBSIELLA PNEUMONIAE FEW PREVOTELLA BIVIA BETA LACTAMASE POSITIVE Performed at [facility], [place] [address], [place], [place] [address]    Report Status 10/10/2019 FINAL  Final   Organism ID, Bacteria KLEBSIELLA PNEUMONIAE  Final      Susceptibility   Klebsiella pneumoniae - MIC*    AMPICILLIN >=32 RESISTANT Resistant     CEFAZOLIN <=4 SENSITIVE Sensitive     CEFEPIME <=0.12 SENSITIVE Sensitive     CEFTAZIDIME <=1 SENSITIVE Sensitive     CEFTRIAXONE <=0.25 SENSITIVE Sensitive     CIPROFLOXACIN <=0.25 SENSITIVE Sensitive  GENTAMICIN <=1 SENSITIVE Sensitive     IMIPENEM <=0.25 SENSITIVE Sensitive     TRIMETH/SULFA <=20 SENSITIVE Sensitive     AMPICILLIN/SULBACTAM 16 INTERMEDIATE Intermediate     PIP/TAZO 8 SENSITIVE Sensitive     * RARE KLEBSIELLA PNEUMONIAE     Radiology Studies: No results found.  Scheduled Meds: . apixaban  10 mg Oral BID   Followed by  . [START ON 10/17/2019] apixaban  5 mg Oral BID  . arformoterol  15 mcg Nebulization BID  . budesonide (PULMICORT) nebulizer solution  0.5 mg  Nebulization BID  . Chlorhexidine Gluconate Cloth  6 each Topical Daily  . insulin aspart  0-15 Units Subcutaneous TID WC  . insulin aspart  0-5 Units Subcutaneous QHS  . insulin aspart protamine- aspart  10 Units Subcutaneous BID WC  . insulin starter kit- pen needles  1 kit Other Once  . living well with diabetes book   Does not apply Once  . mouth rinse  15 mL Mouth Rinse BID  . pantoprazole  40 mg Oral Daily  . potassium chloride  40 mEq Oral BID  . sertraline  150 mg Oral Daily   Continuous Infusions: . sodium chloride Stopped (10/08/19 1028)  . vancomycin 750 mg (10/11/19 1738)    LOS: 8 days   Kerney Elbe, DO [facility] PAGER is on [place]  If 7PM-7AM, please contact night-coverage [email or web address]

## 2019-10-12 LAB — COMPREHENSIVE METABOLIC PANEL
ALT: 47 U/L — ABNORMAL HIGH (ref 0–44)
AST: 24 U/L (ref 15–41)
Albumin: 2.2 g/dL — ABNORMAL LOW (ref 3.5–5.0)
Alkaline Phosphatase: 190 U/L — ABNORMAL HIGH (ref 38–126)
Anion gap: 14 (ref 5–15)
BUN: 10 mg/dL (ref 6–20)
CO2: 28 mmol/L (ref 22–32)
Calcium: 8.8 mg/dL — ABNORMAL LOW (ref 8.9–10.3)
Chloride: 98 mmol/L (ref 98–111)
Creatinine, Ser: 0.75 mg/dL (ref 0.44–1.00)
GFR calc Af Amer: >60 mL/min (ref >60)
GFR calc non Af Amer: >60 mL/min (ref >60)
Glucose, Bld: 196 mg/dL — ABNORMAL HIGH (ref 70–99)
Potassium: 3.5 mmol/L (ref 3.5–5.1)
Sodium: 140 mmol/L (ref 135–145)
Total Bilirubin: 0.4 mg/dL (ref 0.3–1.2)
Total Protein: 6.7 g/dL (ref 6.5–8.1)

## 2019-10-12 LAB — GLUCOSE, CAPILLARY
Glucose-Capillary: 150 mg/dL — ABNORMAL HIGH (ref 70–99)
Glucose-Capillary: 199 mg/dL — ABNORMAL HIGH (ref 70–99)
Glucose-Capillary: 240 mg/dL — ABNORMAL HIGH (ref 70–99)

## 2019-10-12 LAB — CBC WITH DIFFERENTIAL/PLATELET
Abs Immature Granulocytes: 0.09 10*3/uL — ABNORMAL HIGH (ref 0.00–0.07)
Basophils Absolute: 0.1 10*3/uL (ref 0.0–0.1)
Basophils Relative: 1 %
Eosinophils Absolute: 0.1 10*3/uL (ref 0.0–0.5)
Eosinophils Relative: 1 %
HCT: 40.9 % (ref 36.0–46.0)
Hemoglobin: 13 g/dL (ref 12.0–15.0)
Immature Granulocytes: 1 %
Lymphocytes Relative: 21 %
Lymphs Abs: 2 10*3/uL (ref 0.7–4.0)
MCH: 29.5 pg (ref 26.0–34.0)
MCHC: 31.8 g/dL (ref 30.0–36.0)
MCV: 92.7 fL (ref 80.0–100.0)
Monocytes Absolute: 0.7 10*3/uL (ref 0.1–1.0)
Monocytes Relative: 8 %
Neutro Abs: 6.3 10*3/uL (ref 1.7–7.7)
Neutrophils Relative %: 68 %
Platelets: 148 10*3/uL — ABNORMAL LOW (ref 150–400)
RBC: 4.41 MIL/uL (ref 3.87–5.11)
RDW: 13.1 % (ref 11.5–15.5)
WBC: 9.2 10*3/uL (ref 4.0–10.5)
nRBC: 0.5 % — ABNORMAL HIGH (ref 0.0–0.2)

## 2019-10-12 LAB — PHOSPHORUS: Phosphorus: 4.5 mg/dL (ref 2.5–4.6)

## 2019-10-12 LAB — MAGNESIUM: Magnesium: 1.8 mg/dL (ref 1.7–2.4)

## 2019-10-12 MED ORDER — ALBUTEROL SULFATE HFA 108 (90 BASE) MCG/ACT IN AERS: INHALATION_SPRAY | RESPIRATORY_TRACT | 0 refills | Status: AC

## 2019-10-12 MED ORDER — QVAR 40 MCG/ACT IN AERS: 1.0000 | INHALATION_SPRAY | Freq: Two times a day (BID) | RESPIRATORY_TRACT | 6 refills | Status: AC

## 2019-10-12 MED ORDER — INSULIN PEN NEEDLE 32G X 4 MM MISC: 10.0000 [IU] | Freq: Two times a day (BID) | 0 refills | Status: AC

## 2019-10-12 MED ORDER — INSULIN STARTER KIT- PEN NEEDLES (ENGLISH): 1.0000 | Freq: Once | 0 refills | Status: AC

## 2019-10-12 MED ORDER — OXYCODONE HCL 5 MG PO TABS: 5.0000 mg | ORAL_TABLET | ORAL | 0 refills | Status: DC | PRN

## 2019-10-12 MED ORDER — NOVOLIN 70/30 FLEXPEN RELION (70-30) 100 UNIT/ML ~~LOC~~ SUPN: 10.0000 [IU] | PEN_INJECTOR | Freq: Two times a day (BID) | SUBCUTANEOUS | 11 refills | Status: AC

## 2019-10-12 MED ORDER — METFORMIN HCL 500 MG PO TABS: 1000.0000 mg | ORAL_TABLET | Freq: Two times a day (BID) | ORAL | 3 refills | Status: AC

## 2019-10-12 MED ORDER — SERTRALINE HCL 50 MG PO TABS: 150.0000 mg | ORAL_TABLET | Freq: Every day | ORAL | 0 refills | Status: DC

## 2019-10-12 MED ORDER — APIXABAN 5 MG PO TABS: ORAL_TABLET | ORAL | 0 refills | Status: DC

## 2019-10-12 MED ORDER — LIVING WELL WITH DIABETES BOOK: 1.0000 | Freq: Once | 0 refills | Status: AC

## 2019-10-12 MED ORDER — PANTOPRAZOLE SODIUM 40 MG PO TBEC: 40.0000 mg | DELAYED_RELEASE_TABLET | Freq: Every day | ORAL | 0 refills | Status: AC

## 2019-10-12 MED ORDER — PNEUMOCOCCAL VAC POLYVALENT 25 MCG/0.5ML IJ INJ
0.5000 mL | INJECTION | INTRAMUSCULAR | Status: DC
  Filled 2019-10-12: qty 0.5

## 2019-10-12 MED ORDER — INFLUENZA VAC SPLIT QUAD 0.5 ML IM SUSY
0.5000 mL | PREFILLED_SYRINGE | INTRAMUSCULAR | Status: DC
  Filled 2019-10-12: qty 0.5

## 2019-10-12 MED ORDER — LISINOPRIL 2.5 MG PO TABS: 2.5000 mg | ORAL_TABLET | Freq: Every day | ORAL | 3 refills | Status: AC

## 2019-10-12 MED ORDER — MONTELUKAST SODIUM 10 MG PO TABS: 10.0000 mg | ORAL_TABLET | Freq: Every day | ORAL | 3 refills | Status: AC

## 2019-10-12 MED FILL — NOVOLIN 70/30 100 UNITS/ML: (70-30) 100 | 28 days supply | Qty: 10 | Fill #0

## 2019-10-12 MED FILL — LISINOPRIL 2.5 MG TABLET: 2.5 | 30 days supply | Qty: 30 | Fill #0

## 2019-10-12 MED FILL — QVAR REDIHALER 40 MCG/ACT A: 40 | 1 days supply | Qty: 11 | Fill #0

## 2019-10-12 MED FILL — ELIQUIS STARTER PACK 5 MG T: 5 | 30 days supply | Qty: 74 | Fill #0

## 2019-10-12 MED FILL — PANTOPRAZOLE SOD DR 40 MG T: 40 | 30 days supply | Qty: 30 | Fill #0

## 2019-10-12 MED FILL — metFORMIN HCL 500 MG TABS: 500 | 30 days supply | Qty: 120 | Fill #0

## 2019-10-12 MED FILL — ULTICARE INS 0.3 ML 30GX1/2: 30G X 1/2" | 30 days supply | Qty: 60 | Fill #0

## 2019-10-12 MED FILL — MONTELUKAST SOD 10 MG TAB: 10 | 30 days supply | Qty: 30 | Fill #0

## 2019-10-12 MED FILL — oxyCODONE HCL 5 MG TABS: 5 | 2 days supply | Qty: 10 | Fill #0

## 2019-10-12 MED FILL — SERTRALINE HCL 50 MG TABLET: 50 | 30 days supply | Qty: 90 | Fill #0

## 2019-10-12 MED FILL — ALBUTEROL SULFATE HFA 108 (: 108 (90 BAS | 25 days supply | Qty: 18 | Fill #0

## 2019-10-12 NOTE — Discharge Summary (Signed)
Physician Discharge Summary  [name] WCH:[id number] DOB: Nov 13, 1965 DOA: 10/02/2019  PCP: Patient, No Pcp Per  Admit date: 10/02/2019 Discharge date: 10/12/2019  Admitted From: Home  Disposition: Home with Home Health RN and PT  Recommendations for Outpatient Follow-up:  1. Follow up with PCP in 1-2 weeks 2. Follow up with Vascular Surgery within 1-2 weeks 3. Follow-up with general surgery in outpatient setting 4. Please obtain CMP/CBC, Mag, Phos in one week 5. Please follow up on the following pending results:  Home Health: Yes Equipment/Devices: Conservation officer, nature with 5" Wheels   Discharge Condition: Stable CODE STATUS: Full code Diet recommendation: Heart healthy carb modified diet  Brief/Interim Summary: 54 year old female with PMH of type 2 diabetes mellitus, asthma who was recently seen on 2/10 in [facility] ED for perineal abscess that was drained and packed but the patient declined admission. She was brought to [facility] ED on 2/15 after being found down by her son at home with unknown downtime. Upon EMS arrival, she was confused, tachypneic, hypoxic to 88% on room air and found to have blood glucose elevated in the 900s and hypernatremic on presentation to the ED. CT abdomen pelvis showed gas in the right labia with extension of infection to the abdominal wall and rectus muscle for which surgery was consulted and recommended IV antibiotics alone with no surgical intervention. She was started on IV insulin and antibiotics. Initially admitted to the hospitalist service and subsequently transferred to the ICU for worsening mentation which ultimately required intubation on 2/17 for airway protection. She was found to have profoundly ischemic right lower extremity on 2/17 for which she was started on heparin drip and taken to the OR for right lower extremity thrombectomy and fasciotomies by vascular surgery emergently. Went back to the OR on 2/19 for wound VAC change. Extubated 2/18 to  HFNC and ultimately weaned to oxygen via nasal cannula 2-3 L. Transitioned off IV insulin with mentation much improved. TRH assumed care on 10/09/2019  Interim History Continued to remain on oxygen and vascular surgery is following and started her on apixaban yesterday.  Given regular dose of Lasix yesterday with improvement and she was weaned off oxygen and ambulated without issues and did not desaturate.  Wound VAC was changed yesterday and home health was not set up for her wound VAC.  She is deemed stable from a vascular standpoint as well as medical standpoint she is improved significantly and will be discharged home with home health RN and PT.   Discharge Diagnoses:  Active Problems:   Mood disorder (HCC)   Type 2 diabetes mellitus without complication (HCC)   Abscess of deep perineal space   Sepsis ([place])   Uncontrolled type 2 diabetes mellitus with hyperosmolar nonketotic hyperglycemia (HCC)   Delirium   Hypernatremia  Sepsis secondary to perineal abscess/abdominal wall cellulitis - S/p I&D on 2/10, no cultures taken - On clindamycin from 2/10-2/15 as outpatient - CT abdomen pelvis on 2/15 showed gas in the right labia and extension of infection in the soft tissue of lower abdomen -Surgery on board-recommends IV antibiotics with no surgical intervention -Blood cultures 2/15 no growth in 5 days -She was on on ceftriaxone, vancomycin but completed a total of 10 days (end date 2/24).  - Deep wound cultures on 2/19 grew Klebsiella pneumoniae resistant to ampicillin, ampicillin/sulbactam-otherwise sensitive  -Wound care continue with her wound VAC -WBC went from 22.2 is now 9.2 -Sepsis physiology is improved and she is deemed stable for discharge and will  need to follow-up with PCP and general surgery in the outpatient setting  Acute Hypoxic Respiratory Failure - Intubated on 2/17 for airway protection, extubated 2/18 to HFNC - Subsequent oxygen requirements thought to be in  setting of derecruitment and volume overload - Continue nebulizers - Incentive spirometer -Saturating 85% on 2 L oxygen via [place], oxygenation improved to 90% with 5 L on 2/23 and will need to start weaning - Diurese with lasix 40 mg again yesterday.  Oxygen saturation is now improved -She will need an ambulatory home O2 screen prior to discharge and she did not desaturate -Repeat chest x-ray in 3 to 6 weeks  Type 2 diabetes mellitus, poorly controlled Presented with DKA/severe hypernatremia which has since resolved - Hemoglobin A1c 15 on admission -Per diabetes coordinator patient will be able to afford insulin 70/30 at discharge. Therefore, regimen changed to 70/30 while in the hospital. Switched from levemir 11 Units BID to 70/30 10 units BID. uptitrate dose as needed but will be discharged on 10 units twice daily given her hemoglobin A1c of 15.2 -Continue sliding scale insulin moderate scale before meals and at bedtime -CBGs ranging from 86-240 -Follow-up in the outpatient setting with PCP and continue with lisinopril low-dose  Right femoral artery clot with RLE compartment syndrome, rhabdomyolysis - S/p embolectomy/fasciotomies 2/17, partial closure 2/19 -Switched from heparin drip to eliquis (provided with 30 days free card by CM and provided with assistance program. Outpatient follow up also scheduled) -Wound VAC in place -Vascular surgery on board -Patient was started back on Apixaban will continue at discharge -Pain Control  with oxycodone  Mood Disorder -Continue Sertraline 150 mg po Daily discharge  Hypokalemia -Patient's K+ was 3.5 and improved from yesterday -Continue to Monitor and Replete as Necessary -Repeat as Necessary  Urine culture from 2/16 growing > 100,000 CFU yeast from in and out cath sample -Received 150 mg fluconazole per tube on 2/16 and 200 mg IV fluconazole on 2/20 -Given that she had labial wound, will keep Foley catheter in place while  hospitalized but will discontinue now that she is treated with antibiotics for full course and will remove prior to discharge  Obesity -Estimated body mass index is 36.72 kg/m as calculated from the following:   Height as of this encounter: '5\' 6"'  (1.676 m).   Weight as of this encounter: 103.2 kg. -Weight Loss and Dietary Counseling given   Thrombocytopenia -Stable -Platelet count is 148,000 -Continue to Monitor for S/Sx of Bleeding -Repeat CBC as an outpatient   Elevated ALT -Was mild with ALT being 47 -Continue to Monitor in the outpatient setting within 1-2 weeks   Discharge Instructions  Allergies as of 10/12/2019      Reactions   Codeine    Guaifenesin Hives      Medication List    STOP taking these medications   clindamycin 150 MG capsule Commonly known as: CLEOCIN   HYDROcodone-homatropine 5-1.5 MG/5ML syrup Commonly known as: HYCODAN     TAKE these medications   albuterol 108 (90 Base) MCG/ACT inhaler Commonly known as: Ventolin HFA INHALE TWO PUFFS BY MOUTH 4 TIMES DAILY AS NEEDED.  Pt prefers ventolin brand   apixaban 5 MG Tabs tablet Commonly known as: ELIQUIS Take 10 mg (Two 5 mg tab) twice daily for 1 week and then 5 mg po BID from then on starting 10/17/19   atorvastatin 10 MG tablet Commonly known as: LIPITOR Take 1 tablet (10 mg total) by mouth daily.   blood glucose meter kit  and supplies Kit Dispense based on patient and insurance preference. Use up to four times daily as directed. (FOR ICD-9 250.00, 250.01).   Insulin Pen Needle 32G X 4 MM Misc 10 Units by Does not apply route in the morning and at bedtime.   insulin starter kit- pen needles Misc 1 kit by Other route once for 1 dose.   lisinopril 2.5 MG tablet Commonly known as: ZESTRIL Take 1 tablet (2.5 mg total) by mouth daily.   living well with diabetes book Misc 1 each by Does not apply route once for 1 dose.   loratadine 10 MG tablet Commonly known as: CLARITIN Take 10 mg by  mouth daily.   metFORMIN 500 MG tablet Commonly known as: GLUCOPHAGE Take 2 tablets (1,000 mg total) by mouth 2 (two) times daily with a meal.   montelukast 10 MG tablet Commonly known as: SINGULAIR Take 1 tablet (10 mg total) by mouth at bedtime.   NovoLIN 70/30 FlexPen Relion (70-30) 100 UNIT/ML PEN Generic drug: Insulin Isophane & Regular Human Inject 10 Units into the skin in the morning and at bedtime.   oxyCODONE 5 MG immediate release tablet Commonly known as: Oxy IR/ROXICODONE Take 1-2 tablets (5-10 mg total) by mouth every 4 (four) hours as needed for moderate pain.   pantoprazole 40 MG tablet Commonly known as: PROTONIX Take 1 tablet (40 mg total) by mouth daily. Start taking on: October 13, 2019   Primatene Mist 0.125 MG/ACT Aero Generic drug: EPINEPHrine Inhale 2 puffs into the lungs every 4 (four) hours as needed (BREATHING ISSUES).   Qvar 40 MCG/ACT inhaler Generic drug: beclomethasone Inhale 1 puff into the lungs 2 (two) times daily.   sertraline 50 MG tablet Commonly known as: ZOLOFT Take 3 tablets (150 mg total) by mouth daily. Start taking on: October 13, 2019            Durable Medical Equipment  (From admission, onward)         Start     Ordered   10/09/19 1237  For home use only DME Walker rolling  Once    Question Answer Comment  Walker: With 5 Inch Wheels   Patient needs a walker to treat with the following condition Weakness      10/09/19 1236         Follow-up Information    Blandford, Jannette Fogo, NP Follow up.   Specialty: Family Medicine Why: follow up appointment October 17, 2019 at 1100 am  Contact information: [address] [place] [address] [phone]          Allergies  Allergen Reactions  . Codeine   . Guaifenesin Hives   Consultations:  Vascular Surgery  Procedures/Studies: CT ABDOMEN PELVIS WO CONTRAST  Result Date: 10/03/2019 CLINICAL DATA:  Nominal distension. EXAM: CT ABDOMEN AND  PELVIS WITHOUT CONTRAST TECHNIQUE: Multidetector CT imaging of the abdomen and pelvis was performed following the standard protocol without IV contrast. COMPARISON:  Contrast-enhanced CT 5 days ago FINDINGS: Lower chest: No consolidation or pleural fluid. Breathing motion artifact limits detailed assessment. Hepatobiliary: Mild hepatic steatosis without focal lesion. Clips in the gallbladder fossa postcholecystectomy. No biliary dilatation. Pancreas: No ductal dilatation or inflammation. Spleen: Normal in size without focal abnormality. Adrenals/Urinary Tract: Normal adrenal glands. No hydronephrosis or perinephric edema. No renal or ureteral calculi. Urinary bladder is physiologically distended. No bladder wall thickening. Stomach/Bowel: Bowel evaluation limited in the absence of contrast and patient motion. Nondistended stomach. Few prominent fluid-filled small bowel  without obstruction or inflammation. Normal appendix. Moderate colonic stool burden without inflammation. Vascular/Lymphatic: Abdominal aorta is normal in caliber. Minor aortic atherosclerosis. Prominent bilateral external iliac and inguinal nodes. Reproductive: Uterus and bilateral adnexa are unremarkable. Other: Previous labial and right perineal soft tissue thickening has improved, however there is patchy soft tissue air in the right perineum, not entirely included in the field of view. This tracks anteriorly towards the right inguinal region. Heterogeneous stranding and induration of the anterior abdominal wall superficial to the abdominal wall musculature with ill-defined fluid measuring approximately 6.1 by 2.2 cm, series 3, image 73. Lack of contrast limits more detailed evaluation. There is no intra-abdominal free air free fluid. Small fat containing umbilical hernia. Musculoskeletal: Facet hypertrophy in the lower lumbar spine. There are no acute or suspicious osseous abnormalities. IMPRESSION: 1. Soft tissue gas in the right labia and  perineum. Labial induration on CT 5 days ago has improved. This area is not entirely included in the field of view. Cannot differentiate soft tissue changes/air from recent incision and drainage versus necrotizing soft tissue infection. 2. There is new subcutaneous induration and inflammatory changes involving the right anterior abdominal wall with ill-defined fluid measuring approximately 6.1 x 2.2 cm. This is suspicious for soft tissue infection with phlegmonous or early abscess changes. Inflammatory changes remains superficial to the anterior abdominal wall. 3. Prominent bilateral external iliac and inguinal nodes are likely reactive. 4. Mild hepatic steatosis. These results were called by telephone at the time of interpretation on 10/03/2019 at 12:57 am to [place] , who verbally acknowledged these results. Aortic Atherosclerosis (ICD10-I70.0). Electronically Signed   By: Keith Rake M.D.   On: 10/03/2019 00:58   DG Chest 1 View  Result Date: 10/05/2019 CLINICAL DATA:  ARDS. EXAM: CHEST  1 VIEW COMPARISON:  Chest radiograph 10/04/2019 FINDINGS: A previously demonstrated ET tube is no longer visualized. An enteric tube passes below the level of left hemidiaphragm with tip excluded from the field of view. Unchanged position of a left IJ approach central venous catheter with tip projecting in the region of the caudal SVC. Shallow inspiration radiograph. Similar appearance of hazy bibasilar opacities. No evidence of pneumothorax. Cardiomediastinal silhouette unchanged. IMPRESSION: Interval extubation.  Additional support devices as described. Similar appearance of hazy bibasilar opacities as compared to 10/04/2019. Findings may reflect layering pleural effusions, atelectasis or pneumonia. Electronically Signed   By: Kellie Simmering DO   On: 10/05/2019 10:51   CT ABDOMEN PELVIS W CONTRAST  Result Date: 09/28/2019 CLINICAL DATA:  Abscess on the labia, irritation EXAM: CT ABDOMEN AND PELVIS WITH  CONTRAST TECHNIQUE: Multidetector CT imaging of the abdomen and pelvis was performed using the standard protocol following bolus administration of intravenous contrast. CONTRAST:  17m OMNIPAQUE IOHEXOL 300 MG/ML  SOLN COMPARISON:  None. FINDINGS: Lower chest: The visualized heart size within normal limits. No pericardial fluid/thickening. No hiatal hernia. The visualized portions of the lungs are clear. Hepatobiliary: The liver is normal in density without focal abnormality.The main portal vein is patent. The patient is status post cholecystectomy. No biliary ductal dilation. Pancreas: Unremarkable. No pancreatic ductal dilatation or surrounding inflammatory changes. Spleen: Normal in size without focal abnormality. Adrenals/Urinary Tract: Both adrenal glands appear normal. The kidneys and collecting system appear normal without evidence of urinary tract calculus or hydronephrosis. Bladder is unremarkable. Stomach/Bowel: The stomach, small bowel, and colon are normal in appearance. No inflammatory changes, wall thickening, or obstructive findings.The appendix is normal. Vascular/Lymphatic: There are no enlarged mesenteric, retroperitoneal, or  pelvic lymph nodes. No significant vascular findings are present. Reproductive: The uterus and adnexa are unremarkable. Other: Along the right labial and perineal soft tissues there is a extensive amount of soft tissue thickening and fat stranding changes which extend into the right medial buttocks and anterior labral soft tissues. A loculated fluid collection seen within the left posterior labial fold measuring 5.3 x 1.6 by 2.1 cm. There are small foci of air either seen adjacent to or within the collection. No subcutaneous emphysema seen within the surrounding soft tissues however. There is non loculated fluid which extends anteriorly into the left labial fold. For Musculoskeletal: No acute or significant osseous findings. IMPRESSION: Posterior left labial/perineal soft  tissue abscess measuring 5.3 x 1.6 x 2.1 cm with significant surrounding inflammatory changes. There is non loculated fluid/phlegmon which extends anteriorly within the labial fold. No subcutaneous emphysema however is noted. Electronically Signed   By: Prudencio Pair M.D.   On: 09/28/2019 01:43   DG Chest Port 1 View  Result Date: 10/07/2019 CLINICAL DATA:  Acute respiratory failure EXAM: PORTABLE CHEST 1 VIEW COMPARISON:  10/05/2019 chest radiograph. FINDINGS: Enteric tube enters stomach with the tip not seen on this image. Left internal jugular central venous catheter terminates in upper right mediastinum. Stable cardiomediastinal silhouette with top-normal heart size. No pneumothorax. Small bilateral pleural effusions are unchanged. Stable mild bibasilar atelectasis. No overt pulmonary edema. Low lung volumes. IMPRESSION: Stable low lung volumes with small bilateral pleural effusions and mild bibasilar atelectasis. Electronically Signed   By: Ilona Sorrel M.D.   On: 10/07/2019 05:35   DG CHEST PORT 1 VIEW  Result Date: 10/04/2019 CLINICAL DATA:  Bedside central venous catheter placement and intubation. EXAM: PORTABLE CHEST 1 VIEW COMPARISON:  10/03/2019 and earlier. FINDINGS: Endotracheal tube tip in satisfactory position projecting approximately 3 cm above the carina. LEFT jugular central venous catheter tip projects over the mid SVC. Feeding tube courses below the diaphragm into the stomach though its tip is not included on the image. No evidence of pneumothorax or mediastinal hematoma. Hazy opacities at the lung bases, RIGHT greater than LEFT, new since yesterday. Lungs remain clear otherwise. Pulmonary vascularity normal. IMPRESSION: 1. Endotracheal tube tip in satisfactory position projecting approximately 3 cm above the carina. 2. LEFT jugular central venous catheter tip projects over the mid SVC. No acute complicating features. 3. Developing bibasilar atelectasis and/or pneumonia, RIGHT greater than  LEFT. Electronically Signed   By: Evangeline Dakin M.D.   On: 10/04/2019 15:55   DG Chest Portable 1 View  Result Date: 10/03/2019 CLINICAL DATA:  Hypoxia. Additional history provided by technologist: History of asthma diabetes. EXAM: PORTABLE CHEST 1 VIEW COMPARISON:  Same day CT abdomen/pelvis 10/03/2019, chest radiograph 10/02/2019 FINDINGS: Heart size within normal limits. No evidence of airspace consolidation within the lungs. No evidence of pleural effusion or pneumothorax. No acute bony abnormality. Thoracic spondylosis. Overlying cardiac monitoring leads. IMPRESSION: No radiographic evidence of acute cardiopulmonary abnormality. Electronically Signed   By: Kellie Simmering DO   On: 10/03/2019 08:00   DG Chest Port 1 View  Result Date: 10/02/2019 CLINICAL DATA:  Altered mental status EXAM: PORTABLE CHEST 1 VIEW COMPARISON:  01/16/2013 FINDINGS: Rotated patient. Slight asymmetric increased interstitial opacity left thorax. No consolidation. Normal heart size. Mild convex opacity at the right hilus likely due to vascular structure and patient rotation. No pneumothorax. IMPRESSION: Slight asymmetric interstitial opacity in the left thorax, question atypical infection, doubt asymmetric edema. Electronically Signed   By: Madie Reno.D.  On: 10/02/2019 22:47   ECHOCARDIOGRAM COMPLETE  Result Date: 10/05/2019    ECHOCARDIOGRAM REPORT   Patient Name:   DANELLY HASSINGER Yun Date of Exam: 10/05/2019 Medical Rec #:  [id number]      Height:       66.0 in Accession #:    [id number]     Weight:       231.7 lb Date of Birth:  [birth year]      BSA:          2.13 m Patient Age:    54 years       BP:           110/58 mmHg Patient Gender: F              HR:           81 bpm. Exam Location:  Inpatient Procedure: 2D Echo Indications:    stroke 434.91  History:        Patient has no prior history of Echocardiogram examinations.                 Risk Factors:Diabetes and Current Smoker.  Sonographer:    Jannett Celestine RDCS (AE)  Referring Phys: [id number] Candee Furbish  Sonographer Comments: Suboptimal apical window. IMPRESSIONS  1. Left ventricular ejection fraction, by estimation, is 60 to 65%. The left ventricle has normal function. The left ventricle has no regional wall motion abnormalities. There is mild left ventricular hypertrophy. Left ventricular diastolic parameters are consistent with Grade I diastolic dysfunction (impaired relaxation).  2. Right ventricular systolic function is normal. The right ventricular size is normal. Tricuspid regurgitation signal is inadequate for assessing PA pressure.  3. The mitral valve is normal in structure and function. No evidence of mitral valve regurgitation. No evidence of mitral stenosis.  4. The aortic valve is tricuspid. Aortic valve regurgitation is not visualized. No aortic stenosis is present.  5. The inferior vena cava is normal in size with greater than 50% respiratory variability, suggesting right atrial pressure of 3 mmHg.  6. Technically difficult study with poor acoustic windows. FINDINGS  Left Ventricle: Left ventricular ejection fraction, by estimation, is 60 to 65%. The left ventricle has normal function. The left ventricle has no regional wall motion abnormalities. The left ventricular internal cavity size was normal in size. There is  mild left ventricular hypertrophy. Left ventricular diastolic parameters are consistent with Grade I diastolic dysfunction (impaired relaxation). Right Ventricle: The right ventricular size is normal. No increase in right ventricular wall thickness. Right ventricular systolic function is normal. Tricuspid regurgitation signal is inadequate for assessing PA pressure. Left Atrium: Left atrial size was normal in size. Right Atrium: Right atrial size was normal in size. Pericardium: There is no evidence of pericardial effusion. Mitral Valve: The mitral valve is normal in structure and function. No evidence of mitral valve regurgitation. No evidence of  mitral valve stenosis. Tricuspid Valve: The tricuspid valve is normal in structure. Tricuspid valve regurgitation is not demonstrated. Aortic Valve: The aortic valve is tricuspid. Aortic valve regurgitation is not visualized. No aortic stenosis is present. Pulmonic Valve: The pulmonic valve was normal in structure. Pulmonic valve regurgitation is not visualized. Aorta: The aortic root is normal in size and structure. Venous: The inferior vena cava is normal in size with greater than 50% respiratory variability, suggesting right atrial pressure of 3 mmHg. IAS/Shunts: No atrial level shunt detected by color flow Doppler.  LEFT VENTRICLE PLAX 2D LVIDd:  3.90 cm  Diastology LVIDs:         2.20 cm  LV e' lateral:   8.27 cm/s LV PW:         1.10 cm  LV E/e' lateral: 9.1 LV IVS:        1.10 cm  LV e' medial:    7.18 cm/s LVOT diam:     1.80 cm  LV E/e' medial:  10.4 LV SV:         43.77 ml LV SV Index:   22.00 LVOT Area:     2.54 cm  RIGHT VENTRICLE RV S prime:     18.70 cm/s TAPSE (M-mode): 2.6 cm LEFT ATRIUM         Index LA diam:    3.80 cm 1.78 cm/m  AORTIC VALVE LVOT Vmax:   96.00 cm/s LVOT Vmean:  75.000 cm/s LVOT VTI:    0.172 m  AORTA Ao Root diam: 2.60 cm MITRAL VALVE MV Area (PHT): 2.71 cm    SHUNTS MV Decel Time: 280 msec    Systemic VTI:  0.17 m MV E velocity: 75.00 cm/s  Systemic Diam: 1.80 cm MV A velocity: 87.80 cm/s MV E/A ratio:  0.85 Loralie Champagne MD Electronically signed by Loralie Champagne MD Signature Date/Time: 10/05/2019/5:00:13 PM    Final      Subjective: Seen and examined at bedside and she is doing well.  Weaned off oxygen and felt good.  Pain was controlled.  No nausea or vomiting.  Having bowel movements.  She has all her wound VAC supplies.  She is deemed stable for discharge only to follow-up with PCP and vascular and she understands agrees with plan of care.  Discharge Exam: Vitals:   10/12/19 1445 10/12/19 1552  BP:  113/69  Pulse:  97  Resp:  18  Temp:  98.4 F (36.9 C)   SpO2: 91% 94%   Vitals:   10/12/19 1124 10/12/19 1300 10/12/19 1445 10/12/19 1552  BP:  111/63  113/69  Pulse:  80  97  Resp:  17  18  Temp:  98.8 F (37.1 C)  98.4 F (36.9 C)  TempSrc:  Oral  Oral  SpO2: (!) 86% 90% 91% 94%  Weight:      Height:       General: Pt is alert, awake, not in acute distress Cardiovascular: RRR, S1/S2 +, no rubs, no gallops Respiratory: Diminished bilaterally, no wheezing, no rhonchi Abdominal: Soft, NT, Distended secondary body habitus, bowel sounds + Extremities: no edema, no cyanosis; right leg has a wound VAC and incisions appear clean dry intact  The results of significant diagnostics from this hospitalization (including imaging, microbiology, ancillary and laboratory) are listed below for reference.    Microbiology: Recent Results (from the past 240 hour(s))  Blood culture (routine x 2)     Status: None   Collection Time: 10/02/19 10:41 PM   Specimen: BLOOD  Result Value Ref Range Status   Specimen Description BLOOD SITE NOT SPECIFIED  Final   Special Requests   Final    BOTTLES DRAWN AEROBIC AND ANAEROBIC Blood Culture adequate volume Performed at [facility], [address], [place], [place] [address]    Culture NO GROWTH 5 DAYS  Final   Report Status 10/07/2019 FINAL  Final  Respiratory Panel by RT PCR (Flu A&B, Covid) - Nasopharyngeal Swab     Status: None   Collection Time: 10/02/19 10:46 PM   Specimen: Nasopharyngeal Swab  Result Value Ref  Range Status   SARS Coronavirus 2 by RT PCR NEGATIVE NEGATIVE Final    Comment: (NOTE) SARS-CoV-2 target nucleic acids are NOT DETECTED. The SARS-CoV-2 RNA is generally detectable in upper respiratoy specimens during the acute phase of infection. The lowest concentration of SARS-CoV-2 viral copies this assay can detect is 131 copies/mL. A negative result does not preclude SARS-Cov-2 infection and should not be used as the sole basis for treatment or other patient management decisions.  A negative result may occur with  improper specimen collection/handling, submission of specimen other than nasopharyngeal swab, presence of viral mutation(s) within the areas targeted by this assay, and inadequate number of viral copies (<131 copies/mL). A negative result must be combined with clinical observations, patient history, and epidemiological information. The expected result is Negative. Fact Sheet for Patients:  PinkCheek.be Fact Sheet for Healthcare Providers:  GravelBags.it This test is not yet ap proved or cleared by the [place] FDA and  has been authorized for detection and/or diagnosis of SARS-CoV-2 by FDA under an Emergency Use Authorization (EUA). This EUA will remain  in effect (meaning this test can be used) for the duration of the COVID-19 declaration under Section 564(b)(1) of the Act, 21 U.S.C. section 360bbb-3(b)(1), unless the authorization is terminated or revoked sooner.    Influenza A by PCR NEGATIVE NEGATIVE Final   Influenza B by PCR NEGATIVE NEGATIVE Final    Comment: (NOTE) The Xpert Xpress SARS-CoV-2/FLU/RSV assay is intended as an aid in  the diagnosis of influenza from Nasopharyngeal swab specimens and  should not be used as a sole basis for treatment. Nasal washings and  aspirates are unacceptable for Xpert Xpress SARS-CoV-2/FLU/RSV  testing. Fact Sheet for Patients: PinkCheek.be Fact Sheet for Healthcare Providers: GravelBags.it This test is not yet approved or cleared by the [place] FDA and  has been authorized for detection and/or diagnosis of SARS-CoV-2 by  FDA under an Emergency Use Authorization (EUA). This EUA will remain  in effect (meaning this test can be used) for the duration of the  Covid-19 declaration under Section 564(b)(1) of the Act, 21  U.S.C. section 360bbb-3(b)(1), unless the authorization is   terminated or revoked. Performed at [facility], [place] [address], [place], [place] [address]   Blood culture (routine x 2)     Status: None   Collection Time: 10/02/19 11:21 PM   Specimen: BLOOD LEFT HAND  Result Value Ref Range Status   Specimen Description BLOOD LEFT HAND  Final   Special Requests   Final    BOTTLES DRAWN AEROBIC AND ANAEROBIC Blood Culture results may not be optimal due to an inadequate volume of blood received in culture bottles Performed at [place] [address] [place], [place] [address]    Culture NO GROWTH 5 DAYS  Final   Report Status 10/07/2019 FINAL  Final  Urine culture     Status: Abnormal   Collection Time: 10/03/19  1:39 AM   Specimen: In/Out Cath Urine  Result Value Ref Range Status   Specimen Description IN/OUT CATH URINE  Final   Special Requests   Final    NONE Performed at [facility], [place] [address], [place], [place] [address]    Culture >=100,000 COLONIES/mL YEAST (A)  Final   Report Status 10/04/2019 FINAL  Final  MRSA PCR Screening     Status: None   Collection Time: 10/03/19  8:42 PM   Specimen: Nasopharyngeal  Result Value Ref Range Status  MRSA by PCR NEGATIVE NEGATIVE Final    Comment:        The GeneXpert MRSA Assay (FDA approved for NASAL specimens only), is one component of a comprehensive MRSA colonization surveillance program. It is not intended to diagnose MRSA infection nor to guide or monitor treatment for MRSA infections. Performed at [facility], [place] [address], [place], [place] [address]   Surgical pcr screen     Status: None   Collection Time: 10/06/19  8:42 AM   Specimen: Nasal Mucosa; Nasal Swab  Result Value Ref Range Status   MRSA, PCR NEGATIVE NEGATIVE Final   Staphylococcus aureus NEGATIVE NEGATIVE Final    Comment: (NOTE) The Xpert SA Assay (FDA approved for NASAL specimens in patients 35 years of age and older), is one component of a comprehensive surveillance  program. It is not intended to diagnose infection nor to guide or monitor treatment. Performed at [facility], [place] [address] [place], [place] [address]   Aerobic/Anaerobic Culture (surgical/deep wound)     Status: None   Collection Time: 10/06/19 11:02 AM   Specimen: Labia  Result Value Ref Range Status   Specimen Description LABIA  Final   Special Requests FLUID PER RN, RUN TEST REGARDLESS  Final   Gram Stain   Final    ABUNDANT WBC PRESENT,BOTH PMN AND MONONUCLEAR FEW GRAM POSITIVE COCCI FEW GRAM VARIABLE ROD    Culture   Final    FEW VIRIDANS STREPTOCOCCUS RARE KLEBSIELLA PNEUMONIAE FEW PREVOTELLA BIVIA BETA LACTAMASE POSITIVE Performed at [facility], [place] [address], [place],  [address]    Report Status 10/10/2019 FINAL  Final   Organism ID, Bacteria KLEBSIELLA PNEUMONIAE  Final      Susceptibility   Klebsiella pneumoniae - MIC*    AMPICILLIN >=32 RESISTANT Resistant     CEFAZOLIN <=4 SENSITIVE Sensitive     CEFEPIME <=0.12 SENSITIVE Sensitive     CEFTAZIDIME <=1 SENSITIVE Sensitive     CEFTRIAXONE <=0.25 SENSITIVE Sensitive     CIPROFLOXACIN <=0.25 SENSITIVE Sensitive     GENTAMICIN <=1 SENSITIVE Sensitive     IMIPENEM <=0.25 SENSITIVE Sensitive     TRIMETH/SULFA <=20 SENSITIVE Sensitive     AMPICILLIN/SULBACTAM 16 INTERMEDIATE Intermediate     PIP/TAZO 8 SENSITIVE Sensitive     * RARE KLEBSIELLA PNEUMONIAE     Labs: BNP (last 3 results) No results for input(s): BNP in the last 8760 hours. Basic Metabolic Panel: Recent Labs  Lab 10/08/19 0351 10/09/19 0314 10/10/19 0137 10/11/19 0203 10/12/19 0153  NA 146* 142 141 140 140  K 3.4* 3.6 3.8 3.4* 3.5  CL 110 104 100 99 98  CO2 '28 26 29 29 28  ' GLUCOSE 96 165* 128* 147* 196*  BUN '11 8 8 10 10  ' CREATININE 0.65 0.68 0.70 0.74 0.75  CALCIUM 8.3* 8.6* 8.6* 8.5* 8.8*  MG 2.0 1.8 1.7 2.1 1.8  PHOS 3.1 3.8 4.2 4.3 4.5   Liver Function Tests: Recent Labs  Lab 10/12/19 0153  AST 24  ALT  47*  ALKPHOS 190*  BILITOT 0.4  PROT 6.7  ALBUMIN 2.2*   No results for input(s): LIPASE, AMYLASE in the last 168 hours. No results for input(s): AMMONIA in the last 168 hours. CBC: Recent Labs  Lab 10/07/19 0304 10/08/19 0351 10/09/19 0314 10/10/19 0137 10/12/19 0153  WBC 15.4* 13.6* 13.6* 11.9* 9.2  NEUTROABS 13.5* 9.1* 9.6* 7.7 6.3  HGB 11.1* 11.3* 12.1 11.9* 13.0  HCT 35.7*  35.9* 37.1 36.5 40.9  MCV 96.5 96.8 93.7 93.1 92.7  PLT 88* 109* 115* 145* 148*   Cardiac Enzymes: Recent Labs  Lab 10/06/19 0150 10/06/19 0828 10/06/19 1555 10/06/19 1925 10/07/19 0304  CKTOTAL 3,658* 3,320* 2,742* 2,111* 1,118*   BNP: Invalid input(s): POCBNP CBG: Recent Labs  Lab 10/11/19 1214 10/11/19 1630 10/11/19 2217 10/12/19 0800 10/12/19 1235  GLUCAP 231* 157* 153* 150* 199*   D-Dimer No results for input(s): DDIMER in the last 72 hours. Hgb A1c No results for input(s): HGBA1C in the last 72 hours. Lipid Profile No results for input(s): CHOL, HDL, LDLCALC, TRIG, CHOLHDL, LDLDIRECT in the last 72 hours. Thyroid function studies No results for input(s): TSH, T4TOTAL, T3FREE, THYROIDAB in the last 72 hours.  Invalid input(s): FREET3 Anemia work up No results for input(s): VITAMINB12, FOLATE, FERRITIN, TIBC, IRON, RETICCTPCT in the last 72 hours. Urinalysis    Component Value Date/Time   COLORURINE YELLOW 10/03/2019 0139   APPEARANCEUR HAZY (A) 10/03/2019 0139   LABSPEC 1.024 10/03/2019 0139   PHURINE 5.0 10/03/2019 0139   GLUCOSEU >=500 (A) 10/03/2019 0139   HGBUR MODERATE (A) 10/03/2019 0139   BILIRUBINUR NEGATIVE 10/03/2019 0139   BILIRUBINUR Negative 04/02/2015 1509   KETONESUR 80 (A) 10/03/2019 0139   PROTEINUR NEGATIVE 10/03/2019 0139   UROBILINOGEN 0.2 04/02/2015 1509   NITRITE NEGATIVE 10/03/2019 0139   LEUKOCYTESUR MODERATE (A) 10/03/2019 0139   Sepsis Labs Invalid input(s): PROCALCITONIN,  WBC,  LACTICIDVEN Microbiology Recent Results (from the past  240 hour(s))  Blood culture (routine x 2)     Status: None   Collection Time: 10/02/19 10:41 PM   Specimen: BLOOD  Result Value Ref Range Status   Specimen Description BLOOD SITE NOT SPECIFIED  Final   Special Requests   Final    BOTTLES DRAWN AEROBIC AND ANAEROBIC Blood Culture adequate volume Performed at [facility], [place] [address], [place], [place] [address]    Culture NO GROWTH 5 DAYS  Final   Report Status 10/07/2019 FINAL  Final  Respiratory Panel by RT PCR (Flu A&B, Covid) - Nasopharyngeal Swab     Status: None   Collection Time: 10/02/19 10:46 PM   Specimen: Nasopharyngeal Swab  Result Value Ref Range Status   SARS Coronavirus 2 by RT PCR NEGATIVE NEGATIVE Final    Comment: (NOTE) SARS-CoV-2 target nucleic acids are NOT DETECTED. The SARS-CoV-2 RNA is generally detectable in upper respiratoy specimens during the acute phase of infection. The lowest concentration of SARS-CoV-2 viral copies this assay can detect is 131 copies/mL. A negative result does not preclude SARS-Cov-2 infection and should not be used as the sole basis for treatment or other patient management decisions. A negative result may occur with  improper specimen collection/handling, submission of specimen other than nasopharyngeal swab, presence of viral mutation(s) within the areas targeted by this assay, and inadequate number of viral copies (<131 copies/mL). A negative result must be combined with clinical observations, patient history, and epidemiological information. The expected result is Negative. Fact Sheet for Patients:  PinkCheek.be Fact Sheet for Healthcare Providers:  GravelBags.it This test is not yet ap proved or cleared by the [place] FDA and  has been authorized for detection and/or diagnosis of SARS-CoV-2 by FDA under an Emergency Use Authorization (EUA). This EUA will remain  in effect (meaning this test can be used)  for the duration of the COVID-19 declaration under Section 564(b)(1) of the Act, 21 U.S.C. section 360bbb-3(b)(1), unless the authorization is terminated  or revoked sooner.    Influenza A by PCR NEGATIVE NEGATIVE Final   Influenza B by PCR NEGATIVE NEGATIVE Final    Comment: (NOTE) The Xpert Xpress SARS-CoV-2/FLU/RSV assay is intended as an aid in  the diagnosis of influenza from Nasopharyngeal swab specimens and  should not be used as a sole basis for treatment. Nasal washings and  aspirates are unacceptable for Xpert Xpress SARS-CoV-2/FLU/RSV  testing. Fact Sheet for Patients: PinkCheek.be Fact Sheet for Healthcare Providers: GravelBags.it This test is not yet approved or cleared by the [place] FDA and  has been authorized for detection and/or diagnosis of SARS-CoV-2 by  FDA under an Emergency Use Authorization (EUA). This EUA will remain  in effect (meaning this test can be used) for the duration of the  Covid-19 declaration under Section 564(b)(1) of the Act, 21  U.S.C. section 360bbb-3(b)(1), unless the authorization is  terminated or revoked. Performed at [facility], [place] [address] [place], [place] [address]   Blood culture (routine x 2)     Status: None   Collection Time: 10/02/19 11:21 PM   Specimen: BLOOD LEFT HAND  Result Value Ref Range Status   Specimen Description BLOOD LEFT HAND  Final   Special Requests   Final    BOTTLES DRAWN AEROBIC AND ANAEROBIC Blood Culture results may not be optimal due to an inadequate volume of blood received in culture bottles Performed at [place] [address] [place], [place] [address]    Culture NO GROWTH 5 DAYS  Final   Report Status 10/07/2019 FINAL  Final  Urine culture     Status: Abnormal   Collection Time: 10/03/19  1:39 AM   Specimen: In/Out Cath Urine  Result Value Ref Range Status   Specimen Description IN/OUT CATH URINE  Final    Special Requests   Final    NONE Performed at [facility], [place] [address], [place], [place] [address]    Culture >=100,000 COLONIES/mL YEAST (A)  Final   Report Status 10/04/2019 FINAL  Final  MRSA PCR Screening     Status: None   Collection Time: 10/03/19  8:42 PM   Specimen: Nasopharyngeal  Result Value Ref Range Status   MRSA by PCR NEGATIVE NEGATIVE Final    Comment:        The GeneXpert MRSA Assay (FDA approved for NASAL specimens only), is one component of a comprehensive MRSA colonization surveillance program. It is not intended to diagnose MRSA infection nor to guide or monitor treatment for MRSA infections. Performed at [facility], [place] [address], [place], [place] [address]   Surgical pcr screen     Status: None   Collection Time: 10/06/19  8:42 AM   Specimen: Nasal Mucosa; Nasal Swab  Result Value Ref Range Status   MRSA, PCR NEGATIVE NEGATIVE Final   Staphylococcus aureus NEGATIVE NEGATIVE Final    Comment: (NOTE) The Xpert SA Assay (FDA approved for NASAL specimens in patients 74 years of age and older), is one component of a comprehensive surveillance program. It is not intended to diagnose infection nor to guide or monitor treatment. Performed at [facility], [place] [address] [place],  [address]   Aerobic/Anaerobic Culture (surgical/deep wound)     Status: None   Collection Time: 10/06/19 11:02 AM   Specimen: Labia  Result Value Ref Range Status   Specimen Description LABIA  Final   Special Requests FLUID PER RN, RUN TEST REGARDLESS  Final   Gram Stain   Final    ABUNDANT WBC PRESENT,BOTH PMN AND MONONUCLEAR FEW GRAM POSITIVE COCCI FEW GRAM VARIABLE ROD    Culture   Final    FEW VIRIDANS STREPTOCOCCUS RARE KLEBSIELLA PNEUMONIAE FEW PREVOTELLA BIVIA BETA LACTAMASE POSITIVE Performed at [facility], [place] [address], [place],  [address]    Report Status 10/10/2019 FINAL  Final   Organism ID, Bacteria  KLEBSIELLA PNEUMONIAE  Final      Susceptibility   Klebsiella pneumoniae - MIC*    AMPICILLIN >=32 RESISTANT Resistant     CEFAZOLIN <=4 SENSITIVE Sensitive     CEFEPIME <=0.12 SENSITIVE Sensitive     CEFTAZIDIME <=1 SENSITIVE Sensitive     CEFTRIAXONE <=0.25 SENSITIVE Sensitive     CIPROFLOXACIN <=0.25 SENSITIVE Sensitive     GENTAMICIN <=1 SENSITIVE Sensitive     IMIPENEM <=0.25 SENSITIVE Sensitive     TRIMETH/SULFA <=20 SENSITIVE Sensitive     AMPICILLIN/SULBACTAM 16 INTERMEDIATE Intermediate     PIP/TAZO 8 SENSITIVE Sensitive     * RARE KLEBSIELLA PNEUMONIAE   Time coordinating discharge: 35 minutes  SIGNED:  Kerney Elbe, DO [facility] 10/12/2019, 4:01 PM Pager is on AMION  If 7PM-7AM, please contact night-coverage [email or web address] Password [email or web address]

## 2019-10-12 NOTE — Progress Notes (Signed)
SATURATION QUALIFICATIONS: (This note is used to comply with regulatory documentation for home oxygen)  Patient Saturations on Room Air at Rest = 93%  Patient Saturations on Room Air while Ambulating = 91-93%  Patient Saturations on [employer] while ambulating.  Patient was walked from bed in room to the nurses station and back. Patient was able to converse and walk with her walker with no other assistance. No SOB while ambulating. Lowest spo2 noted while ambulating was 91% RA.  Patient tolerated well.

## 2019-10-12 NOTE — TOC Transition Note (Signed)
Transition of Care [facility]) - CM/SW Discharge Note   Patient Details  Name: [name] MRN: [id number] Date of Birth: 1965/08/28  [facility] CM/SW Contact:  Kingsley Plan, RN Phone Number: 10/12/2019, 3:42 PM   Clinical Narrative:     Confirmed with [facility] they are able to accept referral and provide a home health RN visit tomorrow for [facility] dressing change.   Patient received walker from [facility].  TOC working on prescriptions. Will get 30 days free Eliquis. Has Eliquis paperwork and PCP follow up appointment scheduled.        Patient Goals and CMS Choice Patient states their goals for this hospitalization and ongoing recovery are:: to go home CMS Medicare.gov Compare Post Acute Care list provided to:: Patient Choice offered to / list presented to : Patient  Discharge Placement                       Discharge Plan and Services   Discharge Planning Services: CM Consult Post Acute Care Choice: Home Health          DME Arranged: Walker rolling DME Agency: [facility] Date DME Agency Contacted: 10/09/19 Time DME Agency Contacted: 1238 Representative spoke with at DME Agency: [facility] HH Arranged: RN, PT HH Agency: [facility] Date HH Agency Contacted: 10/09/19 Time HH Agency Contacted: 1239 Representative spoke with at [facility] Agency: Left message for Cassie awaiting call back  Social Determinants of Health (SDOH) Interventions     Readmission Risk Interventions No flowsheet data found.

## 2019-10-12 NOTE — Progress Notes (Signed)
This RN provided discharge teaching to this patient and her sister. We went over the wound vac and how to disconnect the tubing to be able to put on clothes.  This RN went over insulin administration and clarified with MD to hold insulin if BG less than 100.  Instructed to call MD for any fever, extreme hunger, pain or swelling as noted in discharge instructions. All belongings were collected and brought down on a cart by [facility] nursing aid.  Patient was in good spirits and all VSS at discharge.

## 2019-10-12 NOTE — Care Management (Signed)
Spoke with Samantha at [facility] , referral faxed awaiting call back.   Jevonte Clanton RN  

## 2019-10-12 NOTE — Progress Notes (Addendum)
  10/12/2019 9:02 AM 6 Days Post-Op  Subjective:  No complaints. Sleeping comfortably when I entered the room   Vitals:   10/12/19 0528 10/12/19 0800  BP: (!) 102/57 108/62  Pulse: 76 65  Resp: 17 19  Temp: 98.8 F (37.1 C) 98.2 F (36.8 C)  SpO2: 99% 97%   Physical Exam: General: Drowsy but well appearing Lungs:  Non labored Incisions: right groin incision clean, dry and intact. No hematoma. No swelling. No ecchymosis Right lateral fasciotomy incision clean and dry with staples. Some erythema along lateral distal staple line. No tenderness. No drainage. Right medial wound with wound VAC distally. [employer]. 250 serosanguinous output Extremities:  2+ femoral pulses, DP pulses palpable bilaterally. BLE well perfused and warm. Motor and sensory intact. No tenderness. No swelling or edema Abdomen: soft, non tender Neurologic: alert and oriented  CBC    Component Value Date/Time   WBC 9.2 10/12/2019 0153   RBC 4.41 10/12/2019 0153   HGB 13.0 10/12/2019 0153   HCT 40.9 10/12/2019 0153   PLT 148 (L) 10/12/2019 0153   MCV 92.7 10/12/2019 0153   MCV 89.0 04/02/2015 1507   MCH 29.5 10/12/2019 0153   MCHC 31.8 10/12/2019 0153   RDW 13.1 10/12/2019 0153   LYMPHSABS 2.0 10/12/2019 0153   MONOABS 0.7 10/12/2019 0153   EOSABS 0.1 10/12/2019 0153   BASOSABS 0.1 10/12/2019 0153    BMET    Component Value Date/Time   NA 140 10/12/2019 0153   K 3.5 10/12/2019 0153   CL 98 10/12/2019 0153   CO2 28 10/12/2019 0153   GLUCOSE 196 (H) 10/12/2019 0153   BUN 10 10/12/2019 0153   CREATININE 0.75 10/12/2019 0153   CREATININE 0.81 04/02/2015 1452   CALCIUM 8.8 (L) 10/12/2019 0153   GFRNONAA >60 10/12/2019 0153   GFRAA >60 10/12/2019 0153    INR    Component Value Date/Time   INR 1.5 (H) 10/04/2019 0428     Intake/Output Summary (Last 24 hours) at 10/12/2019 0902 Last data filed at 10/12/2019 0448 Gross per 24 hour  Intake --  Output 2300 ml  Net -2300 ml      Assessment/Plan:  54 y.o. female is s/p 6 Days Post-Op 1. Exposure right common femoral, sfaand profunda femoris arteries 2.Right lower extremity thromboembolectomy 3.Exposure right popliteal and anterior tibial, peroneal, posterior tibial arteries 4.4 compartment fasciotomies right lower extremity 5. Placement of negative pressure dressing to 4 compartment fasciotomies. 7 Days Post-Op And Closure of lateral fasciotomy site Partial closure of medial fasciotomy site and placement of VAC 6 Days Post-Op  -- pain well controlled, remains afebrile, WBC count has trended down and now off Abx as of 2/24 - She continues to do well from vascular standpoint with good perfusion of bilateral lower extremities. She continues to have motor and sensory intact. Palpable distal pulses bilaterally. -Wound VAC with good seal per WOC. Right lateral fasciotomy incision clean, dry and intact. Some erythema along staples which will continue to observe. Right groin incision clean dry and intact   DVT prophylaxis:  Apixiban   Graceann Congress, PA-C [facility] [phone] 10/12/2019 9:02 AM   I have independently interviewed and examined patient and agree with PA assessment and plan above. Ok for [employer] from vascular standpoint.   Iqra Rotundo C. Randie Heinz, MD [facility] [place] Office: [phone] Pager: [phone]

## 2019-10-12 NOTE — Plan of Care (Signed)
Progressing. Room air saturation 90%. Laying in bed, in no distress.

## 2019-10-12 NOTE — Discharge Instructions (Signed)
Information on my medicine - ELIQUIS (apixaban)  This medication education was reviewed with me or my healthcare representative as part of my discharge preparation.   Why was Eliquis prescribed for you? Eliquis was prescribed to treat blood clots that may have been found in the veins of your legs (deep vein thrombosis) or in your lungs (pulmonary embolism) and to reduce the risk of them occurring again.  What do You need to know about Eliquis ? The starting dose is 10 mg (two 5 mg tablets) taken TWICE daily for the FIRST SEVEN (7) DAYS, then on 10/17/2019 the dose is reduced to ONE 5 mg tablet taken TWICE daily.  Eliquis may be taken with or without food.   Try to take the dose about the same time in the morning and in the evening. If you have difficulty swallowing the tablet whole please discuss with your pharmacist how to take the medication safely.  Take Eliquis exactly as prescribed and DO NOT stop taking Eliquis without talking to the doctor who prescribed the medication.  Stopping may increase your risk of developing a new blood clot.  Refill your prescription before you run out.  After discharge, you should have regular check-up appointments with your healthcare provider that is prescribing your Eliquis.    What do you do if you miss a dose? If a dose of ELIQUIS is not taken at the scheduled time, take it as soon as possible on the same day and twice-daily administration should be resumed. The dose should not be doubled to make up for a missed dose.  Important Safety Information A possible side effect of Eliquis is bleeding. You should call your healthcare provider right away if you experience any of the following: ? Bleeding from an injury or your nose that does not stop. ? Unusual colored urine (red or dark brown) or unusual colored stools (red or black). ? Unusual bruising for unknown reasons. ? A serious fall or if you hit your head (even if there is no bleeding).  Some  medicines may interact with Eliquis and might increase your risk of bleeding or clotting while on Eliquis. To help avoid this, consult your healthcare provider or pharmacist prior to using any new prescription or non-prescription medications, including herbals, vitamins, non-steroidal anti-inflammatory drugs (NSAIDs) and supplements.  This website has more information on Eliquis (apixaban): http://www.eliquis.com/eliquis/home   [facility] will provide home health RN [phone]

## 2019-11-03 ENCOUNTER — Ambulatory Visit (INDEPENDENT_AMBULATORY_CARE_PROVIDER_SITE_OTHER): Payer: Self-pay | Admitting: Physician Assistant

## 2019-11-03 ENCOUNTER — Other Ambulatory Visit: Payer: Self-pay

## 2019-11-03 VITALS — BP 101/68 | HR 104 | Temp 97.8°F | Ht 66.0 in | Wt 231.3 lb

## 2019-11-03 DIAGNOSIS — I998 Other disorder of circulatory system: Secondary | ICD-10-CM

## 2019-11-03 NOTE — Progress Notes (Signed)
POST OPERATIVE OFFICE NOTE    CC:  F/u for surgery  HPI:  This is a 54 y.o. female who is s/p right lower extremity thrombectomy with right lower leg fasciotomy October 04, 2019.  She presents today for fasciotomy site wound check.  She states she has been getting along fairly well but does continue to have some discomfort at her wound site particularly with vacuum dressing changes.  She is taking her Eliquis as instructed.  Allergies  Allergen Reactions  . Codeine   . Guaifenesin Hives    Current Outpatient Medications  Medication Sig Dispense Refill  . albuterol (VENTOLIN HFA) 108 (90 Base) MCG/ACT inhaler INHALE TWO PUFFS BY MOUTH 4 TIMES DAILY AS NEEDED.  Pt prefers ventolin brand 18 g 0  . apixaban (ELIQUIS) 5 MG TABS tablet Take 10 mg (Two 5 mg tab) twice daily for 1 week and then 5 mg po BID from then on starting 10/17/19 70 tablet 0  . atorvastatin (LIPITOR) 10 MG tablet Take 1 tablet (10 mg total) by mouth daily. 90 tablet 3  . beclomethasone (QVAR) 40 MCG/ACT inhaler Inhale 1 puff into the lungs 2 (two) times daily. 1 Inhaler 6  . blood glucose meter kit and supplies KIT Dispense based on patient and insurance preference. Use up to four times daily as directed. (FOR ICD-9 250.00, 250.01). 1 each 0  . EPINEPHrine (PRIMATENE MIST) 0.125 MG/ACT AERO Inhale 2 puffs into the lungs every 4 (four) hours as needed (BREATHING ISSUES).    . Insulin Isophane & Regular Human (NOVOLIN 70/30 FLEXPEN RELION) (70-30) 100 UNIT/ML PEN Inject 10 Units into the skin in the morning and at bedtime. 15 mL 11  . Insulin Pen Needle 32G X 4 MM MISC 10 Units by Does not apply route in the morning and at bedtime. 100 each 0  . lisinopril (ZESTRIL) 2.5 MG tablet Take 1 tablet (2.5 mg total) by mouth daily. 90 tablet 3  . loratadine (CLARITIN) 10 MG tablet Take 10 mg by mouth daily.    . metFORMIN (GLUCOPHAGE) 500 MG tablet Take 2 tablets (1,000 mg total) by mouth 2 (two) times daily with a meal. 180 tablet 3   . montelukast (SINGULAIR) 10 MG tablet Take 1 tablet (10 mg total) by mouth at bedtime. 90 tablet 3  . oxyCODONE (OXY IR/ROXICODONE) 5 MG immediate release tablet Take 1-2 tablets (5-10 mg total) by mouth every 4 (four) hours as needed for moderate pain. 10 tablet 0  . pantoprazole (PROTONIX) 40 MG tablet Take 1 tablet (40 mg total) by mouth daily. 30 tablet 0  . sertraline (ZOLOFT) 50 MG tablet Take 3 tablets (150 mg total) by mouth daily. 30 tablet 0   No current facility-administered medications for this visit.     ROS:  See HPI  Physical Exam:  Vitals:   11/03/19 1357  Weight: 231 lb 4.8 oz (104.9 kg)  Height: '5\' 6"'  (1.676 m)   Incision: Right lateral lower leg incision is healing and she has several areas of ischemic skin edges but her wound is well approximated.  wound VAC is in place on the medial osteotomy site and the proximal suture line is well approximated.  There is erythema secondary to staples. Extremities: She has moderate edema of the right lower leg.  Positive dorsalis pedis and posterior tibial Doppler signals.  Her foot is pink and warm Neuro: Oriented x4   Her wound today measures 10.5 cm x 4 cm x 1/2 cm  Assessment/Plan:  This is a 54 y.o. female who is s/p: Right lower extremity thrombectomy, 4 compartment right lower leg fasciotomies.  The wound VAC was removed and replaced.  All surgical staples and one remaining nylon suture were removed today.  Continue wound VAC as instructed.  I told her she would benefit from Ace wrap at time of wound VAC changes.   -Follow-up in 2 to 3 weeks   Jannet Mantis, PA-C [facility] [phone]  Clinic MD: Don[facility] Matters

## 2019-11-06 ENCOUNTER — Other Ambulatory Visit: Payer: Self-pay

## 2019-11-06 MED ORDER — OXYCODONE-ACETAMINOPHEN 10-325 MG PO TABS: 1.0000 | ORAL_TABLET | Freq: Four times a day (QID) | ORAL | 0 refills | Status: DC | PRN

## 2019-11-06 NOTE — Addendum Note (Signed)
Addended by: Milinda Antis on: 11/06/2019 04:40 PM   Modules accepted: Orders

## 2019-11-13 ENCOUNTER — Encounter (HOSPITAL_COMMUNITY): Payer: Self-pay | Admitting: *Deleted

## 2019-11-13 ENCOUNTER — Inpatient Hospital Stay (HOSPITAL_COMMUNITY)
Admission: EM | Admit: 2019-11-13 | Discharge: 2019-11-15 | DRG: 603 | Disposition: A | Discharge status: Home Health | Payer: Self-pay | Attending: Vascular Surgery | Admitting: Vascular Surgery

## 2019-11-13 ENCOUNTER — Emergency Department (HOSPITAL_COMMUNITY): Payer: Self-pay

## 2019-11-13 ENCOUNTER — Other Ambulatory Visit: Payer: Self-pay

## 2019-11-13 DIAGNOSIS — F419 Anxiety disorder, unspecified: Secondary | ICD-10-CM | POA: Diagnosis present

## 2019-11-13 DIAGNOSIS — Z794 Long term (current) use of insulin: Secondary | ICD-10-CM

## 2019-11-13 DIAGNOSIS — Z7901 Long term (current) use of anticoagulants: Secondary | ICD-10-CM

## 2019-11-13 DIAGNOSIS — Z86718 Personal history of other venous thrombosis and embolism: Secondary | ICD-10-CM

## 2019-11-13 DIAGNOSIS — Z79891 Long term (current) use of opiate analgesic: Secondary | ICD-10-CM

## 2019-11-13 DIAGNOSIS — Z79899 Other long term (current) drug therapy: Secondary | ICD-10-CM

## 2019-11-13 DIAGNOSIS — Z20822 Contact with and (suspected) exposure to covid-19: Secondary | ICD-10-CM | POA: Diagnosis present

## 2019-11-13 DIAGNOSIS — F17210 Nicotine dependence, cigarettes, uncomplicated: Secondary | ICD-10-CM | POA: Diagnosis present

## 2019-11-13 DIAGNOSIS — Z885 Allergy status to narcotic agent status: Secondary | ICD-10-CM

## 2019-11-13 DIAGNOSIS — M7989 Other specified soft tissue disorders: Secondary | ICD-10-CM | POA: Diagnosis present

## 2019-11-13 DIAGNOSIS — J45909 Unspecified asthma, uncomplicated: Secondary | ICD-10-CM | POA: Diagnosis present

## 2019-11-13 DIAGNOSIS — L03115 Cellulitis of right lower limb: Principal | ICD-10-CM | POA: Diagnosis present

## 2019-11-13 DIAGNOSIS — Z9851 Tubal ligation status: Secondary | ICD-10-CM

## 2019-11-13 DIAGNOSIS — E119 Type 2 diabetes mellitus without complications: Secondary | ICD-10-CM | POA: Diagnosis present

## 2019-11-13 DIAGNOSIS — Z9049 Acquired absence of other specified parts of digestive tract: Secondary | ICD-10-CM

## 2019-11-13 DIAGNOSIS — F329 Major depressive disorder, single episode, unspecified: Secondary | ICD-10-CM | POA: Diagnosis present

## 2019-11-13 DIAGNOSIS — Z888 Allergy status to other drugs, medicaments and biological substances status: Secondary | ICD-10-CM

## 2019-11-13 LAB — CBC WITH DIFFERENTIAL/PLATELET
Abs Immature Granulocytes: 0.02 10*3/uL (ref 0.00–0.07)
Basophils Absolute: 0 10*3/uL (ref 0.0–0.1)
Basophils Relative: 1 %
Eosinophils Absolute: 0.1 10*3/uL (ref 0.0–0.5)
Eosinophils Relative: 1 %
HCT: 38.8 % (ref 36.0–46.0)
Hemoglobin: 12.1 g/dL (ref 12.0–15.0)
Immature Granulocytes: 0 %
Lymphocytes Relative: 10 %
Lymphs Abs: 0.9 10*3/uL (ref 0.7–4.0)
MCH: 29.7 pg (ref 26.0–34.0)
MCHC: 31.2 g/dL (ref 30.0–36.0)
MCV: 95.1 fL (ref 80.0–100.0)
Monocytes Absolute: 0.4 10*3/uL (ref 0.1–1.0)
Monocytes Relative: 5 %
Neutro Abs: 7.1 10*3/uL (ref 1.7–7.7)
Neutrophils Relative %: 83 %
Platelets: 113 10*3/uL — ABNORMAL LOW (ref 150–400)
RBC: 4.08 MIL/uL (ref 3.87–5.11)
RDW: 14 % (ref 11.5–15.5)
WBC: 8.5 10*3/uL (ref 4.0–10.5)
nRBC: 0 % (ref 0.0–0.2)

## 2019-11-13 LAB — LACTIC ACID, PLASMA
Lactic Acid, Venous: 1.1 mmol/L (ref 0.5–1.9)
Lactic Acid, Venous: 1 mmol/L (ref 0.5–1.9)

## 2019-11-13 LAB — CBG MONITORING, ED: Glucose-Capillary: 153 mg/dL — ABNORMAL HIGH (ref 70–99)

## 2019-11-13 LAB — COMPREHENSIVE METABOLIC PANEL
ALT: 15 U/L (ref 0–44)
AST: 14 U/L — ABNORMAL LOW (ref 15–41)
Albumin: 3.1 g/dL — ABNORMAL LOW (ref 3.5–5.0)
Alkaline Phosphatase: 95 U/L (ref 38–126)
Anion gap: 8 (ref 5–15)
BUN: 8 mg/dL (ref 6–20)
CO2: 28 mmol/L (ref 22–32)
Calcium: 8.9 mg/dL (ref 8.9–10.3)
Chloride: 106 mmol/L (ref 98–111)
Creatinine, Ser: 0.62 mg/dL (ref 0.44–1.00)
GFR calc Af Amer: >60 mL/min (ref >60)
GFR calc non Af Amer: >60 mL/min (ref >60)
Glucose, Bld: 145 mg/dL — ABNORMAL HIGH (ref 70–99)
Potassium: 3.7 mmol/L (ref 3.5–5.1)
Sodium: 142 mmol/L (ref 135–145)
Total Bilirubin: 0.7 mg/dL (ref 0.3–1.2)
Total Protein: 6.6 g/dL (ref 6.5–8.1)

## 2019-11-13 LAB — SARS CORONAVIRUS 2 (TAT 6-24 HRS): SARS Coronavirus 2: NEGATIVE

## 2019-11-13 MED ORDER — SERTRALINE HCL 50 MG PO TABS
150.0000 mg | ORAL_TABLET | Freq: Every day | ORAL | Status: DC
  Administered 2019-11-13 – 2019-11-15 (×3): 150 mg via ORAL
  Filled 2019-11-13 (×3): qty 1

## 2019-11-13 MED ORDER — PIPERACILLIN-TAZOBACTAM 3.375 G IVPB
3.3750 g | Freq: Three times a day (TID) | INTRAVENOUS | Status: DC
  Administered 2019-11-13 – 2019-11-15 (×5): 3.375 g via INTRAVENOUS
  Filled 2019-11-13 (×5): qty 50

## 2019-11-13 MED ORDER — VANCOMYCIN HCL 1500 MG/300ML IV SOLN
1500.0000 mg | INTRAVENOUS | Status: DC
  Administered 2019-11-13 – 2019-11-14 (×2): 1500 mg via INTRAVENOUS
  Filled 2019-11-13 (×2): qty 300

## 2019-11-13 MED ORDER — LISINOPRIL 2.5 MG PO TABS
2.5000 mg | ORAL_TABLET | Freq: Every day | ORAL | Status: DC
  Administered 2019-11-13 – 2019-11-15 (×3): 2.5 mg via ORAL
  Filled 2019-11-13 (×3): qty 1

## 2019-11-13 MED ORDER — POTASSIUM CHLORIDE CRYS ER 20 MEQ PO TBCR
20.0000 meq | EXTENDED_RELEASE_TABLET | Freq: Once | ORAL | Status: AC
  Administered 2019-11-13: 20 meq via ORAL
  Filled 2019-11-13: qty 1

## 2019-11-13 MED ORDER — ALBUTEROL SULFATE (2.5 MG/3ML) 0.083% IN NEBU: 2.5000 mg | INHALATION_SOLUTION | Freq: Four times a day (QID) | RESPIRATORY_TRACT | Status: DC | PRN

## 2019-11-13 MED ORDER — METOPROLOL TARTRATE 5 MG/5ML IV SOLN: 2.0000 mg | INTRAVENOUS | Status: DC | PRN

## 2019-11-13 MED ORDER — METFORMIN HCL 500 MG PO TABS
1000.0000 mg | ORAL_TABLET | Freq: Two times a day (BID) | ORAL | Status: DC
  Administered 2019-11-14 – 2019-11-15 (×3): 1000 mg via ORAL
  Filled 2019-11-13 (×3): qty 2

## 2019-11-13 MED ORDER — PANTOPRAZOLE SODIUM 40 MG PO TBEC
40.0000 mg | DELAYED_RELEASE_TABLET | Freq: Every day | ORAL | Status: DC
  Administered 2019-11-13 – 2019-11-15 (×3): 40 mg via ORAL
  Filled 2019-11-13 (×3): qty 1

## 2019-11-13 MED ORDER — HYDRALAZINE HCL 20 MG/ML IJ SOLN: 5.0000 mg | INTRAMUSCULAR | Status: DC | PRN

## 2019-11-13 MED ORDER — ATORVASTATIN CALCIUM 10 MG PO TABS
10.0000 mg | ORAL_TABLET | Freq: Every day | ORAL | Status: DC
  Administered 2019-11-13 – 2019-11-15 (×3): 10 mg via ORAL
  Filled 2019-11-13 (×3): qty 1

## 2019-11-13 MED ORDER — PHENOL 1.4 % MT LIQD: 1.0000 | OROMUCOSAL | Status: DC | PRN

## 2019-11-13 MED ORDER — INSULIN ISOPHANE & REGULAR (HUMAN 70-30)100 UNIT/ML KWIKPEN: 10.0000 [IU] | PEN_INJECTOR | Freq: Two times a day (BID) | SUBCUTANEOUS | Status: DC

## 2019-11-13 MED ORDER — SODIUM CHLORIDE 0.9% FLUSH: 3.0000 mL | INTRAVENOUS | Status: DC | PRN

## 2019-11-13 MED ORDER — HEPARIN (PORCINE) 25000 UT/250ML-% IV SOLN
1550.0000 [IU]/h | INTRAVENOUS | Status: DC
  Administered 2019-11-13: 1400 [IU]/h via INTRAVENOUS
  Administered 2019-11-14: 1550 [IU]/h via INTRAVENOUS
  Filled 2019-11-13 (×2): qty 250

## 2019-11-13 MED ORDER — LORATADINE 10 MG PO TABS
10.0000 mg | ORAL_TABLET | Freq: Every day | ORAL | Status: DC
  Administered 2019-11-13 – 2019-11-15 (×3): 10 mg via ORAL
  Filled 2019-11-13 (×3): qty 1

## 2019-11-13 MED ORDER — SODIUM CHLORIDE 0.9% FLUSH
3.0000 mL | Freq: Two times a day (BID) | INTRAVENOUS | Status: DC
  Administered 2019-11-14: 3 mL via INTRAVENOUS

## 2019-11-13 MED ORDER — PANTOPRAZOLE SODIUM 40 MG PO TBEC: 40.0000 mg | DELAYED_RELEASE_TABLET | Freq: Every day | ORAL | Status: DC

## 2019-11-13 MED ORDER — MONTELUKAST SODIUM 10 MG PO TABS
10.0000 mg | ORAL_TABLET | Freq: Every day | ORAL | Status: DC
  Administered 2019-11-13 – 2019-11-14 (×2): 10 mg via ORAL
  Filled 2019-11-13 (×3): qty 1

## 2019-11-13 MED ORDER — SODIUM CHLORIDE 0.9 % IV SOLN: 250.0000 mL | INTRAVENOUS | Status: DC | PRN

## 2019-11-13 MED ORDER — ENOXAPARIN SODIUM 40 MG/0.4ML ~~LOC~~ SOLN: 40.0000 mg | SUBCUTANEOUS | Status: DC

## 2019-11-13 MED ORDER — BECLOMETHASONE DIPROP HFA 40 MCG/ACT IN AERB: 1.0000 | INHALATION_SPRAY | Freq: Every day | RESPIRATORY_TRACT | Status: DC

## 2019-11-13 MED ORDER — INSULIN ASPART PROT & ASPART (70-30 MIX) 100 UNIT/ML ~~LOC~~ SUSP
10.0000 [IU] | Freq: Two times a day (BID) | SUBCUTANEOUS | Status: DC
  Administered 2019-11-15: 10 [IU] via SUBCUTANEOUS
  Filled 2019-11-13 (×2): qty 10

## 2019-11-13 MED ORDER — EPINEPHRINE 0.125 MG/ACT IN AERO: 2.0000 | INHALATION_SPRAY | RESPIRATORY_TRACT | Status: DC | PRN

## 2019-11-13 MED ORDER — LABETALOL HCL 5 MG/ML IV SOLN: 10.0000 mg | INTRAVENOUS | Status: DC | PRN

## 2019-11-13 MED ORDER — OXYCODONE-ACETAMINOPHEN 5-325 MG PO TABS
1.0000 | ORAL_TABLET | ORAL | Status: DC | PRN
  Administered 2019-11-13 – 2019-11-15 (×4): 2 via ORAL
  Filled 2019-11-13 (×4): qty 2

## 2019-11-13 MED ORDER — DOCUSATE SODIUM 100 MG PO CAPS
100.0000 mg | ORAL_CAPSULE | Freq: Two times a day (BID) | ORAL | Status: DC
  Administered 2019-11-14 – 2019-11-15 (×2): 100 mg via ORAL
  Filled 2019-11-13 (×4): qty 1

## 2019-11-13 MED ORDER — INSULIN ASPART 100 UNIT/ML ~~LOC~~ SOLN
0.0000 [IU] | Freq: Three times a day (TID) | SUBCUTANEOUS | Status: DC
  Administered 2019-11-14 (×2): 3 [IU] via SUBCUTANEOUS

## 2019-11-13 MED ORDER — GUAIFENESIN-DM 100-10 MG/5ML PO SYRP: 15.0000 mL | ORAL_SOLUTION | ORAL | Status: DC | PRN

## 2019-11-13 MED ORDER — ACETAMINOPHEN 325 MG PO TABS: 325.0000 mg | ORAL_TABLET | ORAL | Status: DC | PRN

## 2019-11-13 MED ORDER — MORPHINE SULFATE (PF) 2 MG/ML IV SOLN: 2.0000 mg | INTRAVENOUS | Status: DC | PRN

## 2019-11-13 MED ORDER — ONDANSETRON HCL 4 MG/2ML IJ SOLN: 4.0000 mg | Freq: Four times a day (QID) | INTRAMUSCULAR | Status: DC | PRN

## 2019-11-13 MED ORDER — ACETAMINOPHEN 325 MG RE SUPP: 325.0000 mg | RECTAL | Status: DC | PRN

## 2019-11-13 MED ORDER — ALUM & MAG HYDROXIDE-SIMETH 200-200-20 MG/5ML PO SUSP: 15.0000 mL | ORAL | Status: DC | PRN

## 2019-11-13 NOTE — ED Triage Notes (Signed)
Pt arrived by [facility] from home. Pt has wound vac to right lower leg, which she has due to blood clots. Home health rn sent her today due to increase in pain, no increase in problems with wounds/fever etc.

## 2019-11-13 NOTE — ED Notes (Addendum)
Pt placed on 2L [place] d/t Spo2 dropping into the 80s while resting. Will continue to monitor.

## 2019-11-13 NOTE — Progress Notes (Addendum)
ANTICOAGULATION CONSULT NOTE - Initial Consult  Pharmacy Consult for heparin Indication: DVT  Allergies  Allergen Reactions  . Codeine   . Guaifenesin Hives    Patient Measurements: Height: 5\' 6"  (167.6 cm) Weight: 230 lb (104.3 kg) IBW/kg (Calculated) : 59.3 Heparin Dosing Weight: 83.2kg  Vital Signs: Temp: 98.7 F (37.1 C) (03/29 1323) Temp Source: Oral (03/29 1323) BP: 132/69 (03/29 1743) Pulse Rate: 84 (03/29 1743)  Labs: Recent Labs    11/13/19 1345  HGB 12.1  HCT 38.8  PLT 113*  CREATININE 0.62    Estimated Creatinine Clearance: 97 mL/min (by C-G formula based on SCr of 0.62 mg/dL).   Medical History: Past Medical History:  Diagnosis Date  . Allergy   . Anxiety   . Asthma   . Depression   . Diabetes mellitus without complication (HCC)    Assessment: 47 YOF presenting with RLE redness s/p thrombectomy + fasciotomy.  On Eliquis PTA for DVT/ischemic leg with last dose 3/28 @2100 .  H/H wnl, plts 113 (chronic).    Goal of Therapy:  Heparin level 0.3-0.7 units/ml aPTT 66-102 seconds Monitor platelets by anticoagulation protocol: Yes   Plan:  Heparin gtt at 1400 units/hr, no bolus F/u 6 hour aPTT  4/28, PharmD Clinical Pharmacist ED Pharmacist Phone # [phone] 11/13/2019 6:43 PM

## 2019-11-13 NOTE — ED Provider Notes (Signed)
MSE was initiated and I personally evaluated the patient and placed orders (if any) at  1:32 PM on November 13, 2019.  55 year old female who presents to the ED today via EMS for gradual onset, constant, redness to RLE that began today. Per chart review pt admitted to the hospital on 2/15 for sepsis s/2 perineal abscess/abdominal wall cellulitis. During hospitalization on 2/17 pt was found to have ischemic RLE; taken to the OR for RLE thrombectomy and fasciotomy by vascular surgery. Wound vac was placed on 2/19. Pt was discharged from the hospital on 2/25 and has been following with vascular surgeon Dr. Randie Heinz since then. Most recent visit on 3/19 with Wendi Maya PA-C with vascular surgery for wound check. Wound vac was removed and replaced at that time and all surgical staples/remaining 1 nylon suture removed. Plan for follow up in 2-3 weeks.   Pt endorses her wound care nurse came to her home today for wound vac change and noticed the redness to pt's leg. Pt also endorses worsening pain to her leg that started today. She denies fevers or chills.   Physical Exam  Constitutional: She is well-developed, well-nourished, and in no distress.  HENT:  Head: Normocephalic and atraumatic.  Cardiovascular: Normal rate, regular rhythm and normal heart sounds.  No murmur heard. Pulmonary/Chest: Effort normal. No respiratory distress. She has no wheezes. She has no rales.  Musculoskeletal:     Comments: RLE with wound vac in place. Erythema and increased warmth circumferentially to right lower leg with TTP to light touch. 2+ DP and PT pulses.   Nursing note and vitals reviewed.  Plan: Concern for cellulitis at this time with increased warmth/redness to RLE. Pt does have good distal pulses and has been compliant on her blood thinners. Will work up for infection today with labs, blood cultures, and xray to rule out osteo. Pt to be further evaluated once she is taken to the main ED.   The patient appears stable  so that the remainder of the MSE may be completed by another provider.   Tanda Rockers, PA-C 11/13/19 1338    Gwyneth Sprout, MD 11/13/19 1404

## 2019-11-13 NOTE — ED Provider Notes (Signed)
[facility] EMERGENCY DEPARTMENT Provider Note   CSN: [id number] Arrival date & time: 11/13/19  1319     History Chief Complaint  Patient presents with  . Leg Pain    [name] is a 54 y.o. female with past medical history of DM, chronic bronchitis, who presents today for evaluation of right leg redness and pain. History obtained from patient, chart review. Patient was admitted to the hospital on 2/15 for sepsis from a perineal abscess and abdominal wall cellulitis, however on 2/17 she was found to have an ischemic right lower extremity and had a thrombectomy and fasciotomy.  Wound VAC was placed on 219. She was seen most recently 10 days ago by vascular surgery PA for a wound check. She reports that since last night she has had increasing pain in her right leg.  She states that her home health nurse came today and noted that her leg was significantly more red than it was during her last visit.  Home health changes her wound VAC 3 times a week. Patient denies any fevers.  No weakness, numbness, or tingling that is new.   No aggravating or alleviating factors noted.  The pain does not radiate or move.  HPI     Past Medical History:  Diagnosis Date  . Allergy   . Anxiety   . Asthma   . Depression   . Diabetes mellitus without complication [facility])     Patient Active Problem List   Diagnosis Date Noted  . Cellulitis of right leg 11/13/2019  . Abscess of deep perineal space 10/03/2019  . Sepsis ([place]) 10/03/2019  . Uncontrolled type 2 diabetes mellitus with hyperosmolar nonketotic hyperglycemia ([place]) 10/03/2019  . Delirium 10/03/2019  . Hypernatremia 10/03/2019  . AKI (acute kidney injury) ([place])   . High anion gap metabolic acidosis   . Type 2 diabetes mellitus without complication ([place]) 33/35/4562  . Elevated cholesterol with elevated triglycerides 04/03/2015  . Chronic bronchitis ([place]) 12/31/2014  . Smoking 01/22/2013  . Mood disorder ([place]) 06/12/2012   . Bronchospasm 06/12/2012    Past Surgical History:  Procedure Laterality Date  . APPLICATION OF WOUND VAC Right 10/04/2019   Procedure: Application Of Wound Vac;  Surgeon: Waynetta Sandy, MD;  Location: [facility];  Service: Vascular;  Laterality: Right;  . APPLICATION OF WOUND VAC Right 10/06/2019   Procedure: Application Of Wound Vac;  Surgeon: Angelia Mould, MD;  Location: [facility];  Service: Vascular;  Laterality: Right;  . CHOLECYSTECTOMY    . FASCIOTOMY CLOSURE Right 10/06/2019   Procedure: FASCIOTOMY CLOSURE Right Lateral Lower leg.;  Surgeon: Angelia Mould, MD;  Location: [facility];  Service: Vascular;  Laterality: Right;  . THIGH FASCIOTOMY Right 10/04/2019   Procedure: Right leg Fasciotomy;  Surgeon: Waynetta Sandy, MD;  Location: [facility];  Service: Vascular;  Laterality: Right;  . THROMBECTOMY FEMORAL ARTERY Right 10/04/2019   Procedure: RIGHT LEG THROMBECTOMY;  Surgeon: Waynetta Sandy, MD;  Location: [facility];  Service: Vascular;  Laterality: Right;  . TUBAL LIGATION       OB History   No obstetric history on file.     Family History  Problem Relation Age of Onset  . Stroke Mother   . Heart disease Father     Social History   Tobacco Use  . Smoking status: Current Every Day Smoker    Packs/day: 1.00    Years: 34.00    Pack years: 34.00    Types: Cigarettes  .  Smokeless tobacco: Never Used  Substance Use Topics  . Alcohol use: No    Alcohol/week: 0.0 standard drinks  . Drug use: No    Home Medications Prior to Admission medications   Medication Sig Start Date End Date Taking? Authorizing Provider  albuterol (VENTOLIN HFA) 108 (90 Base) MCG/ACT inhaler INHALE TWO PUFFS BY MOUTH 4 TIMES DAILY AS NEEDED.  Pt prefers ventolin brand Patient taking differently: Inhale 2 puffs into the lungs every 6 (six) hours as needed for wheezing or shortness of breath.  10/12/19  Yes Sheikh, Omair Latif, DO  apixaban (ELIQUIS) 5 MG TABS tablet  Take 10 mg (Two 5 mg tab) twice daily for 1 week and then 5 mg po BID from then on starting 10/17/19 Patient taking differently: Take 5 mg by mouth 2 (two) times daily.  10/12/19  Yes Sheikh, Omair Latif, DO  atorvastatin (LIPITOR) 10 MG tablet Take 1 tablet (10 mg total) by mouth daily. 11/23/15  Yes Leandrew Koyanagi, MD  EPINEPHrine (PRIMATENE MIST) 0.125 MG/ACT AERO Inhale 2 puffs into the lungs every 4 (four) hours as needed (BREATHING ISSUES).   Yes [provider]  Insulin Isophane & Regular Human (NOVOLIN 70/30 FLEXPEN RELION) (70-30) 100 UNIT/ML PEN Inject 10 Units into the skin in the morning and at bedtime. 10/12/19  Yes Sheikh, Omair Latif, DO  lisinopril (ZESTRIL) 2.5 MG tablet Take 1 tablet (2.5 mg total) by mouth daily. 10/12/19  Yes Sheikh, Omair Latif, DO  loratadine (CLARITIN) 10 MG tablet Take 10 mg by mouth daily.   Yes [provider]  metFORMIN (GLUCOPHAGE) 500 MG tablet Take 2 tablets (1,000 mg total) by mouth 2 (two) times daily with a meal. 10/12/19  Yes Sheikh, Omair Latif, DO  montelukast (SINGULAIR) 10 MG tablet Take 1 tablet (10 mg total) by mouth at bedtime. 10/12/19  Yes Sheikh, Omair Latif, DO  oxyCODONE-acetaminophen (PERCOCET) 10-325 MG tablet Take 1 tablet by mouth every 6 (six) hours as needed for pain (take one tab 30 minutes before dressing change). 11/06/19  Yes Setzer, Edman Circle, PA-C  pantoprazole (PROTONIX) 40 MG tablet Take 1 tablet (40 mg total) by mouth daily. 10/13/19  Yes Sheikh, Omair Latif, DO  QVAR REDIHALER 40 MCG/ACT inhaler Inhale 1 puff into the lungs daily.  10/12/19  Yes [provider]  sertraline (ZOLOFT) 50 MG tablet Take 3 tablets (150 mg total) by mouth daily. 10/13/19  Yes Sheikh, Omair Latif, DO  beclomethasone (QVAR) 40 MCG/ACT inhaler Inhale 1 puff into the lungs 2 (two) times daily. Patient not taking: Reported on 11/13/2019 10/12/19   Raiford Noble Latif, DO  blood glucose meter kit and supplies KIT Dispense based on  patient and insurance preference. Use up to four times daily as directed. (FOR ICD-9 250.00, 250.01). 04/02/15   McVeigh, Todd, PA  Insulin Pen Needle 32G X 4 MM MISC 10 Units by Does not apply route in the morning and at bedtime. 10/12/19   Raiford Noble Latif, DO  oxyCODONE (OXY IR/ROXICODONE) 5 MG immediate release tablet Take 1-2 tablets (5-10 mg total) by mouth every 4 (four) hours as needed for moderate pain. Patient not taking: Reported on 11/13/2019 10/12/19   Raiford Noble Latif, DO    Allergies    Codeine and Guaifenesin  Review of Systems   Review of Systems  Constitutional: Negative for chills and fever.  Respiratory: Positive for cough (Chronic, unchanged). Negative for chest tightness and shortness of breath.   Cardiovascular: Positive for leg swelling.  Genitourinary: Negative for dysuria and urgency.  Musculoskeletal: Negative for back pain.  Skin: Positive for color change and wound.  Neurological: Negative for weakness and headaches.  All other systems reviewed and are negative.   Physical Exam Updated Vital Signs BP 132/69   Pulse 84   Temp 98.7 F (37.1 C) (Oral)   Resp 18   Ht '5\' 6"'  (1.676 m)   Wt 104.3 kg   SpO2 95%   BMI 37.12 kg/m   Physical Exam Vitals and nursing note reviewed.  Constitutional:      General: She is not in acute distress.    Appearance: She is well-developed. She is not diaphoretic.  HENT:     Head: Normocephalic and atraumatic.  Eyes:     General: No scleral icterus.       Right eye: No discharge.        Left eye: No discharge.     Conjunctiva/sclera: Conjunctivae normal.  Cardiovascular:     Rate and Rhythm: Normal rate and regular rhythm.     Pulses:          Dorsalis pedis pulses are detected w/ Doppler on the right side and 1+ on the left side.       Posterior tibial pulses are detected w/ Doppler on the right side and 1+ on the left side.  Pulmonary:     Effort: Pulmonary effort is normal. No respiratory distress.      Breath sounds: No stridor.     Comments: Frequent cough Abdominal:     General: There is no distension.  Musculoskeletal:        General: No deformity.     Cervical back: Normal range of motion.     Right lower leg: 2+ Edema present.     Comments: Patient is able to move bilateral toes and ankles.  There is obvious edema of the RLL.    Skin:    General: Skin is warm and dry.     Comments: Please see clinical picture, there is obvious erythema around the right lower extremity around the wound VAC that is present.  Erythema extends circumferentially around the leg.  Erythema is limited to area between right ankle and knee.  There are multiple scabs on the posterior lateral aspect of the leg without drainage.  Neurological:     Mental Status: She is alert.     Motor: No abnormal muscle tone.     Comments: Sensation intact to light touch to bilateral lower extremities.   Psychiatric:        Behavior: Behavior normal.           ED Results / Procedures / Treatments   Labs (all labs ordered are listed, but only abnormal results are displayed) Labs Reviewed  COMPREHENSIVE METABOLIC PANEL - Abnormal; Notable for the following components:      Result Value   Glucose, Bld 145 (*)    Albumin 3.1 (*)    AST 14 (*)    All other components within normal limits  CBC WITH DIFFERENTIAL/PLATELET - Abnormal; Notable for the following components:   Platelets 113 (*)    All other components within normal limits  CULTURE, BLOOD (ROUTINE X 2)  CULTURE, BLOOD (ROUTINE X 2)  LACTIC ACID, PLASMA  LACTIC ACID, PLASMA  URINALYSIS, ROUTINE W REFLEX MICROSCOPIC  HEMOGLOBIN A1C    EKG None  Radiology DG Tibia/Fibula Right  Result Date: 11/13/2019 CLINICAL DATA:  Right lower extremity swelling and erythema around wound VAC.  Patient reports blood clot approximately 6 weeks ago. Assess for osteomyelitis. EXAM: RIGHT TIBIA AND FIBULA - 2 VIEW COMPARISON:  None. FINDINGS: A wound VAC is noted  superficially in the medial soft tissues of the mid lower leg. There are adjacent vascular clips. No other foreign body or soft tissue emphysema identified. The soft tissues are diffusely edematous. No evidence of acute fracture, dislocation or bone destruction. No significant arthropathic changes at the knee or ankle. IMPRESSION: Superficial wound VAC in the medial soft tissues of the mid lower leg with nonspecific diffuse soft tissue edema, possibly cellulitis. No evidence of osteomyelitis. Electronically Signed   By: Richardean Sale M.D.   On: 11/13/2019 14:15    Procedures Procedures (including critical care time)  Medications Ordered in ED Medications    ED Course  I have reviewed the triage vital signs and the nursing notes.  Pertinent labs & imaging results that were available during my care of the patient were reviewed by me and considered in my medical decision making (see chart for details).    MDM Rules/Calculators/A&P                     Patient is a 54 year old woman who presents today for evaluation of right leg erythema and increased pain.  She has a wound VAC present.  Wound VAC has been present since she had a right leg thrombectomy and fasciotomy that was partially closed in February.  Labs are obtained and reviewed, CBC and CMP without significant hematologic or electrolyte derangements.  Lactic acid is not elevated.  Blood cultures were ordered in triage before I saw patient. X-ray ordered from triage shows nonspecific diffuse soft tissue edema consistent with cellulitis and wound VAC present.  I spoke with Dr. Oneida Alar, on-call for vascular surgery who saw the patient and will be admitting her.  .Note: Portions of this report may have been transcribed using voice recognition software. Every effort was made to ensure accuracy; however, inadvertent computerized transcription errors may be present  Final Clinical Impression(s) / ED Diagnoses Final diagnoses:  Cellulitis  of right lower extremity    Rx / DC Orders ED Discharge Orders    None       Ollen Gross 11/13/19 1844    Virgel Manifold, MD 11/13/19 2211

## 2019-11-13 NOTE — H&P (Signed)
Patient name: [name] MRN: [id number] DOB: Jan 06, 1966 Sex: female  HPI: [name] is a 54 y.o. female, who presented to the ER today with increasing redness swelling and pain in her right leg.  She underwent right lower extremity popliteal and tibial embolectomy by Dr. Donzetta Matters on October 04, 2019.  She then had her lateral fasciotomy closed by Dr. Scot Dock 2 days later.  The patient had been at home with a VAC dressing on the medial fasciotomy.  She was seen in our office a few days ago and this was healthy in appearance.  However, over the last 24 hours she has developed increased redness around the incision circumferentially around the calf as well as increased edema in the foot and leg up to the knee level.  She has also had considerable increase in pain.  She does not have any numbness or tingling in her foot.  Of note she had I&D of a perineal abscess as well around February 17.  This is currently receiving local wound care.  Other medical problems include diabetes asthma depression.  These are all currently stable.  She is currently on Eliquis for presumably cardiac source embolus? Pt has no history of afib.  Past Medical History:  Diagnosis Date  . Allergy   . Anxiety   . Asthma   . Depression   . Diabetes mellitus without complication [facility])    Past Surgical History:  Procedure Laterality Date  . APPLICATION OF WOUND VAC Right 10/04/2019   Procedure: Application Of Wound Vac;  Surgeon: Waynetta Sandy, MD;  Location: [place];  Service: Vascular;  Laterality: Right;  . APPLICATION OF WOUND VAC Right 10/06/2019   Procedure: Application Of Wound Vac;  Surgeon: Angelia Mould, MD;  Location: [place];  Service: Vascular;  Laterality: Right;  . CHOLECYSTECTOMY    . FASCIOTOMY CLOSURE Right 10/06/2019   Procedure: FASCIOTOMY CLOSURE Right Lateral Lower leg.;  Surgeon: Angelia Mould, MD;  Location: [place];  Service: Vascular;  Laterality: Right;  . THIGH FASCIOTOMY  Right 10/04/2019   Procedure: Right leg Fasciotomy;  Surgeon: Waynetta Sandy, MD;  Location: [place];  Service: Vascular;  Laterality: Right;  . THROMBECTOMY FEMORAL ARTERY Right 10/04/2019   Procedure: RIGHT LEG THROMBECTOMY;  Surgeon: Waynetta Sandy, MD;  Location: [place];  Service: Vascular;  Laterality: Right;  . TUBAL LIGATION      Family History  Problem Relation Age of Onset  . Stroke Mother   . Heart disease Father     SOCIAL HISTORY: Social History   Socioeconomic History  . Marital status: Married    Spouse name: Not on file  . Number of children: Not on file  . Years of education: Not on file  . Highest education level: Not on file  Occupational History  . Not on file  Tobacco Use  . Smoking status: Current Every Day Smoker    Packs/day: 1.00    Years: 34.00    Pack years: 34.00    Types: Cigarettes  . Smokeless tobacco: Never Used  Substance and Sexual Activity  . Alcohol use: No    Alcohol/week: 0.0 standard drinks  . Drug use: No  . Sexual activity: Not on file  Other Topics Concern  . Not on file  Social History Narrative  . Not on file   Social Determinants of Health   Financial Resource Strain:   . Difficulty of Paying Living Expenses:   Food Insecurity:   .  Worried About Charity fundraiser in the Last Year:   . Arboriculturist in the Last Year:   Transportation Needs:   . Film/video editor (Medical):   Marland Kitchen Lack of Transportation (Non-Medical):   Physical Activity:   . Days of Exercise per Week:   . Minutes of Exercise per Session:   Stress:   . Feeling of Stress :   Social Connections:   . Frequency of Communication with Friends and Family:   . Frequency of Social Gatherings with Friends and Family:   . Attends Religious Services:   . Active Member of Clubs or Organizations:   . Attends Archivist Meetings:   Marland Kitchen Marital Status:   Intimate Partner Violence:   . Fear of Current or Ex-Partner:   .  Emotionally Abused:   Marland Kitchen Physically Abused:   . Sexually Abused:     Allergies  Allergen Reactions  . Codeine   . Guaifenesin Hives    Current Facility-Administered Medications  Medication Dose Route Frequency Provider Last Rate Last Admin  . 0.9 %  sodium chloride infusion  250 mL Intravenous PRN Elam Dutch, MD      . acetaminophen (TYLENOL) tablet 325-650 mg  325-650 mg Oral Q4H PRN Elam Dutch, MD       Or  . acetaminophen (TYLENOL) suppository 325-650 mg  325-650 mg Rectal Q4H PRN Elam Dutch, MD      . albuterol (VENTOLIN HFA) 108 (90 Base) MCG/ACT inhaler 2 puff  2 puff Inhalation Q6H PRN Elam Dutch, MD      . alum & mag hydroxide-simeth (MAALOX/MYLANTA) 200-200-20 MG/5ML suspension 15-30 mL  15-30 mL Oral Q2H PRN Elam Dutch, MD      . atorvastatin (LIPITOR) tablet 10 mg  10 mg Oral Daily Lockie Bothun, Jessy Oto, MD      . beclomethasone (QVAR) 40 MCG/ACT inhaler 1 puff  1 puff Inhalation Daily Breianna Delfino E, MD      . docusate sodium (COLACE) capsule 100 mg  100 mg Oral BID Elam Dutch, MD      . EPINEPHrine AERO 2 puff  2 puff Inhalation Q4H PRN Elam Dutch, MD      . guaiFENesin-dextromethorphan (ROBITUSSIN DM) 100-10 MG/5ML syrup 15 mL  15 mL Oral Q4H PRN Elam Dutch, MD      . hydrALAZINE (APRESOLINE) injection 5 mg  5 mg Intravenous Q20 Min PRN Elam Dutch, MD      . Derrill Memo ON 11/14/2019] insulin aspart (novoLOG) injection 0-20 Units  0-20 Units Subcutaneous TID WC Javel Hersh, Jessy Oto, MD      . insulin isophane & regular human (HUMULIN 70/30 MIX) (70-30) 100 UNIT/ML KwikPen 10 Units  10 Units Subcutaneous BID PC Nhi Butrum E, MD      . labetalol (NORMODYNE) injection 10 mg  10 mg Intravenous Q10 min PRN Elam Dutch, MD      . lisinopril (ZESTRIL) tablet 2.5 mg  2.5 mg Oral Daily Arlen Dupuis, Jessy Oto, MD      . loratadine (CLARITIN) tablet 10 mg  10 mg Oral Daily Elam Dutch, MD      . Derrill Memo ON 11/14/2019]  metFORMIN (GLUCOPHAGE) tablet 1,000 mg  1,000 mg Oral BID WC Sherle Mello E, MD      . metoprolol tartrate (LOPRESSOR) injection 2-5 mg  2-5 mg Intravenous Q2H PRN Elam Dutch, MD      . montelukast (SINGULAIR) tablet 10  mg  10 mg Oral QHS Celica Kotowski, Jessy Oto, MD      . morphine 2 MG/ML injection 2-5 mg  2-5 mg Intravenous Q1H PRN Elam Dutch, MD      . ondansetron [facility]) injection 4 mg  4 mg Intravenous Q6H PRN Elam Dutch, MD      . oxyCODONE-acetaminophen (PERCOCET/ROXICET) 5-325 MG per tablet 1-2 tablet  1-2 tablet Oral Q4H PRN Elam Dutch, MD      . pantoprazole (PROTONIX) EC tablet 40 mg  40 mg Oral Daily Orva Riles, Jessy Oto, MD      . pantoprazole (PROTONIX) EC tablet 40 mg  40 mg Oral Daily Leomar Westberg E, MD      . phenol (CHLORASEPTIC) mouth spray 1 spray  1 spray Mouth/Throat PRN Elam Dutch, MD      . piperacillin-tazobactam (ZOSYN) IVPB 3.375 g  3.375 g Intravenous Q8H Saarah Dewing E, MD      . potassium chloride SA (KLOR-CON) CR tablet 20-40 mEq  20-40 mEq Oral Once Elam Dutch, MD      . sertraline (ZOLOFT) tablet 150 mg  150 mg Oral Daily Rhiley Tarver E, MD      . sodium chloride flush (NS) 0.9 % injection 3 mL  3 mL Intravenous Q12H Lashon Beringer E, MD      . sodium chloride flush (NS) 0.9 % injection 3 mL  3 mL Intravenous PRN Elam Dutch, MD      . vancomycin (VANCOREADY) IVPB 1500 mg/300 mL  1,500 mg Intravenous Q24H Bertis Ruddy, [facility]       Current Outpatient Medications  Medication Sig Dispense Refill  . albuterol (VENTOLIN HFA) 108 (90 Base) MCG/ACT inhaler INHALE TWO PUFFS BY MOUTH 4 TIMES DAILY AS NEEDED.  Pt prefers ventolin brand (Patient taking differently: Inhale 2 puffs into the lungs every 6 (six) hours as needed for wheezing or shortness of breath. ) 18 g 0  . apixaban (ELIQUIS) 5 MG TABS tablet Take 10 mg (Two 5 mg tab) twice daily for 1 week and then 5 mg po BID from then on starting 10/17/19 (Patient taking  differently: Take 5 mg by mouth 2 (two) times daily. ) 70 tablet 0  . atorvastatin (LIPITOR) 10 MG tablet Take 1 tablet (10 mg total) by mouth daily. 90 tablet 3  . EPINEPHrine (PRIMATENE MIST) 0.125 MG/ACT AERO Inhale 2 puffs into the lungs every 4 (four) hours as needed (BREATHING ISSUES).    . Insulin Isophane & Regular Human (NOVOLIN 70/30 FLEXPEN RELION) (70-30) 100 UNIT/ML PEN Inject 10 Units into the skin in the morning and at bedtime. 15 mL 11  . lisinopril (ZESTRIL) 2.5 MG tablet Take 1 tablet (2.5 mg total) by mouth daily. 90 tablet 3  . loratadine (CLARITIN) 10 MG tablet Take 10 mg by mouth daily.    . metFORMIN (GLUCOPHAGE) 500 MG tablet Take 2 tablets (1,000 mg total) by mouth 2 (two) times daily with a meal. 180 tablet 3  . montelukast (SINGULAIR) 10 MG tablet Take 1 tablet (10 mg total) by mouth at bedtime. 90 tablet 3  . oxyCODONE-acetaminophen (PERCOCET) 10-325 MG tablet Take 1 tablet by mouth every 6 (six) hours as needed for pain (take one tab 30 minutes before dressing change). 10 tablet 0  . pantoprazole (PROTONIX) 40 MG tablet Take 1 tablet (40 mg total) by mouth daily. 30 tablet 0  . QVAR REDIHALER 40 MCG/ACT inhaler Inhale 1 puff into the lungs daily.     Marland Kitchen  sertraline (ZOLOFT) 50 MG tablet Take 3 tablets (150 mg total) by mouth daily. 30 tablet 0  . beclomethasone (QVAR) 40 MCG/ACT inhaler Inhale 1 puff into the lungs 2 (two) times daily. (Patient not taking: Reported on 11/13/2019) 1 Inhaler 6  . blood glucose meter kit and supplies KIT Dispense based on patient and insurance preference. Use up to four times daily as directed. (FOR ICD-9 250.00, 250.01). 1 each 0  . Insulin Pen Needle 32G X 4 MM MISC 10 Units by Does not apply route in the morning and at bedtime. 100 each 0  . oxyCODONE (OXY IR/ROXICODONE) 5 MG immediate release tablet Take 1-2 tablets (5-10 mg total) by mouth every 4 (four) hours as needed for moderate pain. (Patient not taking: Reported on 11/13/2019) 10  tablet 0    ROS:   General:  No weight loss, Fever, chills  HEENT: No recent headaches, no nasal bleeding, no visual changes, no sore throat  Neurologic: No dizziness, blackouts, seizures. No recent symptoms of stroke or mini- stroke. No recent episodes of slurred speech, or temporary blindness.  Cardiac: No recent episodes of chest pain/pressure, no shortness of breath at rest.  No shortness of breath with exertion.  Denies history of atrial fibrillation or irregular heartbeat  Vascular: No history of rest pain in feet.  No history of claudication.  No history of non-healing ulcer, No history of DVT   Pulmonary: No home oxygen, no productive cough, no hemoptysis,  + asthma or wheezing  Musculoskeletal:  '[ ]'  Arthritis, '[ ]'  Low back pain,  '[ ]'  Joint pain  Hematologic:No history of hypercoagulable state.  No history of easy bleeding.  No history of anemia  Gastrointestinal: No hematochezia or melena,  No gastroesophageal reflux, no trouble swallowing  Urinary: '[ ]'  chronic Kidney disease, '[ ]'  on HD - '[ ]'  MWF or '[ ]'  TTHS, '[ ]'  Burning with urination, '[ ]'  Frequent urination, '[ ]'  Difficulty urinating;   Skin: No rashes  Psychological: No history of anxiety,  No history of depression   Physical Examination  Vitals:   11/13/19 1323 11/13/19 1325 11/13/19 1742 11/13/19 1743  BP: 139/77  132/69 132/69  Pulse: 96   84  Resp: 18   18  Temp: 98.7 F (37.1 C)     TempSrc: Oral     SpO2: 94% 97%  95%  Weight: 104.3 kg     Height: '5\' 6"'  (1.676 m)       Body mass index is 37.12 kg/m.  General:  Alert and oriented, no acute distress HEENT: Normal Neck: No JVD Pulmonary: Clear to auscultation bilaterally Cardiac: Regular Rate and Rhythm   Abdomen: Soft, non-tender, non-distended, obese Skin: No rash, circumferential erythema from below the knee down to the ankle right leg, healthy-appearing beefy granulation tissue right leg fasciotomy wound 10 x 5 cm Extremity Pulses:  2+ radial,  brachial, femoral, absent dorsalis pedis, posterior tibial pulses bilaterally, feet pink and warm bilaterally Musculoskeletal: No deformity 2+ edema extending from the right knee all the way into the right foot  Neurologic: Upper and lower extremity motor 5/5 and symmetric  DATA:  CBC    Component Value Date/Time   WBC 8.5 11/13/2019 1345   RBC 4.08 11/13/2019 1345   HGB 12.1 11/13/2019 1345   HCT 38.8 11/13/2019 1345   PLT 113 (L) 11/13/2019 1345   MCV 95.1 11/13/2019 1345   MCV 89.0 04/02/2015 1507   MCH 29.7 11/13/2019 1345   MCHC 31.2 11/13/2019  1345   RDW 14.0 11/13/2019 1345   LYMPHSABS 0.9 11/13/2019 1345   MONOABS 0.4 11/13/2019 1345   EOSABS 0.1 11/13/2019 1345   BASOSABS 0.0 11/13/2019 1345    BMET    Component Value Date/Time   NA 142 11/13/2019 1345   K 3.7 11/13/2019 1345   CL 106 11/13/2019 1345   CO2 28 11/13/2019 1345   GLUCOSE 145 (H) 11/13/2019 1345   BUN 8 11/13/2019 1345   CREATININE 0.62 11/13/2019 1345   CREATININE 0.81 04/02/2015 1452   CALCIUM 8.9 11/13/2019 1345   GFRNONAA >60 11/13/2019 1345   GFRAA >60 11/13/2019 1345     ASSESSMENT: Right leg swelling with new onset cellulitis with open fasciotomy wound right leg.  Status post recent tibial embolectomy.   PLAN: Admit for IV antibiotics.  Unsure where the cellulitis is originated from as this is an open wound and should be at low risk for infection.  She could certainly could have a popliteal space abscess.  We will do an ultrasound to rule out DVT and also an abscess in the right calf.  She may need a CT scan if the ultrasound is nondiagnostic.  We will stop her Eliquis for now and place her on heparin until we can determine exactly what the next course of action will be whether or not she would need an I&D of her right leg.  We will order a Covid test on her in the event she has to go to the operating room  Hemoglobin A1c to assess glucose control.  Otherwise continue home  medications.  PT consult for mobility.  We will do wet-to-dry dressings on her perineal abscess area.  She will switch to hydrogel dressings on the fasciotomy wound and we will discontinue the VAC.   Ruta Hinds, MD [facility] Office: [phone] Pager: [phone]

## 2019-11-13 NOTE — Progress Notes (Signed)
Pharmacy Antibiotic Note  [name] is a 54 y.o. female admitted on 11/13/2019 with cellulitis.  Pharmacy has been consulted for vancomycin dosing.  Plan: Vancomycin 1500 mg IV every 24 hours Goal AUC 400-550. Expected AUC: 434 SCr used: 0.8 Monitor renal function, Cx and clinical progression to narrow Vancomycin levels at steady state  Height: 5\' 6"  (167.6 cm) Weight: 230 lb (104.3 kg) IBW/kg (Calculated) : 59.3  Temp (24hrs), Avg:98.7 F (37.1 C), Min:98.7 F (37.1 C), Max:98.7 F (37.1 C)  Recent Labs  Lab 11/13/19 1345  WBC 8.5  CREATININE 0.62  LATICACIDVEN 1.1    Estimated Creatinine Clearance: 97 mL/min (by C-G formula based on SCr of 0.62 mg/dL).    Allergies  Allergen Reactions  . Codeine   . Guaifenesin Hives    11/15/19, PharmD Clinical Pharmacist ED Pharmacist Phone # [phone] 11/13/2019 6:34 PM

## 2019-11-14 ENCOUNTER — Inpatient Hospital Stay (HOSPITAL_COMMUNITY): Payer: Self-pay

## 2019-11-14 DIAGNOSIS — M7989 Other specified soft tissue disorders: Secondary | ICD-10-CM

## 2019-11-14 DIAGNOSIS — R609 Edema, unspecified: Secondary | ICD-10-CM

## 2019-11-14 LAB — CBC
HCT: 36.3 % (ref 36.0–46.0)
Hemoglobin: 11.1 g/dL — ABNORMAL LOW (ref 12.0–15.0)
MCH: 29.3 pg (ref 26.0–34.0)
MCHC: 30.6 g/dL (ref 30.0–36.0)
MCV: 95.8 fL (ref 80.0–100.0)
Platelets: DECREASED 10*3/uL (ref 150–400)
RBC: 3.79 MIL/uL — ABNORMAL LOW (ref 3.87–5.11)
RDW: 14 % (ref 11.5–15.5)
WBC: 8.2 10*3/uL (ref 4.0–10.5)
nRBC: 0 % (ref 0.0–0.2)

## 2019-11-14 LAB — URINALYSIS, ROUTINE W REFLEX MICROSCOPIC
Bilirubin Urine: NEGATIVE
Glucose, UA: NEGATIVE mg/dL
Hgb urine dipstick: NEGATIVE
Ketones, ur: 20 mg/dL — AB
Leukocytes,Ua: NEGATIVE
Nitrite: NEGATIVE
Protein, ur: NEGATIVE mg/dL
Specific Gravity, Urine: 1.02 (ref 1.005–1.030)
pH: 6 (ref 5.0–8.0)

## 2019-11-14 LAB — GLUCOSE, CAPILLARY
Glucose-Capillary: 125 mg/dL — ABNORMAL HIGH (ref 70–99)
Glucose-Capillary: 115 mg/dL — ABNORMAL HIGH (ref 70–99)
Glucose-Capillary: 143 mg/dL — ABNORMAL HIGH (ref 70–99)
Glucose-Capillary: 129 mg/dL — ABNORMAL HIGH (ref 70–99)

## 2019-11-14 LAB — APTT
aPTT: 50 seconds — ABNORMAL HIGH (ref 24–36)
aPTT: 70 seconds — ABNORMAL HIGH (ref 24–36)

## 2019-11-14 LAB — HEPARIN LEVEL (UNFRACTIONATED): Heparin Unfractionated: 0.29 IU/mL — ABNORMAL LOW (ref 0.30–0.70)

## 2019-11-14 LAB — HEMOGLOBIN A1C
Hgb A1c MFr Bld: 8.6 % — ABNORMAL HIGH (ref 4.8–5.6)
Mean Plasma Glucose: 200.12 mg/dL

## 2019-11-14 MED ORDER — BUDESONIDE 0.25 MG/2ML IN SUSP
0.2500 mg | Freq: Two times a day (BID) | RESPIRATORY_TRACT | Status: DC
  Administered 2019-11-14 – 2019-11-15 (×3): 0.25 mg via RESPIRATORY_TRACT
  Filled 2019-11-14 (×3): qty 2

## 2019-11-14 NOTE — Progress Notes (Signed)
ANTICOAGULATION CONSULT NOTE  Pharmacy Consult for heparin Indication: DVT  Allergies  Allergen Reactions  . Codeine   . Guaifenesin Hives    Patient Measurements: Height: 5\' 6"  (167.6 cm) Weight: 230 lb (104.3 kg) IBW/kg (Calculated) : 59.3 Heparin Dosing Weight: 83.2kg  Vital Signs: Temp: 98.7 F (37.1 C) (03/30 0834) Temp Source: Oral (03/30 0834) BP: 128/67 (03/30 0834) Pulse Rate: 75 (03/30 0834)  Labs: Recent Labs    11/13/19 1345 11/14/19 0238 11/14/19 1023  HGB 12.1 11.1*  --   HCT 38.8 36.3  --   PLT 113* PLATELET CLUMPS NOTED ON SMEAR, COUNT APPEARS DECREASED  --   APTT  --  50* 70*  HEPARINUNFRC  --  0.29*  --   CREATININE 0.62  --   --     Estimated Creatinine Clearance: 97 mL/min (by C-G formula based on SCr of 0.62 mg/dL).   Medical History: Past Medical History:  Diagnosis Date  . Allergy   . Anxiety   . Asthma   . Depression   . Diabetes mellitus without complication (HCC)    Assessment: 89 YOF presenting with RLE redness s/p thrombectomy + fasciotomy.  On Eliquis PTA for DVT/ischemic leg with last dose 3/28 @2100 .  Eliquis was started on 2/23/2 and on hold for possible procedure -APTT= 70 and at goal -hg= 11.1  Goal of Therapy:  Heparin level 0.3-0.7 units/ml aPTT 66-102 seconds Monitor platelets by anticoagulation protocol: Yes   Plan:  -Continue heparin at 1550 units/hr -Daily heparin level and CBC  , PharmD Clinical Pharmacist **Pharmacist phone directory can now be found on [email or web address] ([email or web address]).  Listed under [facility].

## 2019-11-14 NOTE — Progress Notes (Signed)
Lower extremity venous has been completed.   Preliminary results in CV Proc.   Blanch Media 11/14/2019 9:24 AM

## 2019-11-14 NOTE — Progress Notes (Signed)
ANTICOAGULATION CONSULT NOTE  Pharmacy Consult for heparin Indication: DVT  Allergies  Allergen Reactions  . Codeine   . Guaifenesin Hives    Patient Measurements: Height: 5\' 6"  (167.6 cm) Weight: 230 lb (104.3 kg) IBW/kg (Calculated) : 59.3 Heparin Dosing Weight: 83.2kg  Vital Signs: Temp: 98.3 F (36.8 C) (03/30 0231) Temp Source: Oral (03/30 0231) BP: 109/54 (03/30 0231) Pulse Rate: 73 (03/30 0231)  Labs: Recent Labs    11/13/19 1345 11/14/19 0238  HGB 12.1 11.1*  HCT 38.8 36.3  PLT 113* PLATELET CLUMPS NOTED ON SMEAR, COUNT APPEARS DECREASED  APTT  --  50*  HEPARINUNFRC  --  0.29*  CREATININE 0.62  --     Estimated Creatinine Clearance: 97 mL/min (by C-G formula based on SCr of 0.62 mg/dL).   Medical History: Past Medical History:  Diagnosis Date  . Allergy   . Anxiety   . Asthma   . Depression   . Diabetes mellitus without complication (HCC)    Assessment: 70 YOF presenting with RLE redness s/p thrombectomy + fasciotomy.  On Eliquis PTA for DVT/ischemic leg with last dose 3/28 @2100 .  H/H wnl, plts 113 (chronic).   Initial PTT 50 seconds   Goal of Therapy:  Heparin level 0.3-0.7 units/ml aPTT 66-102 seconds Monitor platelets by anticoagulation protocol: Yes   Plan:  Heparin drip to 1550 units / hr F/u 6 hour aPTT  Thank you 4/28, PharmD 11/14/2019 4:00 AM

## 2019-11-14 NOTE — Plan of Care (Signed)
POC initiated and progressing. 

## 2019-11-14 NOTE — Evaluation (Signed)
Physical Therapy Evaluation Patient Details Name: [name] MRN: [id number] DOB: May 26, 1966 Today's Date: 11/14/2019   History of Present Illness  Patient is a 54 y/o female who presents with RLE cellulitis. Pt with recent RLE popliteal and tibial embolectomy 09/2019 and lateral fasciotomy. PMH includes DVT, DM, asthma, perineal abscess, depression,  Clinical Impression  Patient presents with swelling, redness and warmth of RLE, dyspnea on exertion, decreased activity tolerance and impaired mobility s/p above. Pt lives with son (who works) and reports being Mod I for ADLs and walking PTA. Pt reports she also has assist from her sister if her son is not there. Today, pt tolerated bed mobility, transfers and gait training with Min guard-supervision for safety and use of RW for support. HR up to 159 bpm during activity, resolved quickly with rest break. Encouraged walking with nursing 3 times daily. Will follow acutely to maximize independence and mobility prior to return home.     Follow Up Recommendations No PT follow up;Supervision - Intermittent    Equipment Recommendations  None recommended by PT    Recommendations for Other Services       Precautions / Restrictions Precautions Precautions: Fall Restrictions Weight Bearing Restrictions: No      Mobility  Bed Mobility Overal bed mobility: Needs Assistance Bed Mobility: Supine to Sit     Supine to sit: Supervision;HOB elevated     General bed mobility comments: Supervision for safety; use of rail.  Transfers Overall transfer level: Needs assistance Equipment used: Rolling walker (2 wheeled) Transfers: Sit to/from [employer] Sit to Stand: Min guard Stand pivot transfers: Supervision       General transfer comment: Min guard for safety, Stood from EOB x1, from [facility] x1. SPT BSC to bed with supervision for lines.  Ambulation/Gait Ambulation/Gait assistance: Min guard Gait Distance (Feet): 240  Feet Assistive device: Rolling walker (2 wheeled) Gait Pattern/deviations: Step-through pattern;Decreased stride length;Decreased stance time - right;Trunk flexed Gait velocity: decreased   General Gait Details: Slow, mildly unsteady gait with decreased heel strike RLE upon initial contact- improved with distance and cues. HR up to 159 bpm, recovered to low 100s with rest.  Stairs            Wheelchair Mobility    Modified Rankin (Stroke Patients Only)       Balance Overall balance assessment: Needs assistance Sitting-balance support: Feet supported;No upper extremity supported Sitting balance-Leahy Scale: Good     Standing balance support: During functional activity Standing balance-Leahy Scale: Fair Standing balance comment: Does better with RW for dynamic balance/walking.                             Pertinent Vitals/Pain Pain Assessment: No/denies pain    Home Living Family/patient expects to be discharged to:: Private residence Living Arrangements: (son works, sister can also help) Available Help at Discharge: Family;Available PRN/intermittently Type of Home: House Home Access: Level entry;Ramped entrance     Home Layout: One level Home Equipment: Cane - single point;Grab bars - tub/shower;Walker - 2 wheels      Prior Function Level of Independence: Independent         Comments: Does own ADLs. Does some cooking. Not driving.     Hand Dominance        Extremity/Trunk Assessment   Upper Extremity Assessment Upper Extremity Assessment: Defer to OT evaluation    Lower Extremity Assessment Lower Extremity Assessment: RLE deficits/detail RLE Deficits / Details:  Swelling, erythema and warmth present distal LE RLE Sensation: decreased light touch       Communication   Communication: No difficulties  Cognition Arousal/Alertness: Awake/alert Behavior During Therapy: WFL for tasks assessed/performed Overall Cognitive Status: Within  Functional Limits for tasks assessed                                 General Comments: Jokes a lot and laughs appropriately.      General Comments General comments (skin integrity, edema, etc.): RLE with warmth, swelling and erythema.    Exercises     Assessment/Plan    PT Assessment Patient needs continued PT services  PT Problem List Decreased mobility;Decreased range of motion;Decreased skin integrity;Cardiopulmonary status limiting activity;Decreased activity tolerance;Decreased balance;Impaired sensation       PT Treatment Interventions Therapeutic activities;Gait training;Therapeutic exercise;Patient/family education;Balance training;Functional mobility training    PT Goals (Current goals can be found in the Care Plan section)  Acute Rehab PT Goals Patient Stated Goal: to get better PT Goal Formulation: With patient Time For Goal Achievement: 11/28/19 Potential to Achieve Goals: Good    Frequency Min 3X/week   Barriers to discharge        Co-evaluation               AM-PAC PT "6 Clicks" Mobility  Outcome Measure Help needed turning from your back to your side while in a flat bed without using bedrails?: None Help needed moving from lying on your back to sitting on the side of a flat bed without using bedrails?: A Little Help needed moving to and from a bed to a chair (including a wheelchair)?: A Little Help needed standing up from a chair using your arms (e.g., wheelchair or bedside chair)?: A Little Help needed to walk in hospital room?: A Little Help needed climbing 3-5 steps with a railing? : A Little 6 Click Score: 19    End of Session Equipment Utilized During Treatment: Gait belt Activity Tolerance: Patient tolerated treatment well Patient left: in bed;with call bell/phone within reach;with bed alarm set Nurse Communication: Mobility status PT Visit Diagnosis: Difficulty in walking, not elsewhere classified (R26.2)    Time:  2119-4174 PT Time Calculation (min) (ACUTE ONLY): 23 min   Charges:   PT Evaluation $PT Eval Moderate Complexity: 1 Mod PT Treatments $Gait Training: 8-22 mins        Marisa Severin, PT, DPT Acute Rehabilitation Services Pager [phone] Office [phone]      Marguarite Arbour A Sabra Heck 11/14/2019, 12:13 PM

## 2019-11-14 NOTE — Progress Notes (Signed)
Dressing change with hydrogel completed per MD order without difficulty.  Patient tolerated dressing change well.  Will continue to monitor.

## 2019-11-14 NOTE — Progress Notes (Addendum)
   VASCULAR SURGERY ASSESSMENT & PLAN:  CELLULITIS right lower extremity. On Zosyn and vanc. Afebrile. Normal WBC. Duplex to r/o DVT pending. Also to assess for pop space abscess.Hydrogel dressing to lower leg wound  Perineal abscess: Essentially healed. No tract or drainage SUBJECTIVE:   Complaining of right lower leg pain. No N, V, fever  PHYSICAL EXAM:   Vitals:   11/14/19 0115 11/14/19 0130 11/14/19 0145 11/14/19 0231  BP: 119/67 122/70 121/69 (!) 109/54  Pulse: 74 74 72 73  Resp: 20 19 17  (!) 21  Temp:    98.3 F (36.8 C)  TempSrc:    Oral  SpO2: 97% 96% 98% 94%  Weight:      Height:       General: WDWN, NAD Resp: nonlabored Extremities: (R): Moderate edema of lower leg. Peri-wound erythema. Granulation in superior wound bed with some adherent slough of inferior aspect of wound. Foot war. AROM (L): No edema or skin changes      LABS:   Lab Results  Component Value Date   WBC 8.2 11/14/2019   HGB 11.1 (L) 11/14/2019   HCT 36.3 11/14/2019   MCV 95.8 11/14/2019   PLT  11/14/2019    PLATELET CLUMPS NOTED ON SMEAR, COUNT APPEARS DECREASED   Lab Results  Component Value Date   CREATININE 0.62 11/13/2019   Lab Results  Component Value Date   INR 1.5 (H) 10/04/2019   CBG (last 3)  Recent Labs    11/13/19 2236  GLUCAP 153*   Updated COVID negative PROBLEM LIST:    Active Problems:   Cellulitis of right leg   CURRENT MEDS:   . atorvastatin  10 mg Oral Daily  . budesonide (PULMICORT) nebulizer solution  0.25 mg Nebulization BID  . docusate sodium  100 mg Oral BID  . insulin aspart  0-20 Units Subcutaneous TID WC  . insulin aspart protamine- aspart  10 Units Subcutaneous BID WC  . lisinopril  2.5 mg Oral Daily  . loratadine  10 mg Oral Daily  . metFORMIN  1,000 mg Oral BID WC  . montelukast  10 mg Oral QHS  . pantoprazole  40 mg Oral Daily  . sertraline  150 mg Oral Daily  . sodium chloride flush  3 mL Intravenous Q12H    2237,  Wendi Maya Office: [phone] 11/14/2019   Agree with above.  Patient states her right leg feels better.  Ultrasound shows no evidence of DVT or abscess.  Pain control wound care IV antibiotics for now.  If clinical condition worsens would get a CT scan of the right leg.  11/16/2019, MD [facility] [place] Office: [phone]

## 2019-11-14 NOTE — Progress Notes (Signed)
MOBILITY TEAM - Progress Note   11/14/19 1332  Mobility  Activity Ambulated in hall  Level of Assistance Standby assist, set-up cues, supervision of patient - no hands on  Assistive Device Front wheel walker  Distance Ambulated (ft) 340 ft  Mobility Response Tolerated well  Mobility performed by Mobility specialist  Bed Position High-fowlers   HR 87-125. Patient requesting rolling walker for ambulation secondary to RLE pain. Tolerated walk well.  Ina Homes, PT, DPT Mobility Team Pager [phone]

## 2019-11-15 LAB — CBC
HCT: 34.1 % — ABNORMAL LOW (ref 36.0–46.0)
Hemoglobin: 10.5 g/dL — ABNORMAL LOW (ref 12.0–15.0)
MCH: 29.5 pg (ref 26.0–34.0)
MCHC: 30.8 g/dL (ref 30.0–36.0)
MCV: 95.8 fL (ref 80.0–100.0)
Platelets: DECREASED 10*3/uL (ref 150–400)
RBC: 3.56 MIL/uL — ABNORMAL LOW (ref 3.87–5.11)
RDW: 14.1 % (ref 11.5–15.5)
WBC: 6 10*3/uL (ref 4.0–10.5)
nRBC: 0 % (ref 0.0–0.2)

## 2019-11-15 LAB — GLUCOSE, CAPILLARY: Glucose-Capillary: 117 mg/dL — ABNORMAL HIGH (ref 70–99)

## 2019-11-15 MED ORDER — LEVOFLOXACIN 500 MG PO TABS: 500.0000 mg | ORAL_TABLET | Freq: Every day | ORAL | 0 refills | Status: DC

## 2019-11-15 MED ORDER — LEVOFLOXACIN 500 MG PO TABS: 500.0000 mg | ORAL_TABLET | Freq: Every day | ORAL | 0 refills | Status: AC

## 2019-11-15 MED ORDER — APIXABAN 5 MG PO TABS: 5.0000 mg | ORAL_TABLET | Freq: Two times a day (BID) | ORAL | Status: AC

## 2019-11-15 MED ORDER — APIXABAN 5 MG PO TABS
5.0000 mg | ORAL_TABLET | Freq: Two times a day (BID) | ORAL | Status: DC
  Administered 2019-11-15: 5 mg via ORAL
  Filled 2019-11-15: qty 1

## 2019-11-15 MED ORDER — OXYCODONE-ACETAMINOPHEN 7.5-325 MG PO TABS: 1.0000 | ORAL_TABLET | Freq: Four times a day (QID) | ORAL | 0 refills | Status: AC | PRN

## 2019-11-15 NOTE — Progress Notes (Addendum)
Progress Note    11/15/2019 7:37 AM * No surgery found *  Subjective: Feels better.  Tolerating diet.  No nausea vomiting or shortness of breath  Vitals:   11/14/19 1935 11/15/19 0415  BP: 111/63 112/63  Pulse:  70  Resp: 19 20  Temp: 98.7 F (37.1 C) 98.7 F (37.1 C)  SpO2: 95% 94%    Physical Exam: Cardiac: Rate and rhythm are regular Lungs: Labored Extremities: Right lower extremity examined.  Moderate edema.  Erythema much improved.  Wound bed with approximately 80% granulation and slough improving Abdomen: Soft nondistended RIGHT:  - There is no evidence of deep vein thrombosis in the lower extremity.    - No cystic structure found in the popliteal fossa.    LEFT:  - No evidence of common femoral vein obstruction.    CBC    Component Value Date/Time   WBC 6.0 11/15/2019 0310   RBC 3.56 (L) 11/15/2019 0310   HGB 10.5 (L) 11/15/2019 0310   HCT 34.1 (L) 11/15/2019 0310   PLT  11/15/2019 0310    PLATELET CLUMPS NOTED ON SMEAR, COUNT APPEARS DECREASED   MCV 95.8 11/15/2019 0310   MCV 89.0 04/02/2015 1507   MCH 29.5 11/15/2019 0310   MCHC 30.8 11/15/2019 0310   RDW 14.1 11/15/2019 0310   LYMPHSABS 0.9 11/13/2019 1345   MONOABS 0.4 11/13/2019 1345   EOSABS 0.1 11/13/2019 1345   BASOSABS 0.0 11/13/2019 1345    BMET    Component Value Date/Time   NA 142 11/13/2019 1345   K 3.7 11/13/2019 1345   CL 106 11/13/2019 1345   CO2 28 11/13/2019 1345   GLUCOSE 145 (H) 11/13/2019 1345   BUN 8 11/13/2019 1345   CREATININE 0.62 11/13/2019 1345   CREATININE 0.81 04/02/2015 1452   CALCIUM 8.9 11/13/2019 1345   GFRNONAA >60 11/13/2019 1345   GFRAA >60 11/13/2019 1345     Intake/Output Summary (Last 24 hours) at 11/15/2019 0737 Last data filed at 11/15/2019 0300 Gross per 24 hour  Intake 1130.63 ml  Output --  Net 1130.63 ml    HOSPITAL MEDICATIONS Scheduled Meds: . atorvastatin  10 mg Oral Daily  . budesonide (PULMICORT) nebulizer solution  0.25 mg  Nebulization BID  . docusate sodium  100 mg Oral BID  . insulin aspart  0-20 Units Subcutaneous TID WC  . insulin aspart protamine- aspart  10 Units Subcutaneous BID WC  . lisinopril  2.5 mg Oral Daily  . loratadine  10 mg Oral Daily  . metFORMIN  1,000 mg Oral BID WC  . montelukast  10 mg Oral QHS  . pantoprazole  40 mg Oral Daily  . sertraline  150 mg Oral Daily  . sodium chloride flush  3 mL Intravenous Q12H   Continuous Infusions: . sodium chloride    . heparin 1,550 Units/hr (11/15/19 0300)  . piperacillin-tazobactam (ZOSYN)  IV 3.375 g (11/15/19 7412)  . vancomycin Stopped (11/14/19 2137)   PRN Meds:.sodium chloride, acetaminophen **OR** acetaminophen, albuterol, alum & mag hydroxide-simeth, guaiFENesin-dextromethorphan, hydrALAZINE, labetalol, metoprolol tartrate, morphine injection, ondansetron, oxyCODONE-acetaminophen, phenol, sodium chloride flush  Assessment:  54 y.o. female is s/p: Right lower extremity DVT with fasciotomies.  Presented with right lower extremity cellulitis.  She is improving on Zosyn and vancomycin.  She has remained afebrile.  Today, her wound is cleaner and her erythema is nearly resolved.     Plan: -We will plan discharge home with continued local wound care with hydrogel and McQuinn for 14 days -  DVT prophylaxis:  Heparin infusion   Wendi Maya, PA-C [facility] [phone] 11/15/2019  7:37 AM   Agree with above D/c home vas Hydrogel daily to fasciotomy Levaquin for 14 days Follow up Randie Heinz 2 weeks  Fabienne Bruns, MD [facility] [place] Office: [phone]

## 2019-11-15 NOTE — Discharge Instructions (Signed)

## 2019-11-15 NOTE — Progress Notes (Signed)
Discharge: Discharge home today at 1140 am.  D/c summary reviewed with patient.  Medications reviewed.  CCMD notified.  IV access discontinued.   Wound change supplies given x 3 day per CM

## 2019-11-15 NOTE — Plan of Care (Signed)
  Problem: Education: Goal: Knowledge of General Education information will improve Description: Including pain rating scale, medication(s)/side effects and non-pharmacologic comfort measures Outcome: Adequate for Discharge   Problem: Clinical Measurements: Goal: Ability to maintain clinical measurements within normal limits will improve Outcome: Adequate for Discharge Goal: Will remain free from infection Outcome: Adequate for Discharge Goal: Diagnostic test results will improve Outcome: Adequate for Discharge Goal: Respiratory complications will improve Outcome: Adequate for Discharge Goal: Cardiovascular complication will be avoided Outcome: Adequate for Discharge   Problem: Activity: Goal: Risk for activity intolerance will decrease Outcome: Adequate for Discharge   Problem: Acute Rehab PT Goals(only PT should resolve) Goal: Pt Will Go Supine/Side To Sit Outcome: Adequate for Discharge Goal: Pt Will Go Sit To Supine/Side Outcome: Adequate for Discharge Goal: Patient Will Transfer Sit To/From Stand Outcome: Adequate for Discharge Goal: Pt Will Ambulate Outcome: Adequate for Discharge

## 2019-11-15 NOTE — TOC Transition Note (Signed)
Transition of Care [facility]) - CM/SW Discharge Note Donn Pierini RN, BSN Transitions of Care Unit 4E- RN Case Manager [phone]   Patient Details  Name: [name] MRN: [id number] Date of Birth: 10/22/1965  Transition of Care [facility]) CM/SW Contact:  Darrold Span, RN Phone Number: 11/15/2019, 11:53 AM   Clinical Narrative:    Pt stable for transition home today, order placed for [facility] needed for wound care- pt does not have wound VAC on at this time. Pt is active with [facility] services under LOG from last admission. Call made to [facility] and spoke with Boneta Lucks to confirm pt still active with them and that they can continue to service pt for needed home care and wound care.- [facility] can continue services- new HH orders faxed to [facility] via epic to fax # [phone]. Pt's sister to transport home.    Final next level of care: Home w Home Health Services Barriers to Discharge: No Barriers Identified   Patient Goals and CMS Choice  pt to resume [facility] services       Discharge Placement             Home with [facility]          Discharge Plan and Services   Discharge Planning Services: CM Consult Post Acute Care Choice: Resumption of Svcs/PTA Provider, Home Health                    HH Arranged: RN [facility] Agency: Other - See comment([facility]) Date HH Agency Contacted: 11/15/19 Time HH Agency Contacted: 1000 Representative spoke with at [facility] Agency: Boneta Lucks  Social Determinants of Health (SDOH) Interventions     Readmission Risk Interventions Readmission Risk Prevention Plan 11/15/2019  Transportation Screening Complete  PCP or Specialist Appt within 5-7 Days Complete  Home Care Screening Complete  Medication Review (RN CM) Complete  Some recent data might be hidden

## 2019-11-16 NOTE — Discharge Summary (Signed)
Discharge Summary  Patient ID: Dayna Alia Eye [id number] 54 y.o. April 24, 1966  Admit date: 11/13/2019  Discharge date and time: 11/15/2019 11:43 AM   Admitting Physician: Elam Dutch, MD   Discharge [place], MD   Admission Diagnoses: Cellulitis of right leg [P61.950] Cellulitis of right lower extremity [L03.115]  Discharge Diagnoses: Cellulitis of right leg [D32.671] Cellulitis of right lower extremity [L03.115]  Admission Condition: good  Discharged Condition: good  Indication for Admission: Pain, swelling and erythema of RLE c/w cellullitits  Hospital Course: The patient is a 54 year old female recently treated for acute ischemia of the right lower extremity  requiring right lower extremity thromboembolectomy and 4 compartment fasciotomies by Dr. Donzetta Matters.  sHe did well and was discharged home on apixaban.  Prior to discharge, her lateral fasciotomy site was primarily closed and she was discharged with wound VAC dressing to the medial fasciotomy site.  She was followed as an outpatient and she maintained good perfusion to her right lower extremity anteromedial wound was granulating. Prior to admission, she noticed increasing erythema and pain in the right lower leg.  She was evaluated in the office, admitted and placed on broad-spectrum antibiotics.  The VAC dressing was removed and wet-to-dry dressings initiated.  Duplex ultrasound of the lower extremity did not reveal DVT or abscess of the popliteal space. She remained afebrile with stable vital signs.  By hospital day 2, her erythema had improved as had her pain.  Hydrogel dressing was used and her wound was cleaning up.  She was ready for discharge home in satisfactory condition on oral Levaquin for 14 days  Consults: None   Treatments: antibiotics: Zosyn, vancomycin  Discharge Exam:   Vitals:   11/15/19 0415 11/15/19 0737  BP: 112/63   Pulse: 70   Resp: 20   Temp: 98.7 F (37.1 C)   SpO2: 94% 98%    Cardiac: Rate and rhythm are regular Lungs: Clear to auscultation bilaterally Incisions: Right lower leg lateral incision is well-healed Extremities: Right lower leg medial wound is granulating.  Erythema resolved.  Edema persists but improved.  2+ dorsalis pedis pulse Abdomen: Soft and nondistended Neurologic: Alert and oriented x4.   Disposition: Discharge disposition: 01-Home or Self Care       Patient Instructions:  Allergies as of 11/15/2019      Reactions   Codeine    Guaifenesin Hives      Medication List    STOP taking these medications   oxyCODONE 5 MG immediate release tablet Commonly known as: Oxy IR/ROXICODONE     TAKE these medications   albuterol 108 (90 Base) MCG/ACT inhaler Commonly known as: Ventolin HFA INHALE TWO PUFFS BY MOUTH 4 TIMES DAILY AS NEEDED.  Pt prefers ventolin brand What changed:   how much to take  how to take this  when to take this  reasons to take this  additional instructions   apixaban 5 MG Tabs tablet Commonly known as: ELIQUIS Take 1 tablet (5 mg total) by mouth 2 (two) times daily.   atorvastatin 10 MG tablet Commonly known as: LIPITOR Take 1 tablet (10 mg total) by mouth daily.   blood glucose meter kit and supplies Kit Dispense based on patient and insurance preference. Use up to four times daily as directed. (FOR ICD-9 250.00, 250.01).   Insulin Pen Needle 32G X 4 MM Misc 10 Units by Does not apply route in the morning and at bedtime.   levofloxacin 500 MG tablet Commonly known as: LEVAQUIN Take  1 tablet (500 mg total) by mouth daily for 14 days.   lisinopril 2.5 MG tablet Commonly known as: ZESTRIL Take 1 tablet (2.5 mg total) by mouth daily.   loratadine 10 MG tablet Commonly known as: CLARITIN Take 10 mg by mouth daily.   metFORMIN 500 MG tablet Commonly known as: GLUCOPHAGE Take 2 tablets (1,000 mg total) by mouth 2 (two) times daily with a meal.   montelukast 10 MG tablet Commonly known as:  SINGULAIR Take 1 tablet (10 mg total) by mouth at bedtime.   NovoLIN 70/30 FlexPen Relion (70-30) 100 UNIT/ML KwikPen Generic drug: insulin isophane & regular human Inject 10 Units into the skin in the morning and at bedtime.   oxyCODONE-acetaminophen 10-325 MG tablet Commonly known as: Percocet Take 1 tablet by mouth every 6 (six) hours as needed for pain (take one tab 30 minutes before dressing change). What changed: Another medication with the same name was added. Make sure you understand how and when to take each.   oxyCODONE-acetaminophen 7.5-325 MG tablet Commonly known as: Percocet Take 1 tablet by mouth every 6 (six) hours as needed for severe pain. What changed: You were already taking a medication with the same name, and this prescription was added. Make sure you understand how and when to take each.   pantoprazole 40 MG tablet Commonly known as: PROTONIX Take 1 tablet (40 mg total) by mouth daily.   Primatene Mist 0.125 MG/ACT Aero Generic drug: EPINEPHrine Inhale 2 puffs into the lungs every 4 (four) hours as needed (BREATHING ISSUES).   Qvar 40 MCG/ACT inhaler Generic drug: beclomethasone Inhale 1 puff into the lungs 2 (two) times daily.   Qvar RediHaler 40 MCG/ACT inhaler Generic drug: beclomethasone Inhale 1 puff into the lungs daily.   sertraline 50 MG tablet Commonly known as: ZOLOFT Take 3 tablets (150 mg total) by mouth daily.      Activity: activity as tolerated Diet: diabetic diet Wound Care: as directed.  Home health will be restarted with hydrogel applied to right lower extremity wound changed daily.  Follow-up with Dr. Donzetta Matters in 2 weeks.  Signed: Barbie Banner, PA-C 11/16/2019 8:57 AM [facility] Office: [phone]

## 2019-11-20 ENCOUNTER — Ambulatory Visit: Payer: Self-pay

## 2019-11-20 LAB — CULTURE, BLOOD (ROUTINE X 2)
Culture: NO GROWTH
Culture: NO GROWTH
Special Requests: ADEQUATE

## 2019-11-27 ENCOUNTER — Telehealth: Payer: Self-pay | Admitting: *Deleted

## 2019-11-27 NOTE — Telephone Encounter (Signed)
Patient called requesting additional pain medication.  I discussed with Marisue Humble,  PA.  I explained to patient that she would need an office visit if she was in that much pain.   She declined the office visit and stated that she would just keep her appointment with Dr Randie Heinz Friday, 12/01/2019.

## 2019-11-30 ENCOUNTER — Telehealth (HOSPITAL_COMMUNITY): Payer: Self-pay

## 2019-11-30 NOTE — Telephone Encounter (Signed)

## 2019-12-01 ENCOUNTER — Encounter: Payer: Self-pay | Admitting: Vascular Surgery

## 2019-12-01 ENCOUNTER — Other Ambulatory Visit: Payer: Self-pay

## 2019-12-01 ENCOUNTER — Ambulatory Visit (INDEPENDENT_AMBULATORY_CARE_PROVIDER_SITE_OTHER): Payer: Self-pay | Admitting: Vascular Surgery

## 2019-12-01 VITALS — BP 149/92 | HR 84 | Temp 97.8°F | Resp 20 | Ht 66.0 in | Wt 230.0 lb

## 2019-12-01 DIAGNOSIS — I998 Other disorder of circulatory system: Secondary | ICD-10-CM

## 2019-12-01 MED ORDER — OXYCODONE-ACETAMINOPHEN 5-325 MG PO TABS: 1.0000 | ORAL_TABLET | ORAL | 0 refills | Status: AC | PRN

## 2019-12-01 NOTE — Progress Notes (Signed)
    Subjective:     Patient ID: [name], female   DOB: 08/13/1966, 54 y.o.   MRN: [id number]  HPI 54 year old female was recently admitted with sepsis found to have acute right lower extremity ischemia.  She has undergone embolectomy.  She continues on Eliquis.  She was readmitted for cellulitis.  This has resolved she continues on Levaquin for this.  Wound VAC was discontinued she is now on wet-to-dry dressings.  She does have significant swelling in the right lower extremity.  She also has issues with nerves describing lightening-like feelings down her leg.   Review of Systems Right lower extremity pain and swelling    Objective:   Physical Exam Vitals:   12/01/19 0829  BP: (!) 149/92  Pulse: 84  Resp: 20  Temp: 97.8 F (36.6 C)  SpO2: 94%   Awake alert oriented Right lower extremity with stable nonpitting edema Right groin incision well-healed Right medial fasciotomy incision is clean with granulation tissue wet-to-dry dressing replaced.   Palpable dorsalis pedis pulse on the right    Assessment/plan     54 year old female status post above-noted procedure.  She continues on Eliquis.  There is not a discernible source for embolus patient was septic at the time.  We will continue Eliquis for the time being.  She will follow-up in 2 months with right lower extremity duplex and ABIs.  I have given her her last pain medicine prescription from our office today.      Megumi Treaster C. Randie Heinz, MD [facility] [place] Office: [phone] Pager: [phone]

## 2019-12-04 ENCOUNTER — Other Ambulatory Visit: Payer: Self-pay | Admitting: *Deleted

## 2019-12-04 DIAGNOSIS — I998 Other disorder of circulatory system: Secondary | ICD-10-CM

## 2020-01-17 ENCOUNTER — Emergency Department (HOSPITAL_COMMUNITY)
Admission: EM | Admit: 2020-01-17 | Discharge: 2020-01-17 | Disposition: A | Discharge status: Home / Self Care | Payer: Self-pay | Attending: Emergency Medicine | Admitting: Emergency Medicine

## 2020-01-17 ENCOUNTER — Encounter (HOSPITAL_COMMUNITY): Payer: Self-pay | Admitting: Emergency Medicine

## 2020-01-17 ENCOUNTER — Other Ambulatory Visit: Payer: Self-pay

## 2020-01-17 DIAGNOSIS — L739 Follicular disorder, unspecified: Secondary | ICD-10-CM | POA: Insufficient documentation

## 2020-01-17 DIAGNOSIS — Z794 Long term (current) use of insulin: Secondary | ICD-10-CM | POA: Insufficient documentation

## 2020-01-17 DIAGNOSIS — J45909 Unspecified asthma, uncomplicated: Secondary | ICD-10-CM | POA: Insufficient documentation

## 2020-01-17 DIAGNOSIS — E119 Type 2 diabetes mellitus without complications: Secondary | ICD-10-CM | POA: Insufficient documentation

## 2020-01-17 DIAGNOSIS — Z79899 Other long term (current) drug therapy: Secondary | ICD-10-CM | POA: Insufficient documentation

## 2020-01-17 DIAGNOSIS — Z7901 Long term (current) use of anticoagulants: Secondary | ICD-10-CM | POA: Insufficient documentation

## 2020-01-17 LAB — I-STAT BETA HCG BLOOD, ED (MC, WL, AP ONLY): I-stat hCG, quantitative: <5 m[IU]/mL (ref <5)

## 2020-01-17 LAB — CBC WITH DIFFERENTIAL/PLATELET
Abs Immature Granulocytes: 0.02 10*3/uL (ref 0.00–0.07)
Basophils Absolute: 0 10*3/uL (ref 0.0–0.1)
Basophils Relative: 0 %
Eosinophils Absolute: 0.2 10*3/uL (ref 0.0–0.5)
Eosinophils Relative: 2 %
HCT: 41.6 % (ref 36.0–46.0)
Hemoglobin: 13.1 g/dL (ref 12.0–15.0)
Immature Granulocytes: 0 %
Lymphocytes Relative: 24 %
Lymphs Abs: 1.8 10*3/uL (ref 0.7–4.0)
MCH: 28.5 pg (ref 26.0–34.0)
MCHC: 31.5 g/dL (ref 30.0–36.0)
MCV: 90.4 fL (ref 80.0–100.0)
Monocytes Absolute: 0.3 10*3/uL (ref 0.1–1.0)
Monocytes Relative: 4 %
Neutro Abs: 5.2 10*3/uL (ref 1.7–7.7)
Neutrophils Relative %: 70 %
RBC: 4.6 MIL/uL (ref 3.87–5.11)
RDW: 13.4 % (ref 11.5–15.5)
WBC: 7.5 10*3/uL (ref 4.0–10.5)
nRBC: 0 % (ref 0.0–0.2)

## 2020-01-17 LAB — COMPREHENSIVE METABOLIC PANEL
ALT: 15 U/L (ref 0–44)
AST: 15 U/L (ref 15–41)
Albumin: 3.6 g/dL (ref 3.5–5.0)
Alkaline Phosphatase: 75 U/L (ref 38–126)
Anion gap: 10 (ref 5–15)
BUN: 17 mg/dL (ref 6–20)
CO2: 25 mmol/L (ref 22–32)
Calcium: 9.2 mg/dL (ref 8.9–10.3)
Chloride: 107 mmol/L (ref 98–111)
Creatinine, Ser: 0.75 mg/dL (ref 0.44–1.00)
GFR calc Af Amer: >60 mL/min (ref >60)
GFR calc non Af Amer: >60 mL/min (ref >60)
Glucose, Bld: 177 mg/dL — ABNORMAL HIGH (ref 70–99)
Potassium: 3.9 mmol/L (ref 3.5–5.1)
Sodium: 142 mmol/L (ref 135–145)
Total Bilirubin: 0.4 mg/dL (ref 0.3–1.2)
Total Protein: 6.8 g/dL (ref 6.5–8.1)

## 2020-01-17 MED ORDER — CEPHALEXIN 250 MG PO CAPS
500.0000 mg | ORAL_CAPSULE | Freq: Once | ORAL | Status: AC
  Administered 2020-01-17: 500 mg via ORAL
  Filled 2020-01-17: qty 2

## 2020-01-17 MED ORDER — IBUPROFEN 800 MG PO TABS
800.0000 mg | ORAL_TABLET | Freq: Once | ORAL | Status: DC
  Filled 2020-01-17: qty 1

## 2020-01-17 MED ORDER — CEPHALEXIN 500 MG PO CAPS: 500.0000 mg | ORAL_CAPSULE | Freq: Four times a day (QID) | ORAL | 0 refills | Status: AC

## 2020-01-17 MED ORDER — SODIUM CHLORIDE 0.9% FLUSH: 3.0000 mL | Freq: Once | INTRAVENOUS | Status: DC

## 2020-01-17 MED ORDER — ACETAMINOPHEN 500 MG PO TABS
1000.0000 mg | ORAL_TABLET | Freq: Once | ORAL | Status: AC
  Administered 2020-01-17: 1000 mg via ORAL
  Filled 2020-01-17: qty 2

## 2020-01-17 NOTE — Discharge Instructions (Signed)
You have skin changes secondary to inflammation infection of the follicles leading to infection of the skin.  Please take antibiotics as prescribed.  Follow-up closely with your doctor for further management.  Return if you have any concern.

## 2020-01-17 NOTE — ED Triage Notes (Addendum)
Pt here for eval of redness and swelling to right leg. Had blood clot removed from right leg in February. Wound nurse shaved the leg and it became red around wound a few days ago. Today has acute onset of swelling around surgical site. Pt is on eliquious

## 2020-01-17 NOTE — ED Provider Notes (Signed)
[facility] EMERGENCY DEPARTMENT Provider Note   CSN: [id number] Arrival date & time: 01/17/20  1306     History Chief Complaint  Patient presents with  . Leg Swelling    [name] is a 54 y.o. female.  The history is provided by the patient and medical records. No language interpreter was used.     54 year old female with history of diabetes, previously treated for acute ischemia of the right lower extremity requiring lower extremity thromboembolectomy and 4 compartment fasciotomy by Dr. Donzetta Matters in March of this year presenting complaining of redness to her leg.  She is currently on Eliquis.  Patient reports she has a wound care specialist that comes out to her house daily to change her dressing.  Several days ago the wound care nurse was shaving her skin near the wound site for better adhesion of the dressing.  And 2 days later, care nurse noticed redness at the same site and encourage patient to come to ER for further evaluation.  Patient otherwise states that she does not have any fever, worsening pain, numbness, chest pain, trouble breathing.  She has been compliant with her Eliquis.  She denies any weakness of her leg.  She does not have any other symptoms.  She is up-to-date with tetanus.  Past Medical History:  Diagnosis Date  . Allergy   . Anxiety   . Asthma   . Depression   . Diabetes mellitus without complication [facility])     Patient Active Problem List   Diagnosis Date Noted  . Cellulitis of right leg 11/13/2019  . Abscess of deep perineal space 10/03/2019  . Sepsis ([place]) 10/03/2019  . Uncontrolled type 2 diabetes mellitus with hyperosmolar nonketotic hyperglycemia ([place]) 10/03/2019  . Delirium 10/03/2019  . Hypernatremia 10/03/2019  . AKI (acute kidney injury) ([place])   . High anion gap metabolic acidosis   . Type 2 diabetes mellitus without complication ([place]) 86/75/4492  . Elevated cholesterol with elevated triglycerides 04/03/2015  . Chronic  bronchitis ([place]) 12/31/2014  . Smoking 01/22/2013  . Mood disorder ([place]) 06/12/2012  . Bronchospasm 06/12/2012    Past Surgical History:  Procedure Laterality Date  . APPLICATION OF WOUND VAC Right 10/04/2019   Procedure: Application Of Wound Vac;  Surgeon: Waynetta Sandy, MD;  Location: [facility];  Service: Vascular;  Laterality: Right;  . APPLICATION OF WOUND VAC Right 10/06/2019   Procedure: Application Of Wound Vac;  Surgeon: Angelia Mould, MD;  Location: [place];  Service: Vascular;  Laterality: Right;  . CHOLECYSTECTOMY    . FASCIOTOMY CLOSURE Right 10/06/2019   Procedure: FASCIOTOMY CLOSURE Right Lateral Lower leg.;  Surgeon: Angelia Mould, MD;  Location: [place];  Service: Vascular;  Laterality: Right;  . THIGH FASCIOTOMY Right 10/04/2019   Procedure: Right leg Fasciotomy;  Surgeon: Waynetta Sandy, MD;  Location: [place];  Service: Vascular;  Laterality: Right;  . THROMBECTOMY FEMORAL ARTERY Right 10/04/2019   Procedure: RIGHT LEG THROMBECTOMY;  Surgeon: Waynetta Sandy, MD;  Location: [place];  Service: Vascular;  Laterality: Right;  . TUBAL LIGATION       OB History   No obstetric history on file.     Family History  Problem Relation Age of Onset  . Stroke Mother   . Heart disease Father     Social History   Tobacco Use  . Smoking status: Current Every Day Smoker    Packs/day: 1.00    Years: 34.00    Pack  years: 34.00    Types: Cigarettes  . Smokeless tobacco: Never Used  Substance Use Topics  . Alcohol use: No    Alcohol/week: 0.0 standard drinks  . Drug use: No    Home Medications Prior to Admission medications   Medication Sig Start Date End Date Taking? Authorizing Provider  albuterol (VENTOLIN HFA) 108 (90 Base) MCG/ACT inhaler INHALE TWO PUFFS BY MOUTH 4 TIMES DAILY AS NEEDED.  Pt prefers ventolin brand Patient taking differently: Inhale 2 puffs into the lungs every 6 (six) hours as needed for wheezing or shortness  of breath.  10/12/19   Raiford Noble Latif, DO  apixaban (ELIQUIS) 5 MG TABS tablet Take 1 tablet (5 mg total) by mouth 2 (two) times daily. 11/15/19   Elam Dutch, MD  atorvastatin (LIPITOR) 10 MG tablet Take 1 tablet (10 mg total) by mouth daily. 11/23/15   Leandrew Koyanagi, MD  beclomethasone (QVAR) 40 MCG/ACT inhaler Inhale 1 puff into the lungs 2 (two) times daily. Patient not taking: Reported on 11/13/2019 10/12/19   Raiford Noble Latif, DO  blood glucose meter kit and supplies KIT Dispense based on patient and insurance preference. Use up to four times daily as directed. (FOR ICD-9 250.00, 250.01). 04/02/15   McVeigh, Sherren Mocha, PA  EPINEPHrine (PRIMATENE MIST) 0.125 MG/ACT AERO Inhale 2 puffs into the lungs every 4 (four) hours as needed (BREATHING ISSUES).    [provider]  Insulin Isophane & Regular Human (NOVOLIN 70/30 FLEXPEN RELION) (70-30) 100 UNIT/ML PEN Inject 10 Units into the skin in the morning and at bedtime. 10/12/19   Raiford Noble Latif, DO  Insulin Pen Needle 32G X 4 MM MISC 10 Units by Does not apply route in the morning and at bedtime. 10/12/19   Sheikh, Omair Latif, DO  levofloxacin (LEVAQUIN) 500 MG tablet levofloxacin 500 mg tablet  TAKE 1 TABLET BY MOUTH ONCE DAILY FOR 14 DAYS    [provider]  lisinopril (ZESTRIL) 2.5 MG tablet Take 1 tablet (2.5 mg total) by mouth daily. 10/12/19   Raiford Noble Latif, DO  loratadine (CLARITIN) 10 MG tablet Take 10 mg by mouth daily.    [provider]  metFORMIN (GLUCOPHAGE) 500 MG tablet Take 2 tablets (1,000 mg total) by mouth 2 (two) times daily with a meal. 10/12/19   Sheikh, Omair Latif, DO  montelukast (SINGULAIR) 10 MG tablet Take 1 tablet (10 mg total) by mouth at bedtime. 10/12/19   Raiford Noble Latif, DO  oxyCODONE-acetaminophen (PERCOCET) 7.5-325 MG tablet Take 1 tablet by mouth every 6 (six) hours as needed for severe pain. 11/15/19 11/14/20  Setzer, Edman Circle, PA-C  oxyCODONE-acetaminophen  (PERCOCET/ROXICET) 5-325 MG tablet Take 1 tablet by mouth every 4 (four) hours as needed for severe pain. 12/01/19   Waynetta Sandy, MD  pantoprazole (PROTONIX) 40 MG tablet Take 1 tablet (40 mg total) by mouth daily. 10/13/19   Sheikh, Omair Latif, DO  QVAR REDIHALER 40 MCG/ACT inhaler Inhale 1 puff into the lungs daily.  10/12/19   [provider]  sertraline (ZOLOFT) 50 MG tablet Take 3 tablets (150 mg total) by mouth daily. 10/13/19   Raiford Noble Latif, DO    Allergies    Codeine and Guaifenesin  Review of Systems   Review of Systems  Constitutional: Negative for fever.  Skin: Positive for rash.  Neurological: Negative for numbness.  All other systems reviewed and are negative.   Physical Exam Updated Vital Signs BP 131/82 (BP Location: Left Arm)  Pulse 84   Temp 98.1 F (36.7 C) (Oral)   Resp 16   Ht '5\' 6"'  (1.676 m)   Wt 113.4 kg   SpO2 98%   BMI 40.35 kg/m   Physical Exam Vitals and nursing note reviewed.  Constitutional:      General: She is not in acute distress.    Appearance: She is well-developed. She is obese.  HENT:     Head: Atraumatic.  Eyes:     Conjunctiva/sclera: Conjunctivae normal.  Cardiovascular:     Rate and Rhythm: Normal rate and regular rhythm.     Pulses: Normal pulses.     Heart sounds: Normal heart sounds.  Pulmonary:     Effort: Pulmonary effort is normal.     Breath sounds: Normal breath sounds. No wheezing or rales.  Musculoskeletal:     Cervical back: Neck supple.  Skin:    Capillary Refill: Capillary refill takes less than 2 seconds.     Findings: Rash (Right lower extremity: There is a well-healing wound with good granular tissue noted to the medial side of the tib-fib.  Surrounding skin irritation with erythema and minimal warmth and swelling noted.  1+ pitting edema to dorsum of foot.) present.  Neurological:     Mental Status: She is alert and oriented to person, place, and time.  Psychiatric:        Mood  and Affect: Mood normal.     ED Results / Procedures / Treatments   Labs (all labs ordered are listed, but only abnormal results are displayed) Labs Reviewed  COMPREHENSIVE METABOLIC PANEL - Abnormal; Notable for the following components:      Result Value   Glucose, Bld 177 (*)    All other components within normal limits  CBC WITH DIFFERENTIAL/PLATELET  I-STAT BETA HCG BLOOD, ED ([facility], [facility], [facility] ONLY)    EKG None  Radiology No results found.  Procedures Procedures (including critical care time)  Medications Ordered in ED Medications  sodium chloride flush (NS) 0.9 % injection 3 mL (has no administration in time range)    ED Course  I have reviewed the triage vital signs and the nursing notes.  Pertinent labs & imaging results that were available during my care of the patient were reviewed by me and considered in my medical decision making (see chart for details).    MDM Rules/Calculators/A&P                     BP 129/78   Pulse 69   Temp 98.1 F (36.7 C) (Oral)   Resp 16   Ht '5\' 6"'  (1.676 m)   Wt 113.4 kg   SpO2 97%   BMI 40.35 kg/m   Final Clinical Impression(s) / ED Diagnoses Final diagnoses:  Folliculitis    Rx / DC Orders ED Discharge Orders         Ordered    cephALEXin (KEFLEX) 500 MG capsule  4 times daily     01/17/20 1623         3:36 PM Patient here with concerns of infection of her right lower extremity after her nurse wound care specialist shaved her leg near the wound site several days prior.  Examination reveals erythema mild warmth and skin irritation surrounding her chronic wound.  No obvious abscess appreciated.  Finding the skin is consistent with folliculitis.  I discussed care with Dr. Maryan Rued who has also evaluated patient and agrees.  Will treat with Keflex.  Return cautions  discussed.   Domenic Moras, PA-C 01/17/20 1625    Blanchie Dessert, MD 01/18/20 2030

## 2020-02-01 ENCOUNTER — Telehealth (HOSPITAL_COMMUNITY): Payer: Self-pay

## 2020-02-01 NOTE — Telephone Encounter (Signed)

## 2020-02-02 ENCOUNTER — Other Ambulatory Visit: Payer: Self-pay

## 2020-02-02 ENCOUNTER — Ambulatory Visit (INDEPENDENT_AMBULATORY_CARE_PROVIDER_SITE_OTHER)
Admission: RE | Admit: 2020-02-02 | Discharge: 2020-02-02 | Disposition: A | Discharge status: Home / Self Care | Payer: Self-pay | Source: Ambulatory Visit | Attending: Vascular Surgery | Admitting: Vascular Surgery

## 2020-02-02 ENCOUNTER — Ambulatory Visit (HOSPITAL_COMMUNITY)
Admission: RE | Admit: 2020-02-02 | Discharge: 2020-02-02 | Disposition: A | Discharge status: Home / Self Care | Payer: Self-pay | Source: Ambulatory Visit | Attending: Vascular Surgery | Admitting: Vascular Surgery

## 2020-02-02 ENCOUNTER — Ambulatory Visit (INDEPENDENT_AMBULATORY_CARE_PROVIDER_SITE_OTHER): Payer: Self-pay | Admitting: Physician Assistant

## 2020-02-02 VITALS — BP 137/83 | HR 84 | Temp 97.9°F | Resp 20 | Ht 66.0 in | Wt 252.1 lb

## 2020-02-02 DIAGNOSIS — I998 Other disorder of circulatory system: Secondary | ICD-10-CM
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
## 2020-02-07 ENCOUNTER — Other Ambulatory Visit: Payer: Self-pay | Admitting: *Deleted

## 2020-02-07 DIAGNOSIS — I998 Other disorder of circulatory system: Secondary | ICD-10-CM

## 2020-02-08 ENCOUNTER — Telehealth: Payer: Self-pay

## 2020-02-08 NOTE — Telephone Encounter (Signed)
Pt called to inquire about home health orders that had not been received by the agency. Returned call to pt, then called [facility], message was sent to Ginny/director to return my call. S/w Ginny and clarified that orders were needed for them to continue to go out and see her- order was faxed

## 2020-03-15 ENCOUNTER — Ambulatory Visit: Payer: Self-pay

## 2021-09-25 IMAGING — CT CT ABD-PELV W/ CM
2 of 8 series · 13 of 46 positions shown, 18 images · IV contrast (OMNIPAQUE 300)
Comparison: None.

CLINICAL DATA: Abscess on the labia, irritation

EXAM:
CT ABDOMEN AND PELVIS WITH CONTRAST
TECHNIQUE: Multidetector CT imaging of the abdomen and pelvis was performed
using the standard protocol following bolus administration of
intravenous contrast.
CONTRAST:  100mL OMNIPAQUE IOHEXOL 300 MG/ML  SOLN

[Series 2: axial st · axial · 0.76mm/px · z∈[+1225,+1620]mm · 10 of 93 slices shown, 15 images]
[im 7/93  soft-tissue]
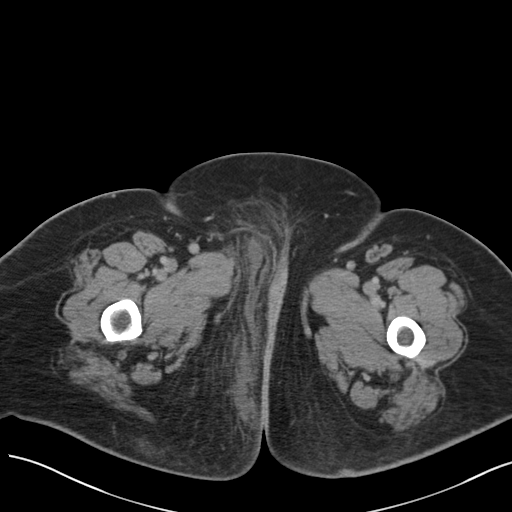
[im 7/93  bone]
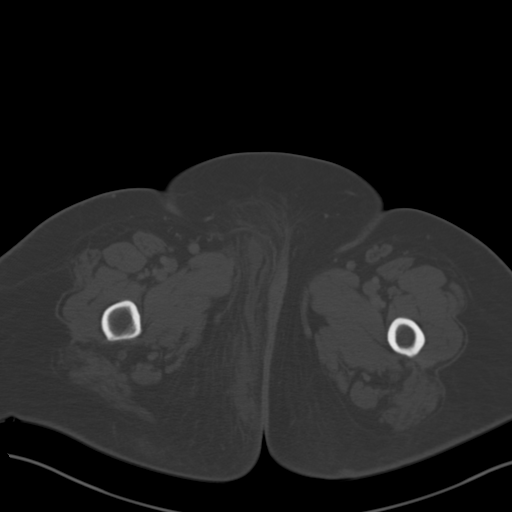
[im 19/93  soft-tissue]
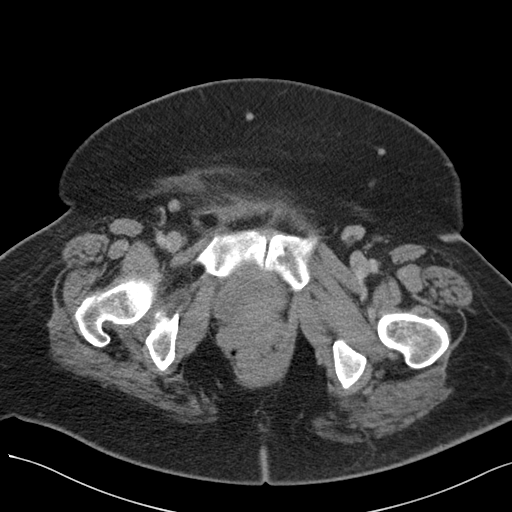
[im 25/93  soft-tissue]
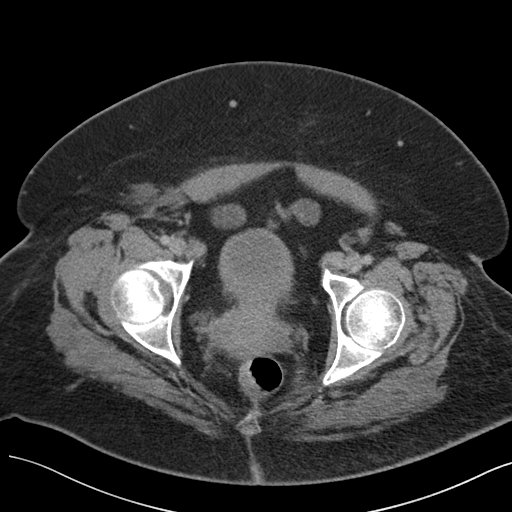
[im 37/93  soft-tissue]
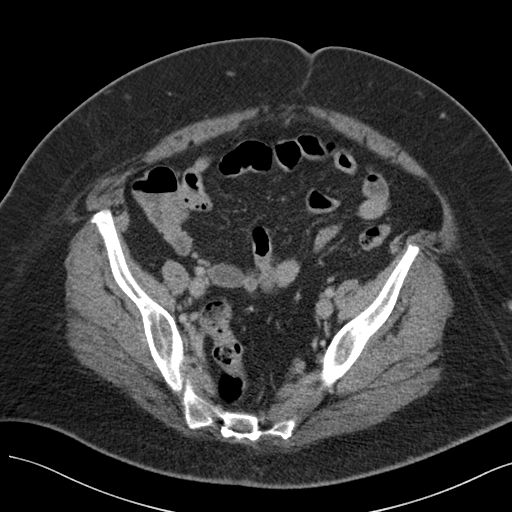
[im 50/93  soft-tissue]
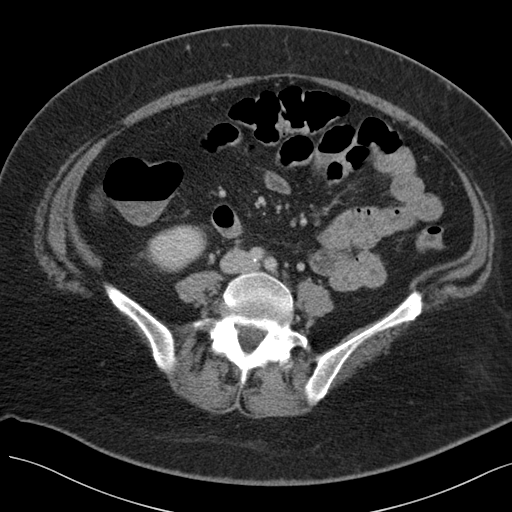
[im 56/93  soft-tissue]
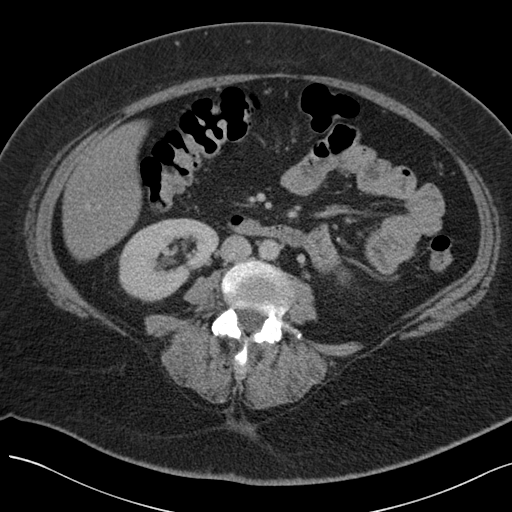
[im 68/93  soft-tissue]
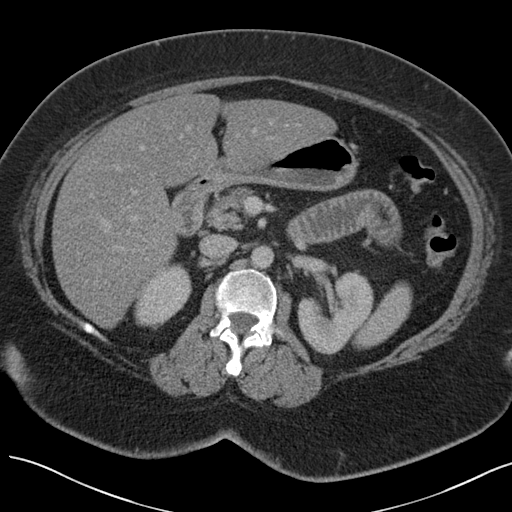
[im 68/93  lung]
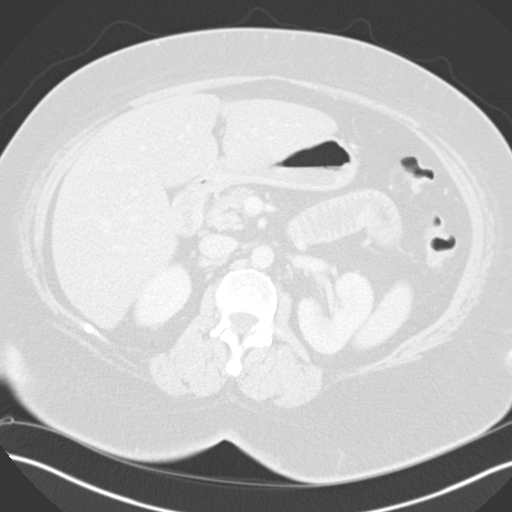
[im 74/93  soft-tissue]
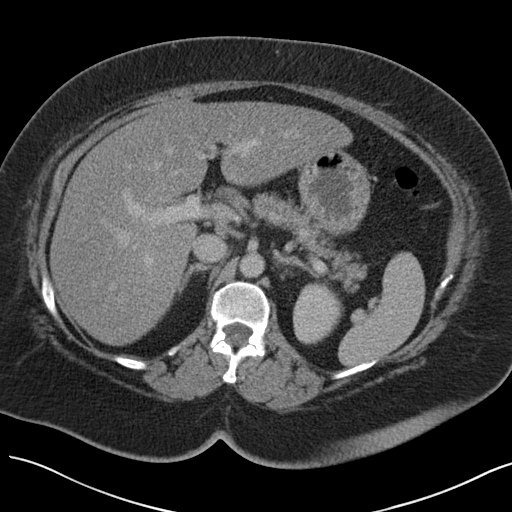
[im 74/93  lung]
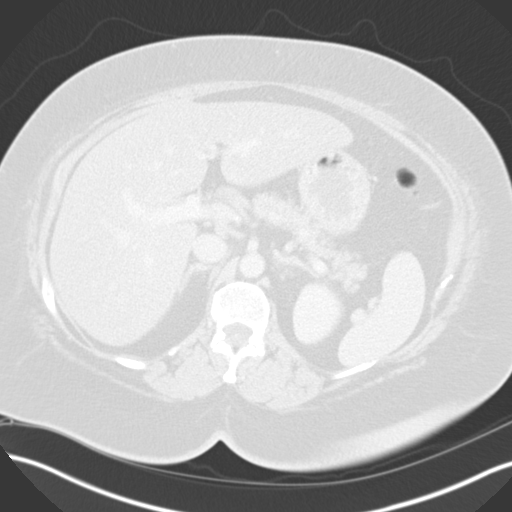
[im 80/93  lung]
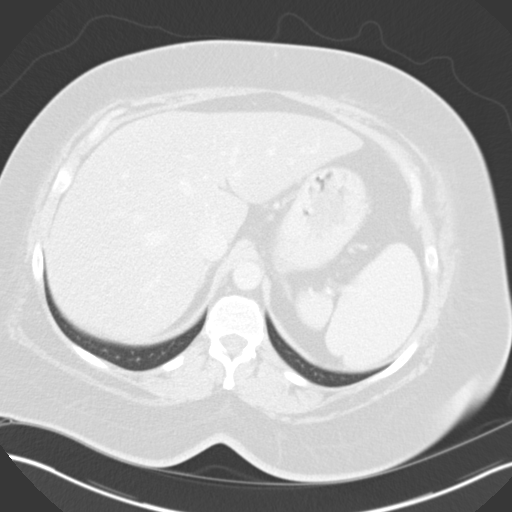
[im 86/93  soft-tissue]
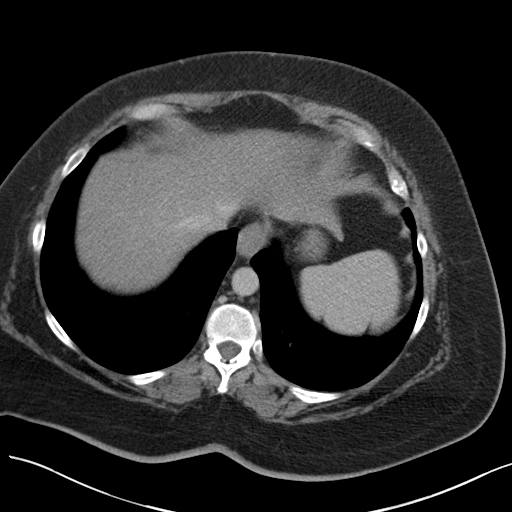
[im 86/93  lung]
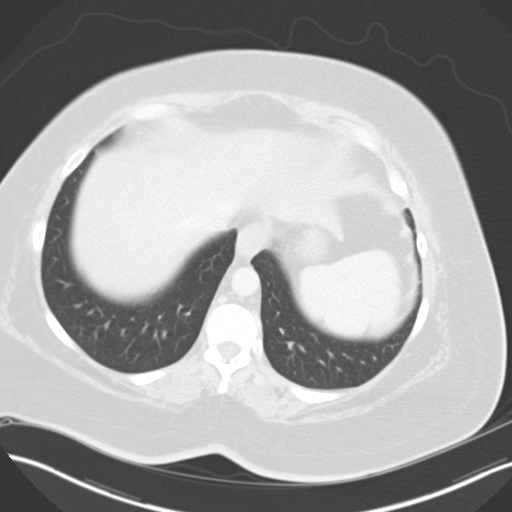
[im 86/93  bone]
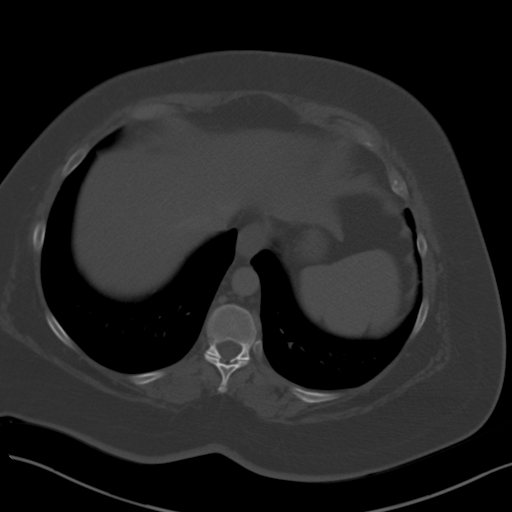

[Series 5: coronal st · coronal · 0.82mm/px · 3 of 163 slices shown]
[im 33/163  soft-tissue]
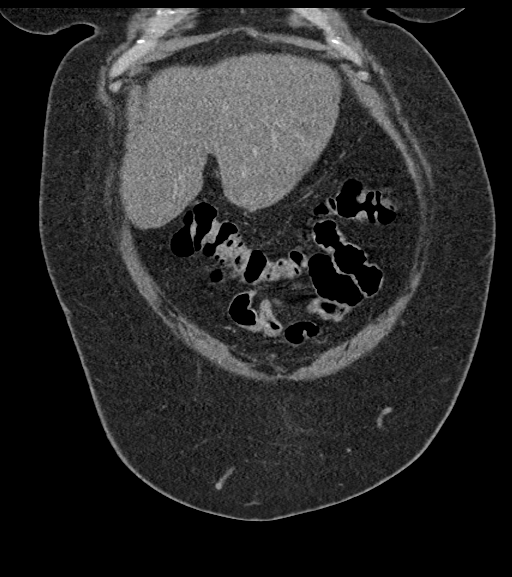
[im 65/163  soft-tissue]
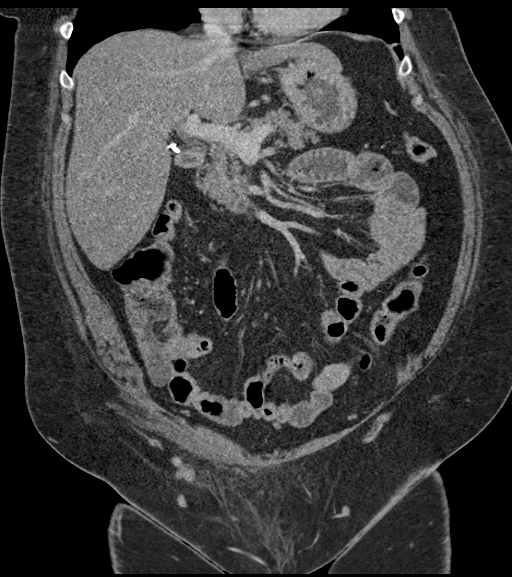
[im 98/163  soft-tissue]
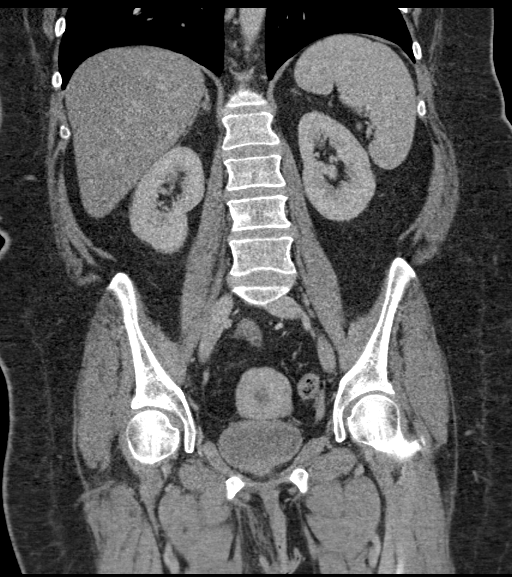

[13 of 46 positions shown; findings below may reference images not displayed]

FINDINGS: Lower chest: The visualized heart size within normal limits. No
pericardial fluid/thickening.

No hiatal hernia.

The visualized portions of the lungs are clear.

Hepatobiliary: The liver is normal in density without focal
abnormality.The main portal vein is patent. The patient is status
post cholecystectomy. No biliary ductal dilation.

Pancreas: Unremarkable. No pancreatic ductal dilatation or
surrounding inflammatory changes.

Spleen: Normal in size without focal abnormality.

Adrenals/Urinary Tract: Both adrenal glands appear normal. The
kidneys and collecting system appear normal without evidence of
urinary tract calculus or hydronephrosis. Bladder is unremarkable.

Stomach/Bowel: The stomach, small bowel, and colon are normal in
appearance. No inflammatory changes, wall thickening, or obstructive
findings.The appendix is normal.

Vascular/Lymphatic: There are no enlarged mesenteric,
retroperitoneal, or pelvic lymph nodes. No significant vascular
findings are present.

Reproductive: The uterus and adnexa are unremarkable.

Other: Along the right labial and perineal soft tissues there is a
extensive amount of soft tissue thickening and fat stranding changes
which extend into the right medial buttocks and anterior labral soft
tissues. A loculated fluid collection seen within the left posterior
labial fold measuring 5.3 x 1.6 by 2.1 cm. There are small foci of
air either seen adjacent to or within the collection. No
subcutaneous emphysema seen within the surrounding soft tissues
however. There is non loculated fluid which extends anteriorly into
the left labial fold. For

Musculoskeletal: No acute or significant osseous findings.
IMPRESSION: Posterior left labial/perineal soft tissue abscess measuring 5.3 x
1.6 x 2.1 cm with significant surrounding inflammatory changes.
There is non loculated fluid/phlegmon which extends anteriorly
within the labial fold. No subcutaneous emphysema however is noted.

## 2021-09-30 IMAGING — CT CT ABD-PELV W/O CM
2 of 4 series · 15 of 46 positions shown, 17 images · non-contrast
Comparison: Contrast-enhanced CT 5 days ago

CLINICAL DATA: Nominal distension.

EXAM:
CT ABDOMEN AND PELVIS WITHOUT CONTRAST
TECHNIQUE: Multidetector CT imaging of the abdomen and pelvis was performed
following the standard protocol without IV contrast.

[Series 3: a/p w/o 5mm · axial · non-contrast · 0.98mm/px · z∈[+845,+1245]mm · 12 of 92 slices shown, 14 images]
[im 8/92  soft-tissue]
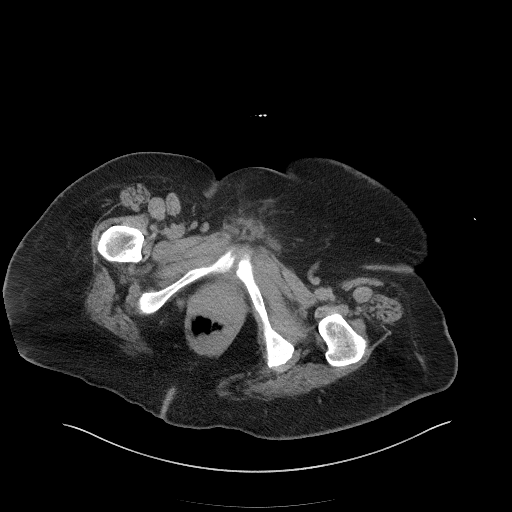
[im 8/92  bone]
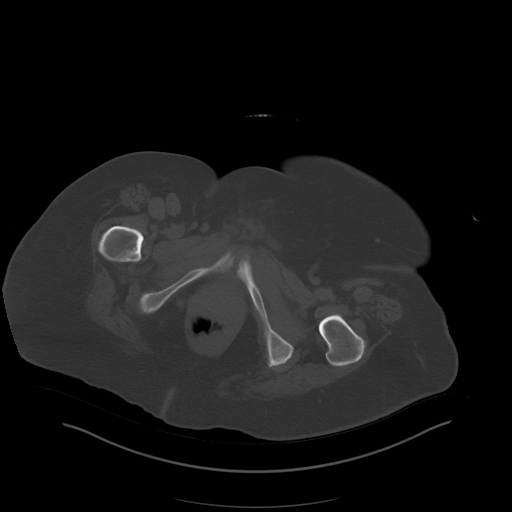
[im 15/92  soft-tissue]
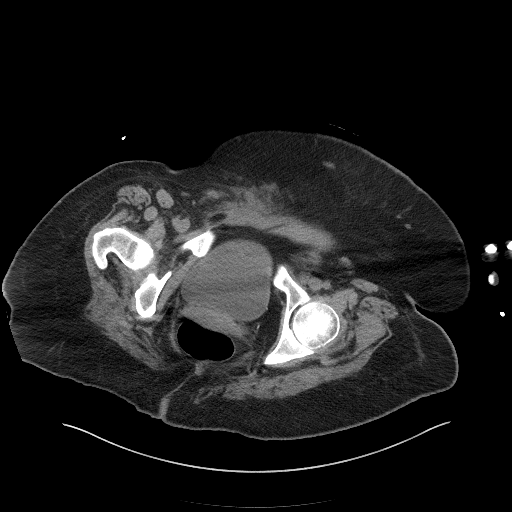
[im 22/92  soft-tissue]
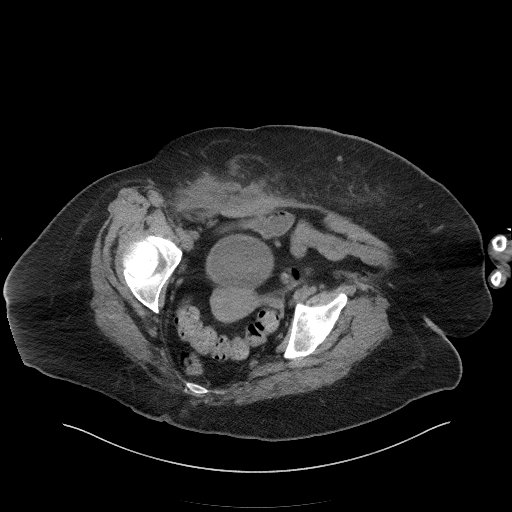
[im 30/92  soft-tissue]
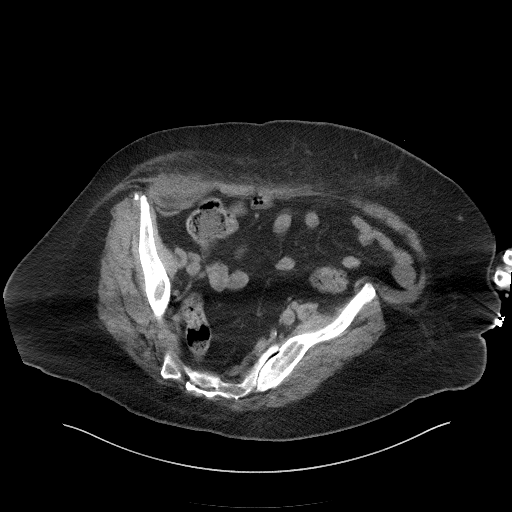
[im 37/92  soft-tissue]
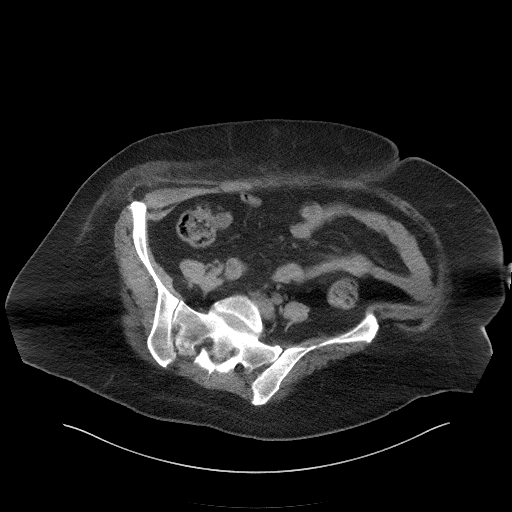
[im 44/92  soft-tissue]
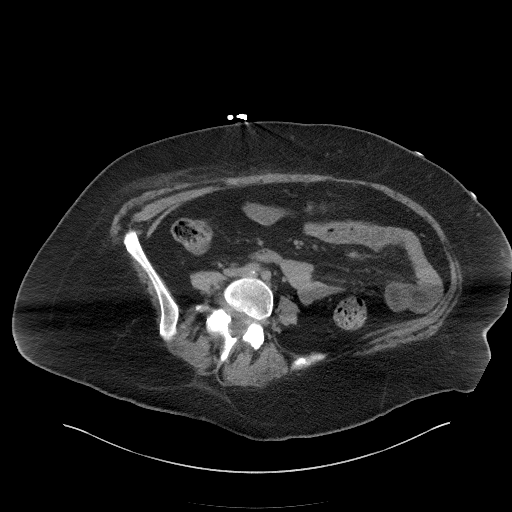
[im 51/92  soft-tissue]
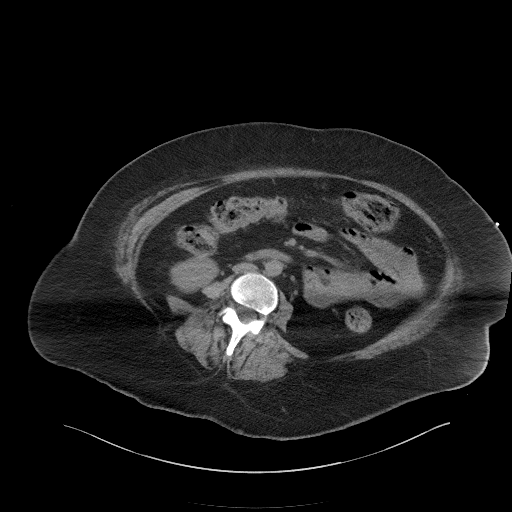
[im 59/92  soft-tissue]
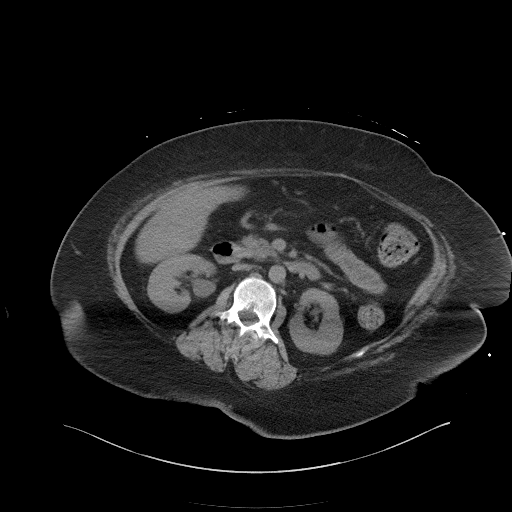
[im 66/92  soft-tissue]
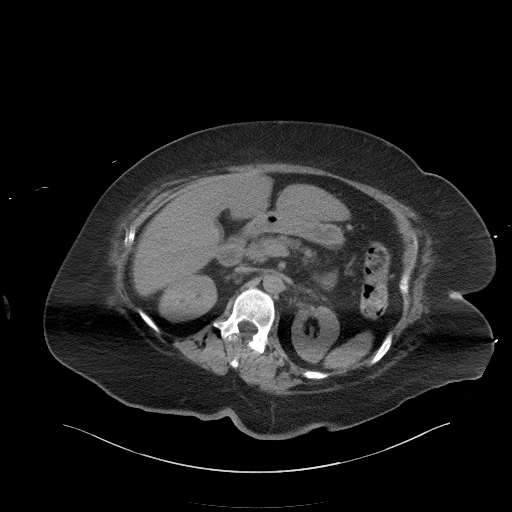
[im 66/92  bone]
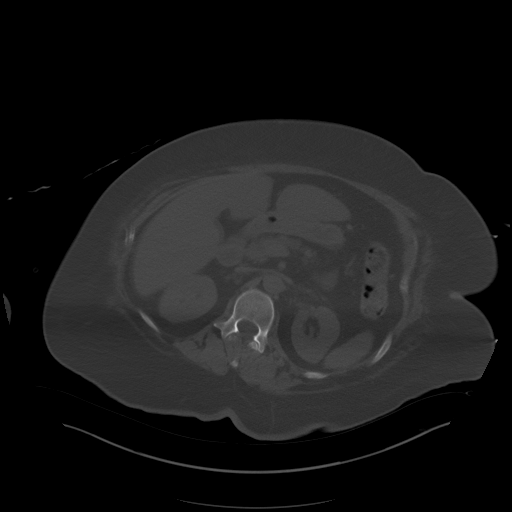
[im 73/92  soft-tissue]
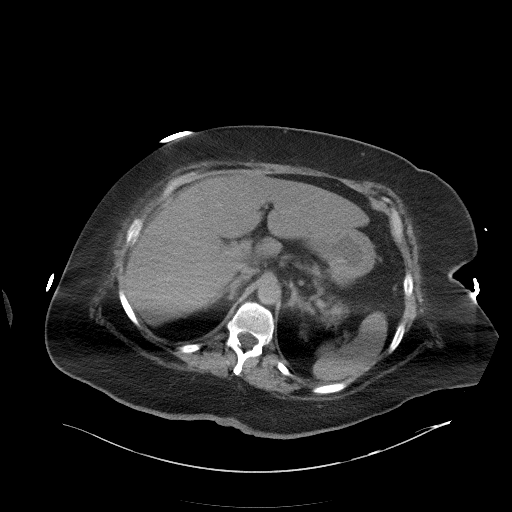
[im 81/92  soft-tissue]
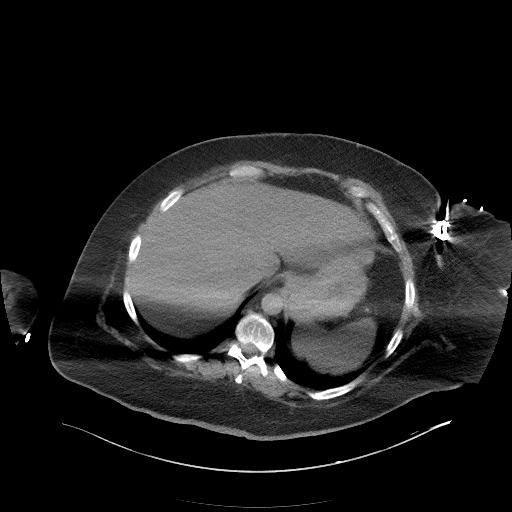
[im 88/92  soft-tissue]
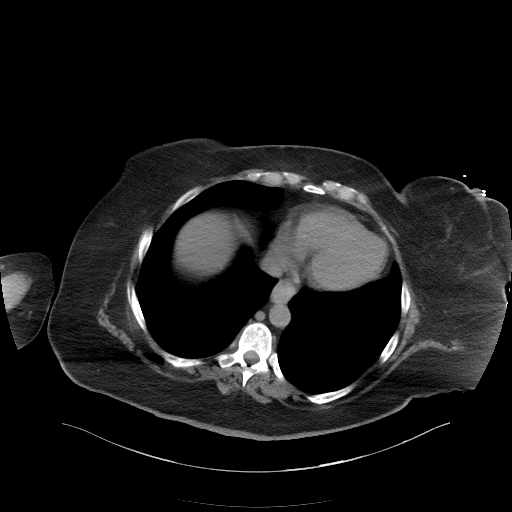

[Series 6: a/p w/o cor · coronal · non-contrast · 0.89mm/px · 3 of 156 slices shown]
[im 52/156  soft-tissue]
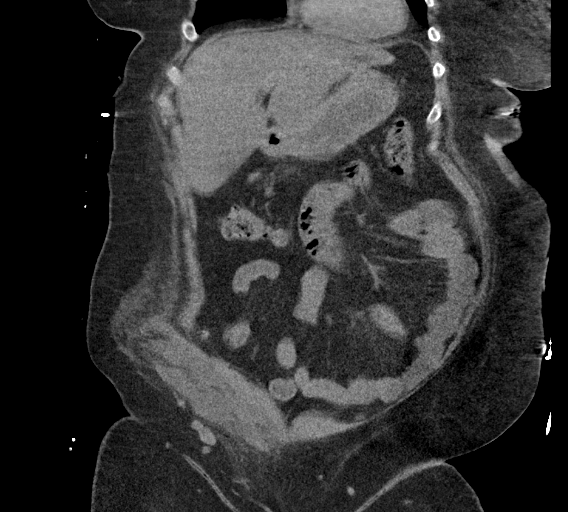
[im 69/156  soft-tissue]
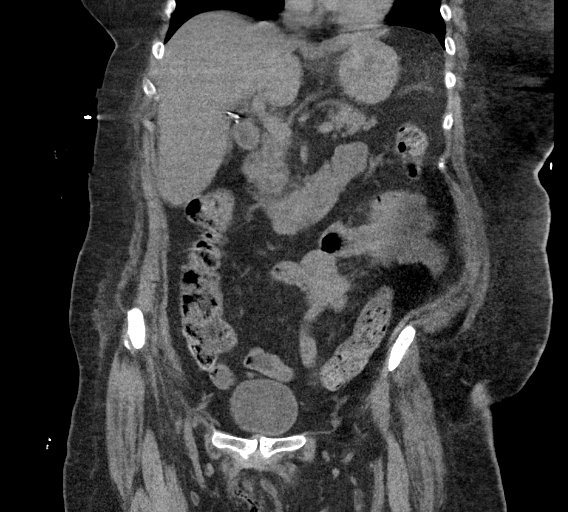
[im 87/156  soft-tissue]
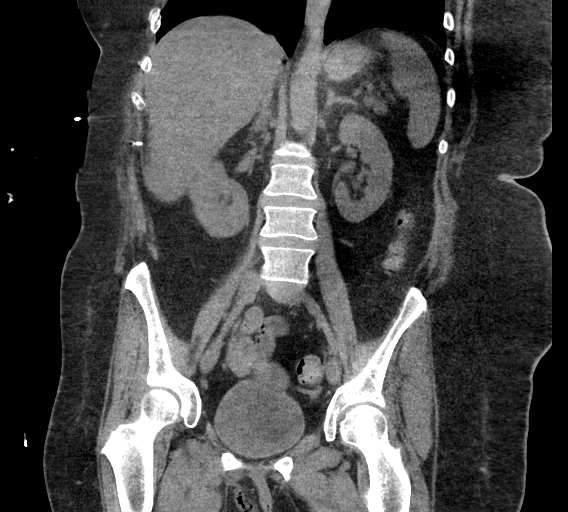

[15 of 46 positions shown; findings below may reference images not displayed]

FINDINGS: Lower chest: No consolidation or pleural fluid. Breathing motion
artifact limits detailed assessment.

Hepatobiliary: Mild hepatic steatosis without focal lesion. Clips in
the gallbladder fossa postcholecystectomy. No biliary dilatation.

Pancreas: No ductal dilatation or inflammation.

Spleen: Normal in size without focal abnormality.

Adrenals/Urinary Tract: Normal adrenal glands. No hydronephrosis or
perinephric edema. No renal or ureteral calculi. Urinary bladder is
physiologically distended. No bladder wall thickening.

Stomach/Bowel: Bowel evaluation limited in the absence of contrast
and patient motion. Nondistended stomach. Few prominent fluid-filled
small bowel without obstruction or inflammation. Normal appendix.
Moderate colonic stool burden without inflammation.

Vascular/Lymphatic: Abdominal aorta is normal in caliber. Minor
aortic atherosclerosis. Prominent bilateral external iliac and
inguinal nodes.

Reproductive: Uterus and bilateral adnexa are unremarkable.

Other: Previous labial and right perineal soft tissue thickening has
improved, however there is patchy soft tissue air in the right
perineum, not entirely included in the field of view. This tracks
anteriorly towards the right inguinal region. Heterogeneous
stranding and induration of the anterior abdominal wall superficial
to the abdominal wall musculature with ill-defined fluid measuring
approximately 6.1 by 2.2 cm, series 3, image 73. Lack of contrast
limits more detailed evaluation. There is no intra-abdominal free
air free fluid. Small fat containing umbilical hernia.

Musculoskeletal: Facet hypertrophy in the lower lumbar spine. There
are no acute or suspicious osseous abnormalities.
IMPRESSION: 1. Soft tissue gas in the right labia and perineum. Labial
induration on CT 5 days ago has improved. This area is not entirely
included in the field of view. Cannot differentiate soft tissue
changes/air from recent incision and drainage versus necrotizing
soft tissue infection.
2. There is new subcutaneous induration and inflammatory changes
involving the right anterior abdominal wall with ill-defined fluid
measuring approximately 6.1 x 2.2 cm. This is suspicious for soft
tissue infection with phlegmonous or early abscess changes.
Inflammatory changes remains superficial to the anterior abdominal
wall.
3. Prominent bilateral external iliac and inguinal nodes are likely
reactive.
4. Mild hepatic steatosis.

These results were called by telephone at the time of interpretation
on 10/03/2019 at [DATE] to PA JULIO C DEANE , who verbally
acknowledged these results.

Aortic Atherosclerosis ([id number]).

## 2021-10-04 IMAGING — DX DG CHEST 1V PORT
1 series · 1 of 1 positions shown · non-contrast
Comparison: 10/05/2019 chest radiograph.

CLINICAL DATA: Acute respiratory failure

EXAM:
PORTABLE CHEST 1 VIEW

[chest ap]
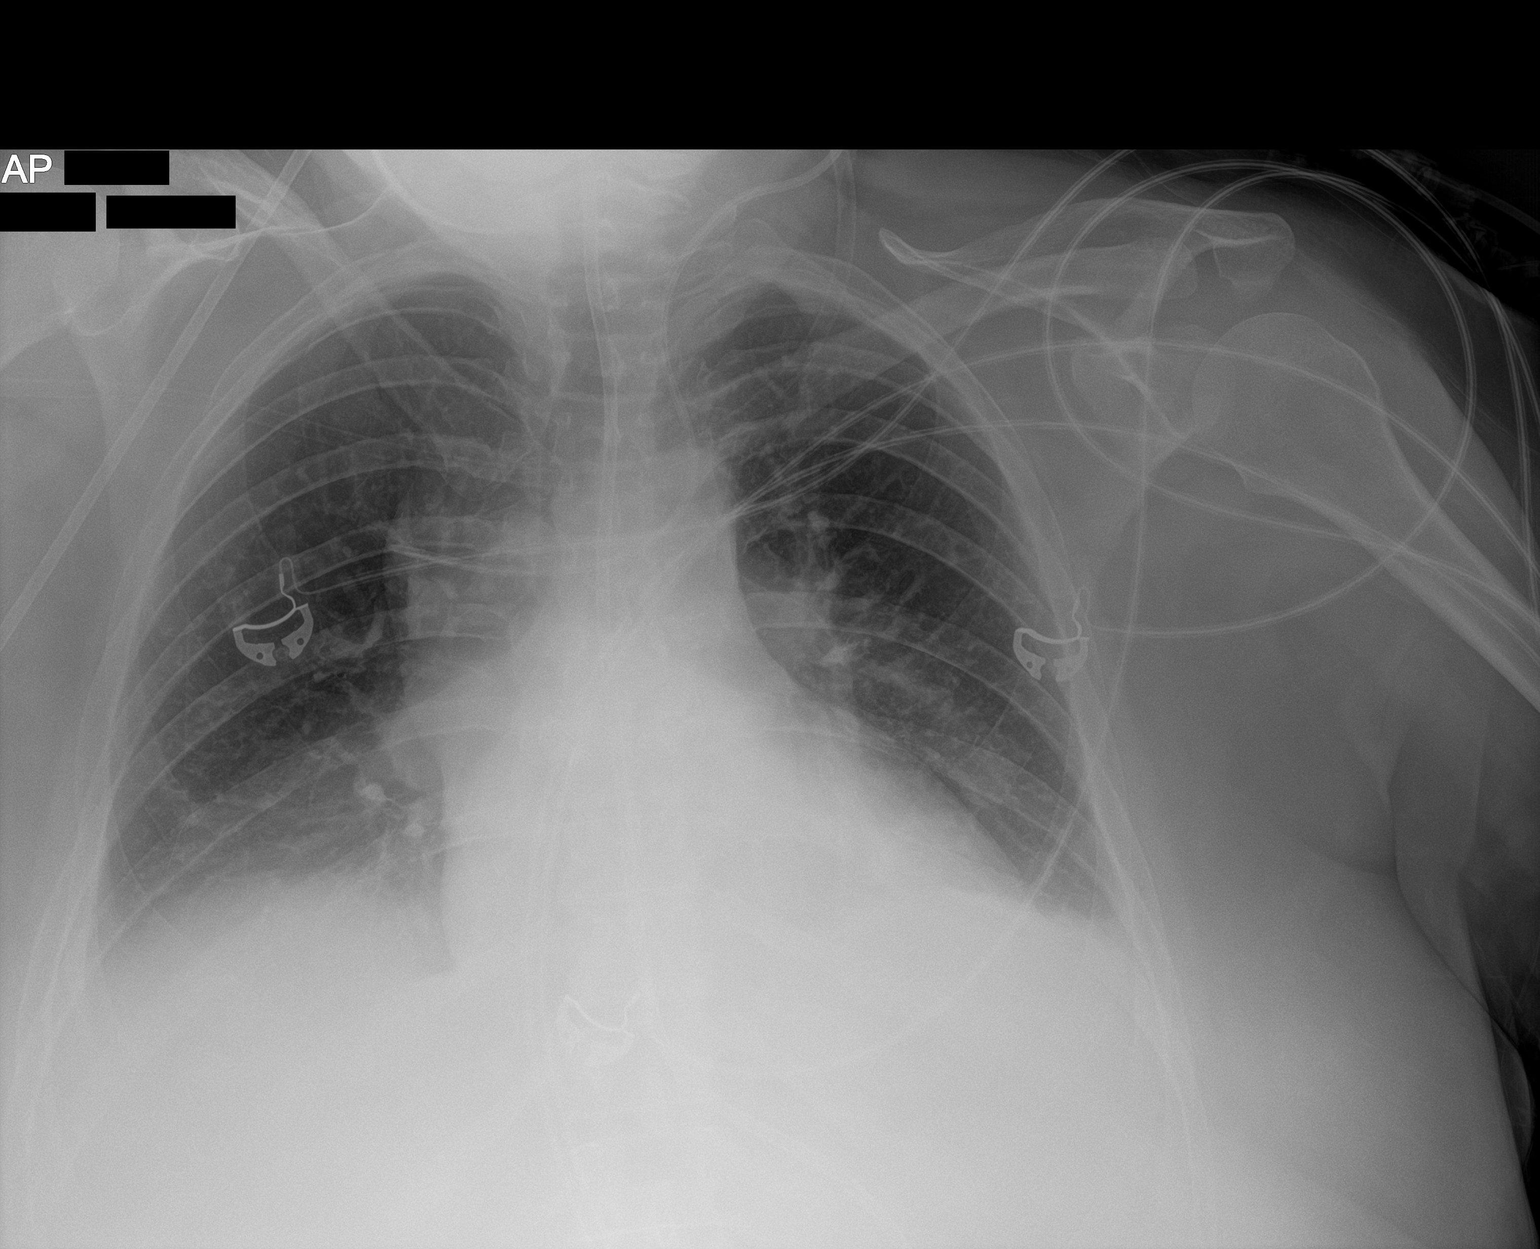

[1 of 1 positions shown; findings below may reference images not displayed]

FINDINGS: Enteric tube enters stomach with the tip not seen on this image.
Left internal jugular central venous catheter terminates in upper
right mediastinum. Stable cardiomediastinal silhouette with
top-normal heart size. No pneumothorax. Small bilateral pleural
effusions are unchanged. Stable mild bibasilar atelectasis. No overt
pulmonary edema. Low lung volumes.
IMPRESSION: Stable low lung volumes with small bilateral pleural effusions and
mild bibasilar atelectasis.

## 2023-07-20 ENCOUNTER — Other Ambulatory Visit: Payer: Self-pay | Admitting: Physician Assistant

## 2023-07-20 DIAGNOSIS — Z1231 Encounter for screening mammogram for malignant neoplasm of breast: Secondary | ICD-10-CM

## 2023-08-19 ENCOUNTER — Ambulatory Visit: Payer: Commercial Managed Care - HMO
